# Patient Record
Sex: Male | Born: 1980
Health system: Southern US, Community
[De-identification: ages and names within clinical notes are randomized; demographics above are authoritative.]

## PROBLEM LIST (undated history)

## (undated) DIAGNOSIS — M549 Dorsalgia, unspecified: Secondary | ICD-10-CM

## (undated) DIAGNOSIS — M1990 Unspecified osteoarthritis, unspecified site: Secondary | ICD-10-CM

## (undated) DIAGNOSIS — M792 Neuralgia and neuritis, unspecified: Secondary | ICD-10-CM

## (undated) DIAGNOSIS — M9110 Juvenile osteochondrosis of head of femur [Legg-Calve-Perthes], unspecified leg: Secondary | ICD-10-CM

## (undated) DIAGNOSIS — E669 Obesity, unspecified: Secondary | ICD-10-CM

## (undated) DIAGNOSIS — E139 Other specified diabetes mellitus without complications: Secondary | ICD-10-CM

## (undated) HISTORY — PX: LEG SURGERY: SHX1003

---

## 2008-03-09 ENCOUNTER — Ambulatory Visit: Payer: Self-pay | Admitting: Radiology

## 2008-03-09 ENCOUNTER — Emergency Department (HOSPITAL_BASED_OUTPATIENT_CLINIC_OR_DEPARTMENT_OTHER): Admission: EM | Admit: 2008-03-09 | Discharge: 2008-03-09 | Payer: Self-pay | Admitting: Emergency Medicine

## 2008-03-20 ENCOUNTER — Emergency Department (HOSPITAL_BASED_OUTPATIENT_CLINIC_OR_DEPARTMENT_OTHER): Admission: EM | Admit: 2008-03-20 | Discharge: 2008-03-20 | Payer: Self-pay | Admitting: Emergency Medicine

## 2008-11-28 ENCOUNTER — Encounter: Admission: RE | Admit: 2008-11-28 | Discharge: 2009-02-26 | Payer: Self-pay

## 2009-05-01 ENCOUNTER — Emergency Department (HOSPITAL_BASED_OUTPATIENT_CLINIC_OR_DEPARTMENT_OTHER): Admission: EM | Admit: 2009-05-01 | Discharge: 2009-05-01 | Payer: Self-pay | Admitting: Emergency Medicine

## 2009-06-27 ENCOUNTER — Emergency Department (HOSPITAL_BASED_OUTPATIENT_CLINIC_OR_DEPARTMENT_OTHER): Admission: EM | Admit: 2009-06-27 | Discharge: 2009-06-27 | Payer: Self-pay | Admitting: Emergency Medicine

## 2010-01-11 ENCOUNTER — Emergency Department (HOSPITAL_BASED_OUTPATIENT_CLINIC_OR_DEPARTMENT_OTHER): Admission: EM | Admit: 2010-01-11 | Discharge: 2010-01-11 | Payer: Self-pay | Admitting: Emergency Medicine

## 2010-05-13 LAB — BASIC METABOLIC PANEL
GFR calc non Af Amer: >60 mL/min (ref >60)
Potassium: 3.8 mEq/L (ref 3.5–5.1)
Sodium: 141 mEq/L (ref 135–145)

## 2010-05-13 LAB — GLUCOSE, CAPILLARY: Glucose-Capillary: 285 mg/dL — ABNORMAL HIGH (ref 70–99)

## 2011-08-24 ENCOUNTER — Emergency Department (HOSPITAL_BASED_OUTPATIENT_CLINIC_OR_DEPARTMENT_OTHER)
Admission: EM | Admit: 2011-08-24 | Discharge: 2011-08-25 | Disposition: A | Discharge status: Home / Self Care | Payer: Self-pay | Attending: Emergency Medicine | Admitting: Emergency Medicine

## 2011-08-24 ENCOUNTER — Encounter (HOSPITAL_BASED_OUTPATIENT_CLINIC_OR_DEPARTMENT_OTHER): Payer: Self-pay | Admitting: *Deleted

## 2011-08-24 DIAGNOSIS — E119 Type 2 diabetes mellitus without complications: Secondary | ICD-10-CM | POA: Insufficient documentation

## 2011-08-24 DIAGNOSIS — E669 Obesity, unspecified: Secondary | ICD-10-CM | POA: Insufficient documentation

## 2011-08-24 DIAGNOSIS — W2209XA Striking against other stationary object, initial encounter: Secondary | ICD-10-CM | POA: Insufficient documentation

## 2011-08-24 DIAGNOSIS — Z881 Allergy status to other antibiotic agents status: Secondary | ICD-10-CM | POA: Insufficient documentation

## 2011-08-24 DIAGNOSIS — L089 Local infection of the skin and subcutaneous tissue, unspecified: Secondary | ICD-10-CM | POA: Insufficient documentation

## 2011-08-24 DIAGNOSIS — M1990 Unspecified osteoarthritis, unspecified site: Secondary | ICD-10-CM | POA: Insufficient documentation

## 2011-08-24 DIAGNOSIS — R739 Hyperglycemia, unspecified: Secondary | ICD-10-CM

## 2011-08-24 HISTORY — DX: Dorsalgia, unspecified: M54.9

## 2011-08-24 HISTORY — DX: Unspecified osteoarthritis, unspecified site: M19.90

## 2011-08-24 HISTORY — DX: Obesity, unspecified: E66.9

## 2011-08-24 HISTORY — DX: Other specified diabetes mellitus without complications: E13.9

## 2011-08-24 HISTORY — DX: Neuralgia and neuritis, unspecified: M79.2

## 2011-08-24 LAB — BASIC METABOLIC PANEL
BUN: 10 mg/dL (ref 6–23)
CO2: 27 mEq/L (ref 19–32)
Chloride: 94 mEq/L — ABNORMAL LOW (ref 96–112)
Creatinine, Ser: 0.6 mg/dL (ref 0.50–1.35)

## 2011-08-24 LAB — DIFFERENTIAL
Lymphocytes Relative: 20 % (ref 12–46)
Monocytes Absolute: 0.8 10*3/uL (ref 0.1–1.0)
Monocytes Relative: 8 % (ref 3–12)
Neutro Abs: 7.7 10*3/uL (ref 1.7–7.7)

## 2011-08-24 LAB — CBC
HCT: 38.8 % — ABNORMAL LOW (ref 39.0–52.0)
Hemoglobin: 13.6 g/dL (ref 13.0–17.0)
WBC: 10.7 10*3/uL — ABNORMAL HIGH (ref 4.0–10.5)

## 2011-08-24 MED ORDER — DEXTROSE 5 % IV SOLN
1.0000 g | INTRAVENOUS | Status: DC
  Administered 2011-08-24: 1 g via INTRAVENOUS
  Filled 2011-08-24: qty 10

## 2011-08-24 MED ORDER — CEPHALEXIN 500 MG PO CAPS: 500.0000 mg | ORAL_CAPSULE | Freq: Four times a day (QID) | ORAL | Status: AC

## 2011-08-24 MED ORDER — SODIUM CHLORIDE 0.9 % IV SOLN
Freq: Once | INTRAVENOUS | Status: AC
  Administered 2011-08-24: 22:00:00 via INTRAVENOUS

## 2011-08-24 MED ORDER — DOXYCYCLINE HYCLATE 100 MG PO CAPS: 100.0000 mg | ORAL_CAPSULE | Freq: Two times a day (BID) | ORAL | Status: AC

## 2011-08-24 MED ORDER — INSULIN REGULAR HUMAN 100 UNIT/ML IJ SOLN
10.0000 [IU] | Freq: Once | INTRAMUSCULAR | Status: AC
  Administered 2011-08-24: 10 [IU] via SUBCUTANEOUS

## 2011-08-24 MED ORDER — VANCOMYCIN HCL IN DEXTROSE 1-5 GM/200ML-% IV SOLN
1000.0000 mg | Freq: Once | INTRAVENOUS | Status: DC
  Filled 2011-08-24: qty 200

## 2011-08-24 MED ORDER — SODIUM CHLORIDE 0.9 % IV SOLN
Freq: Once | INTRAVENOUS | Status: AC
  Administered 2011-08-25: 1000 mL via INTRAVENOUS

## 2011-08-24 MED ORDER — INSULIN REGULAR HUMAN 100 UNIT/ML IJ SOLN
INTRAMUSCULAR | Status: AC
  Filled 2011-08-24: qty 10

## 2011-08-24 MED ORDER — ACETAMINOPHEN 325 MG PO TABS
650.0000 mg | ORAL_TABLET | Freq: Once | ORAL | Status: AC
Start: 2011-08-24 — End: 2011-08-24
  Administered 2011-08-24: 650 mg via ORAL
  Filled 2011-08-24: qty 2

## 2011-08-24 NOTE — ED Notes (Signed)
Pt report that he "skinned" the skin off his right great toe and believes that it is infected.  Pt reports fever today 102.0 and took 1gm tylenol today.  Pt also reports bilateral boils under both arms for about a month also.

## 2011-08-24 NOTE — Discharge Instructions (Signed)
Hyperglycemia Hyperglycemia occurs when the glucose (sugar) in your blood is too high. Hyperglycemia can happen for many reasons, but it most often happens to people who do not know they have diabetes or are not managing their diabetes properly.  CAUSES  Whether you have diabetes or not, there are other causes of hyperglycemia. Hyperglycemia can occur when you have diabetes, but it can also occur in other situations that you might not be as aware of, such as: Diabetes  If you have diabetes and are having problems controlling your blood glucose, hyperglycemia could occur because of some of the following reasons:   Not following your meal plan.   Not taking your diabetes medications or not taking it properly.   Exercising less or doing less activity than you normally do.   Being sick.  Pre-diabetes  This cannot be ignored. Before people develop Type 2 diabetes, they almost always have "pre-diabetes." This is when your blood glucose levels are higher than normal, but not yet high enough to be diagnosed as diabetes. Research has shown that some long-term damage to the body, especially the heart and circulatory system, may already be occurring during pre-diabetes. If you take action to manage your blood glucose when you have pre-diabetes, you may delay or prevent Type 2 diabetes from developing.  Stress  If you have diabetes, you may be "diet" controlled or on oral medications or insulin to control your diabetes. However, you may find that your blood glucose is higher than usual in the hospital whether you have diabetes or not. This is often referred to as "stress hyperglycemia." Stress can elevate your blood glucose. This happens because of hormones put out by the body during times of stress. If stress has been the cause of your high blood glucose, it can be followed regularly by your caregiver. That way he/she can make sure your hyperglycemia does not continue to get worse or progress to diabetes.   Steroids  Steroids are medications that act on the infection fighting system (immune system) to block inflammation or infection. One side effect can be a rise in blood glucose. Most people can produce enough extra insulin to allow for this rise, but for those who cannot, steroids make blood glucose levels go even higher. It is not unusual for steroid treatments to "uncover" diabetes that is developing. It is not always possible to determine if the hyperglycemia will go away after the steroids are stopped. A special blood test called an A1c is sometimes done to determine if your blood glucose was elevated before the steroids were started.  SYMPTOMS  Thirsty.   Frequent urination.   Dry mouth.   Blurred vision.   Tired or fatigue.   Weakness.   Sleepy.   Tingling in feet or leg.  DIAGNOSIS  Diagnosis is made by monitoring blood glucose in one or all of the following ways:  A1c test. This is a chemical found in your blood.   Fingerstick blood glucose monitoring.   Laboratory results.  TREATMENT  First, knowing the cause of the hyperglycemia is important before the hyperglycemia can be treated. Treatment may include, but is not be limited to:  Education.   Change or adjustment in medications.   Change or adjustment in meal plan.   Treatment for an illness, infection, etc.   More frequent blood glucose monitoring.   Change in exercise plan.   Decreasing or stopping steroids.   Lifestyle changes.  HOME CARE INSTRUCTIONS   Test your blood glucose as  directed.   Exercise regularly. Your caregiver will give you instructions about exercise. Pre-diabetes or diabetes which comes on with stress is helped by exercising.   Eat wholesome, balanced meals. Eat often and at regular, fixed times. Your caregiver or nutritionist will give you a meal plan to guide your sugar intake.   Being at an ideal weight is important. If needed, losing as little as 10 to 15 pounds may help  improve blood glucose levels.  SEEK MEDICAL CARE IF:   You have questions about medicine, activity, or diet.   You continue to have symptoms (problems such as increased thirst, urination, or weight gain).  SEEK IMMEDIATE MEDICAL CARE IF:   You are vomiting or have diarrhea.   Your breath smells fruity.   You are breathing faster or slower.   You are very sleepy or incoherent.   You have numbness, tingling, or pain in your feet or hands.   You have chest pain.   Your symptoms get worse even though you have been following your caregiver's orders.   If you have any other questions or concerns.  Document Released: 08/12/2000 Document Revised: 02/05/2011 Document Reviewed: 10/08/2008 [facility] Patient Information 2012 [place], [place].Skin Infections A skin infection usually develops as a result of disruption of the skin barrier.  CAUSES  A skin infection might occur following:  Trauma or an injury to the skin such as a cut or insect sting.   Inflammation (as in eczema).   Breaks in the skin between the toes (as in athlete's foot).   Swelling (edema).  SYMPTOMS  The legs are the most common site affected. Usually there is:  Redness.   Swelling.   Pain.   There may be red streaks in the area of the infection.  TREATMENT   Minor skin infections may be treated with topical antibiotics, but if the skin infection is severe, hospital care and intravenous (IV) antibiotic treatment may be needed.   Most often skin infections can be treated with oral antibiotic medicine as well as proper rest and elevation of the affected area until the infection improves.   If you are prescribed oral antibiotics, it is important to take them as directed and to take all the pills even if you feel better before you have finished all of the medicine.   You may apply warm compresses to the area for 20-30 minutes 4 times daily.  You might need a tetanus shot now if:  You have no idea when you had  the last one.   You have never had a tetanus shot before.   Your wound had dirt in it.  If you need a tetanus shot and you decide not to get one, there is a rare chance of getting tetanus. Sickness from tetanus can be serious. If you get a tetanus shot, your arm may swell and become red and warm at the shot site. This is common and not a problem. SEEK MEDICAL CARE IF:  The pain and swelling from your infection do not improve within 2 days.  SEEK IMMEDIATE MEDICAL CARE IF:  You develop a fever, chills, or other serious problems.  Document Released: 03/26/2004 Document Revised: 02/05/2011 Document Reviewed: 02/06/2008 [facility] Patient Information 2012 [place], [place].

## 2011-08-24 NOTE — ED Provider Notes (Signed)
History     CSN: [id number]  Arrival date & time 08/24/11  1850   First MD Initiated Contact with Patient 08/24/11 2114      Chief Complaint  Patient presents with  . Toe Injury  . Abscess    (Consider location/radiation/quality/duration/timing/severity/associated sxs/prior treatment) Patient is a 31 y.o. male presenting with lower extremity pain. The history is provided by the patient. No language interpreter was used.  Foot Pain This is a new problem. The current episode started in the past 7 days. The problem occurs constantly. The problem has been gradually worsening. Associated symptoms include a fever. Nothing aggravates the symptoms. He has tried nothing for the symptoms.  Pt reports he skinned his right 1st toe.   Pt reports he has had a fever today.  Pt is diabetic.  Pt reports he is on metformin.  Pt does not test his glucose because he can not afford strips.   Past Medical History  Diagnosis Date  . Obesity   . Neuropathic pain   . Diabetes 1.5, managed as type 1   . Back pain   . Osteoarthritis     History reviewed. No pertinent past surgical history.  History reviewed. No pertinent family history.  History  Substance Use Topics  . Smoking status: Never Smoker   . Smokeless tobacco: Not on file  . Alcohol Use: No      Review of Systems  Constitutional: Positive for fever.  Skin: Positive for wound.  All other systems reviewed and are negative.    Allergies  Sulfa antibiotics  Home Medications   Current Outpatient Rx  Name Route Sig Dispense Refill  . AMLODIPINE BESYLATE 10 MG PO TABS Oral Take 10 mg by mouth daily.    Marland Kitchen HYDROCHLOROTHIAZIDE 25 MG PO TABS Oral Take 25 mg by mouth daily.    . IBUPROFEN 800 MG PO TABS Oral Take 800 mg by mouth every 8 (eight) hours as needed. For pain    . METFORMIN HCL 850 MG PO TABS Oral Take 850 mg by mouth 2 (two) times daily with a meal.    . METOPROLOL TARTRATE 25 MG PO TABS Oral Take 25 mg by mouth 2 (two)  times daily.    . OXYCODONE HCL 30 MG PO TABS Oral Take 30 mg by mouth 5 (five) times daily. For pain    . PHENYLEPHRINE-DM-GG-APAP 5-10-200-325 MG PO TABS Oral Take 2 tablets by mouth every 4 (four) hours as needed. For fever and nausea    . TIZANIDINE HCL 4 MG PO TABS Oral Take 4 mg by mouth 4 (four) times daily.      BP 123/79  Pulse 86  Temp 101.2 F (38.4 C) (Oral)  Resp 20  SpO2 94%  Physical Exam  Nursing note and vitals reviewed. Constitutional: He appears well-developed and well-nourished.  HENT:  Head: Normocephalic and atraumatic.  Right Ear: External ear normal.  Left Ear: External ear normal.  Eyes: Pupils are equal, round, and reactive to light.  Neck: Normal range of motion.  Cardiovascular: Normal rate and normal heart sounds.   Pulmonary/Chest: Effort normal.  Musculoskeletal: He exhibits tenderness.       Open wound right 1st toe,  Multiple pustules under arms   Neurological: He is alert.  Skin: Skin is warm.    ED Course  Procedures (including critical care time)  Labs Reviewed  CBC - Abnormal; Notable for the following:    WBC 10.7 (*)     HCT 38.8 (*)  MCV 77.9 (*)     All other components within normal limits  BASIC METABOLIC PANEL - Abnormal; Notable for the following:    Sodium 133 (*)     Potassium 3.4 (*)     Chloride 94 (*)     Glucose, Bld 367 (*)     All other components within normal limits  DIFFERENTIAL   No results found.   1. Skin infection   2. Hyperglycemia       MDM  Pt given IV fluids, Rocephin, Vancomycin.   Pt has an elevated glucose to 367.   Pt given rx for clindamycin and keflex.   I advised pt to see his MD for recheck tommorow or return here for recheck.   Wound bandaged.   Pt given insulin 10 units subq        Lonia Skinner [place], [place] 08/24/11 2307  Lonia Skinner [place], [place] 08/24/11 [phone]

## 2011-08-24 NOTE — ED Provider Notes (Signed)
Medical screening examination/treatment/procedure(s) were performed by non-physician practitioner and as supervising physician I was immediately available for consultation/collaboration.   Makya Yurko, MD 08/24/11 2353 

## 2011-08-25 LAB — GLUCOSE, CAPILLARY: Glucose-Capillary: 300 mg/dL — ABNORMAL HIGH (ref 70–99)

## 2011-08-25 NOTE — ED Notes (Signed)
CBG - 300 reported to [employer]

## 2013-06-17 ENCOUNTER — Emergency Department: Payer: Self-pay | Admitting: Internal Medicine

## 2014-10-25 ENCOUNTER — Encounter (HOSPITAL_BASED_OUTPATIENT_CLINIC_OR_DEPARTMENT_OTHER): Payer: Self-pay | Admitting: *Deleted

## 2014-10-25 ENCOUNTER — Emergency Department (HOSPITAL_BASED_OUTPATIENT_CLINIC_OR_DEPARTMENT_OTHER): Payer: Self-pay

## 2014-10-25 ENCOUNTER — Emergency Department (HOSPITAL_BASED_OUTPATIENT_CLINIC_OR_DEPARTMENT_OTHER)
Admission: EM | Admit: 2014-10-25 | Discharge: 2014-10-25 | Disposition: A | Discharge status: Home / Self Care | Payer: Self-pay | Attending: Emergency Medicine | Admitting: Emergency Medicine

## 2014-10-25 DIAGNOSIS — Y9389 Activity, other specified: Secondary | ICD-10-CM | POA: Insufficient documentation

## 2014-10-25 DIAGNOSIS — W19XXXA Unspecified fall, initial encounter: Secondary | ICD-10-CM

## 2014-10-25 DIAGNOSIS — Y998 Other external cause status: Secondary | ICD-10-CM | POA: Insufficient documentation

## 2014-10-25 DIAGNOSIS — M1990 Unspecified osteoarthritis, unspecified site: Secondary | ICD-10-CM | POA: Insufficient documentation

## 2014-10-25 DIAGNOSIS — E109 Type 1 diabetes mellitus without complications: Secondary | ICD-10-CM | POA: Insufficient documentation

## 2014-10-25 DIAGNOSIS — Y92009 Unspecified place in unspecified non-institutional (private) residence as the place of occurrence of the external cause: Secondary | ICD-10-CM | POA: Insufficient documentation

## 2014-10-25 DIAGNOSIS — S43401A Unspecified sprain of right shoulder joint, initial encounter: Secondary | ICD-10-CM | POA: Insufficient documentation

## 2014-10-25 DIAGNOSIS — W010XXA Fall on same level from slipping, tripping and stumbling without subsequent striking against object, initial encounter: Secondary | ICD-10-CM | POA: Insufficient documentation

## 2014-10-25 DIAGNOSIS — E669 Obesity, unspecified: Secondary | ICD-10-CM | POA: Insufficient documentation

## 2014-10-25 DIAGNOSIS — Z79899 Other long term (current) drug therapy: Secondary | ICD-10-CM | POA: Insufficient documentation

## 2014-10-25 DIAGNOSIS — S8391XA Sprain of unspecified site of right knee, initial encounter: Secondary | ICD-10-CM | POA: Insufficient documentation

## 2014-10-25 HISTORY — DX: Juvenile osteochondrosis of head of femur (Legg-Calve-Perthes), unspecified leg: M91.10

## 2014-10-25 MED ORDER — MORPHINE SULFATE (PF) 4 MG/ML IV SOLN
6.0000 mg | Freq: Once | INTRAVENOUS | Status: AC
  Administered 2014-10-25: 6 mg via INTRAMUSCULAR
  Filled 2014-10-25: qty 2

## 2014-10-25 MED ORDER — METHOCARBAMOL 500 MG PO TABS: 1000.0000 mg | ORAL_TABLET | Freq: Four times a day (QID) | ORAL | Status: DC | PRN

## 2014-10-25 NOTE — ED Notes (Signed)
Tripped and fell this am. He landed from standing position onto concrete. Pain in his right shoulder, arm, knee, and hip. Pale and diaphoretic at triage. States he had someone drop him off.

## 2014-10-25 NOTE — Discharge Instructions (Signed)
For pain control you may take up to 800mg  of Motrin (also known as ibuprofen). That is usually 4 over the counter pills,  3 times a day. Take with food to minimize stomach irritation   You can also take  tylenol (acetaminophen) 975mg  (this is 3 over the counter pills) four times a day. Do not drink alcohol or combine with other medications that have acetaminophen as an ingredient (Read the labels!).    For breakthrough pain you may take Robaxin. Do not drink alcohol, drive or operate heavy machinery when taking Robaxin.  Only use the arm sling for up to 2 days. Take the arm out and rotate the shoulder every 4 hours.   Do not hesitate to return to the emergency room for any new, worsening or concerning symptoms.  Please obtain primary care using resource guide below. Let them know that you were seen in the emergency room and that they will need to obtain records for further outpatient management.    Emergency Department Resource Guide 1) Find a Doctor and Pay Out of Pocket Although you won't have to find out who is covered by your insurance plan, it is a good idea to ask around and get recommendations. You will then need to call the office and see if the doctor you have chosen will accept you as a new patient and what types of options they offer for patients who are self-pay. Some doctors offer discounts or will set up payment plans for their patients who do not have insurance, but you will need to ask so you aren't surprised when you get to your appointment.  2) Contact Your Local Health Department Not all health departments have doctors that can see patients for sick visits, but many do, so it is worth a call to see if yours does. If you don't know where your local health department is, you can check in your phone book. The CDC also has a tool to help you locate your state's health department, and many state websites also have listings of all of their local health departments.  3) Find a  Walk-in Clinic If your illness is not likely to be very severe or complicated, you may want to try a walk in clinic. These are popping up all over the country in pharmacies, drugstores, and shopping centers. They're usually staffed by nurse practitioners or physician assistants that have been trained to treat common illnesses and complaints. They're usually fairly quick and inexpensive. However, if you have serious medical issues or chronic medical problems, these are probably not your best option.  No Primary Care Doctor: - Call Health Connect at  [phone] - they can help you locate a primary care doctor that  accepts your insurance, provides certain services, etc. - Physician Referral Service- [phone]  Chronic Pain Problems: Organization         Address  Phone   Notes  [facility]  [phone] Patients need to be referred by their primary care doctor.   Medication Assistance: Organization         Address  Phone   Notes  [facility] Medication [facility] [address] [place] [place], [place] [address] [phone] --Must be a resident of [facility] -- Must have NO insurance coverage whatsoever (no Medicaid/ Medicare, etc.) -- The pt. MUST have a primary care doctor that directs their care regularly and follows them in the community   [facility]  [phone]   [address]  (  [phone]    Agencies that provide inexpensive medical care: Organization         Address  Phone   Notes  [facility]  [phone]   [facility]    [phone]   [facility] [address] [place], [place] [address] [phone]   [facility] [address] [place], [place] [phone]   Planned Parenthood    [phone]   [facility]    [phone]   [facility] and [facility]  [address], [place] Phone:  [phone], Fax:  [phone] Hours of Operation:  9 am - 6 pm, M-F.  Also accepts Medicaid/Medicare and self-pay.  [facility]  [address], [place] Phone: [phone], Fax: [phone]. Hours of Operation:  8:30 am - 5:30 pm, M-F.  Also accepts Medicaid and self-pay.  [facility] [place] [address] [place] Phone: [phone]   [facility] [address] Natasha Bence [place], [place] [phone], Ext. [phone] Mondays & Thursdays: 7-9 AM.  First 15 patients are seen on a first come, first serve basis.    Medicaid-accepting [facility] Providers:  Organization         Address  Phone   Notes  [facility] [address], [place] [phone] Also accepts self-pay patients.  [facility] [address] Laurell Josephs [place], [place]  [phone]   [facility] [address], [place] [phone]   [facility] [address] [place] [phone]   Renaye Rakers [address], [place]   [phone] Only accepts [place] Access [place] patients after they have their name applied to their card.   Self-Pay (no insurance) in [facility]:  Organization         Address  Phone   Notes  Sickle Cell Patients, [facility] [address] [place], [place] [phone]   [facility] [address] [place], [place] [phone]   [facility] [place]  [address], [place] [phone]   [facility]/Dr. Osei-Bonsu  [address] [place] or [address], [place] [phone] Phone number for both [place] and [place] locations is the same.  [facility]gent [facility] and [facility] [address] [place] [phone]   [facility] [address] [place] or [address] [phone]     [facility] [address] [place] [phone], phone; [phone], fax Sees patients 1st and 3rd Saturday of every month.  Must not qualify for public or private insurance (i.e. Medicaid, Medicare, [place] Health Choice, Veterans' Benefits)  Household income should be no more than 200% of the poverty level The clinic cannot treat you if you are pregnant or think you are pregnant  Sexually transmitted diseases are not treated at the clinic.    Dental Care: Organization         Address  Phone  Notes  [facility] [address] [place], [place] [phone] Accepts children up to age 54 who are enrolled in [place] or [place] Health Choice; pregnant women with a Medicaid card; and children  who have applied for Medicaid or [place] Health Choice, but were declined, whose parents can pay a reduced fee at time of service.  [facility] Department of [facility]  [address], [place] [phone] Accepts children up to age 74 who are enrolled in [place] or [place] Health Choice; pregnant women with a Medicaid card; and children who have applied for Medicaid or [place] Health Choice, but were declined, whose parents can pay a reduced fee at time of service.  [facility]  [address] [place], [place] [phone] Patients are seen by appointment only. Walk-ins are not accepted. [facility] will see patients 11 years of age and older. Monday - Tuesday (8am-5pm) Most Wednesdays (8:30-5pm) $30 per visit, cash only  [facility] Adult Dental Access [facility]  [address], [facility] [phone] Patients are seen by appointment only. Walk-ins are not accepted. [facility] will see patients 4 years of age and older. One Wednesday Evening (Monthly: Volunteer Based).  $30 per visit, cash only  [employer]  [phone] for adults; Children under age 22, call  [facility] at [phone]. Children aged 63-14, please call [phone] to request a pediatric application.  Dental services are provided in all areas of dental care including fillings, crowns and bridges, complete and partial dentures, implants, gum treatment, root canals, and extractions. Preventive care is also provided. Treatment is provided to both adults and children. Patients are selected via a lottery and there is often a waiting list.   [facility] [address], [place]  [phone] [email or web address]   [facility] [address] [place], [place] [phone], Ext. [phone] Second and Fourth Thursday of each month, opens at 6:30 AM; Clinic ends at 9 AM.  Patients are seen on a first-come first-served basis, and a limited number are seen during each clinic.   [facility]  [address] Ether Griffins [place], [place] [phone]   Eligibility Requirements You must have lived in [place], [place], or [place] counties for at least the last three months.   You cannot be eligible for state or federal sponsored [place], including [employer], [place], or [employer].   You generally cannot be eligible for healthcare insurance through your employer.    How to apply: Eligibility screenings are held every Tuesday and Wednesday afternoon from 1:00 pm until 4:00 pm. You do not need an appointment for the interview!  [facility] [address], [place], [place] [phone]   [facility] Health Department  [phone]   [facility] Health Department  [phone]   [facility] Department  [phone]    Behavioral Health Resources in the Community: Intensive Outpatient Programs Organization         Address  Phone  Notes  [facility] [address], [place], [place] [phone]   [facility] Outpatient [address], [place], [place]  [phone]   [facility] [address], [place], [place]  [phone]   [facility] Mental Health [address]  [place], [place] [phone] or [phone]   Substance Abuse Resources Organization         Address  Phone  Notes  [facility]  [phone]   [facility]  [phone]   [facility] [place] [facility]  [phone]   [facility]  [phone]   Residential &  Outpatient Substance Abuse Program  [phone]   Psychological Services Organization         Address  Phone  Notes  [facility] Behavioral Health  [phone]   [facility]  [phone]   [facility] [phone] [address] [place] [phone] or [phone]    Mobile Crisis Teams Organization         Address  Phone  Notes  [facility], Mobile Crisis Care Unit  [phone]   [facility]  [address] [place], [place] [phone]   Doristine Locks [address] [place] [phone]    Self-Help/Support Groups Organization         Address  Phone             Notes  [facility] [place] - variety of support groups  [phone]- [phone] Call for more information  Narcotics Anonymous (NA), [facility] [address], [employer] [place]  2 meetings at this location   Statistician         Address  Phone  Notes  [facility] [address],    [place]  [phone]   [facility]  [address], [place] [address], [place], [place] [phone]   [facility] [address] [place], [place] [phone] Admissions: 8am-3pm M-F  [facility] [address],    [place], [place] [phone]   [facility] [address] [place], [place], [place] [phone]   [facility] [address]  [place], [place] [phone]   [facility] - Intensive Outpatient [address] Laurell Josephs [address], [place], [place] [phone]   [facility] ([facility] [address] [place].,  [place], [place] [phone] or [phone]   [facility] ([facility]) [address], [place], [place] [phone] Accepts Medicaid  [facility] [address],  [place] [phone] Substance Abuse/Addiction Treatment   [facility] Organization         Address  Phone  Notes  [facility]  [phone]   Angie Fava, PhD [address] Ervin Knack [place], [place]   [phone] or [phone]   [facility] Behavioral   [address] [place], [place] [phone]   [facility] [address] [place], [place] [phone] Insurance/Medicaid/sponsorship through [facility] and Families [address]                                    [place], [place] [phone] Therapy/tele-psych/case  [facility] [address], [place] [phone]    Dr. Lolly Mustache  [phone]   [facility]  [employer] [facility] Dept. 1) [address] [place] 2) [address] [place] 3)  [address] [place] [address], [place] [phone]  [phone]   [facility] Child Abuse Hotline [phone] or [phone] (After Hours)

## 2014-10-25 NOTE — ED Notes (Signed)
Dopplered pt pulses in right foot without difficulty pedal and posterior tibia pulses. PA aware.

## 2014-10-25 NOTE — ED Provider Notes (Signed)
CSN: [id number]     Arrival date & time 10/25/14  1159 History   None    Chief Complaint  Patient presents with  . Fall     (Consider location/radiation/quality/duration/timing/severity/associated sxs/prior Treatment) HPI   Blood pressure 130/71, pulse 62, temperature 98.2 F (36.8 C), temperature source Oral, resp. rate 18, height 6' (1.829 m), weight 340 lb (154.223 kg), SpO2 98 %.  Zachary Graves is a 34 y.o. male complaining of pain to right (dominant) shoulder,  elbow, wrist, right knee and right hip status post slip and fall at home. Patient states that he slipped over a cord and fell onto concrete. She denies head trauma, cervicalgia, chest pain, shortness of breath, abdominal pain. He's been able to ambulate without issue. Patient states that he is very hot natured and is sweating profusely. He takes 30 mg Roxicodone for his chronic back pain. Patient also has chronic left hip pain from leg calf Perthes.  Past Medical History  Diagnosis Date  . Obesity   . Neuropathic pain   . Diabetes 1.5, managed as type 1   . Back pain   . Osteoarthritis   . Legg-Calve-Perthes disease    History reviewed. No pertinent past surgical history. No family history on file. Social History  Substance Use Topics  . Smoking status: Never Smoker   . Smokeless tobacco: None  . Alcohol Use: No    Review of Systems  10 systems reviewed and found to be negative, except as noted in the HPI.   Allergies  Sulfa antibiotics  Home Medications   Prior to Admission medications   Medication Sig Start Date End Date Taking? Authorizing Provider  ATENOLOL PO Take by mouth.   Yes Historical Provider, MD  amLODipine (NORVASC) 10 MG tablet Take 10 mg by mouth daily.    Historical Provider, MD  hydrochlorothiazide (HYDRODIURIL) 25 MG tablet Take 25 mg by mouth daily.    Historical Provider, MD  ibuprofen (ADVIL,MOTRIN) 800 MG tablet Take 800 mg by mouth every 8 (eight) hours as needed. For pain     Historical Provider, MD  metFORMIN (GLUCOPHAGE) 850 MG tablet Take 850 mg by mouth 2 (two) times daily with a meal.    Historical Provider, MD  methocarbamol (ROBAXIN) 500 MG tablet Take 2 tablets (1,000 mg total) by mouth 4 (four) times daily as needed (Pain). 10/25/14   Dianara Smullen, PA-C  metoprolol tartrate (LOPRESSOR) 25 MG tablet Take 25 mg by mouth 2 (two) times daily.    Historical Provider, MD  oxycodone (ROXICODONE) 30 MG immediate release tablet Take 30 mg by mouth 5 (five) times daily. For pain    Historical Provider, MD  Phenylephrine-DM-GG-APAP (TYLENOL COLD/FLU SEVERE) 5-10-200-325 MG TABS Take 2 tablets by mouth every 4 (four) hours as needed. For fever and nausea    Historical Provider, MD  tiZANidine (ZANAFLEX) 4 MG tablet Take 4 mg by mouth 4 (four) times daily.    Historical Provider, MD   BP 130/71 mmHg  Pulse 62  Temp(Src) 98.2 F (36.8 C) (Oral)  Resp 18  Ht 6' (1.829 m)  Wt 340 lb (154.223 kg)  BMI 46.10 kg/m2  SpO2 98% Physical Exam  Constitutional: He is oriented to person, place, and time. He appears well-developed and well-nourished. No distress.  obese, sweating profusely  HENT:  Head: Normocephalic and atraumatic.  Mouth/Throat: Oropharynx is clear and moist.  No abrasions or contusions.   No hemotympanum, battle signs or raccoon's eyes  No crepitance or tenderness to  palpation along the orbital rim.  EOMI intact with no pain or diplopia  No abnormal otorrhea or rhinorrhea. Nasal septum midline.  No intraoral trauma.  Eyes: Conjunctivae and EOM are normal. Pupils are equal, round, and reactive to light.  Neck: Normal range of motion. Neck supple.  No midline C-spine  tenderness to palpation or step-offs appreciated. Patient has full range of motion without pain.  Grip/Biceps/Tricep strength 5/5 bilaterally, sensation to UE intact bilaterally.    Cardiovascular: Normal rate, regular rhythm and intact distal pulses.   Pulmonary/Chest: Effort  normal and breath sounds normal. No stridor. No respiratory distress. He has no wheezes. He has no rales. He exhibits no tenderness.  Abdominal: Soft. Bowel sounds are normal. He exhibits no distension and no mass. There is no tenderness. There is no rebound and no guarding.  Musculoskeletal: Normal range of motion. He exhibits tenderness. He exhibits no edema.  Right arm with no deformity, strong radial pulse can wiggle fingers without issue, he has good range of motion to shoulder and elbow. He is diffusely tender to palpation shoulder and elbow. Patient reports complete anesthesia to right fourth and fifth digits.   Right knee: No deformity, erythema or abrasions. FROM. No effusion or crepitance. Anterior and posterior drawer show no abnormal laxity. Stable to valgus and varus stress. Joint lines are non-tender. Neurovascularly intact. Pt ambulatory.   Diabetic ulcer to right great toe.   Neurological: He is alert and oriented to person, place, and time.  Strength 5/5 x4 extremities   Distal sensation intact  Skin: Skin is warm.  Psychiatric: He has a normal mood and affect.  Nursing note and vitals reviewed.   ED Course  Procedures (including critical care time) Labs Review Labs Reviewed - No data to display  Imaging Review Dg Shoulder Right  10/25/2014   CLINICAL DATA:  Right shoulder pain, status post fall on concrete.  EXAM: RIGHT SHOULDER - 2+ VIEW  COMPARISON:  None.  FINDINGS: There is no evidence of fracture or dislocation. There is no evidence of arthropathy or other focal bone abnormality. Soft tissues are unremarkable.  IMPRESSION: No acute osseous abnormality identified.   Electronically Signed   By: Ted Mcalpine M.D.   On: 10/25/2014 13:07   Dg Elbow Complete Right  10/25/2014   CLINICAL DATA:  Right elbow pain status post falling on concrete.  EXAM: RIGHT ELBOW - COMPLETE 3+ VIEW  COMPARISON:  None.  FINDINGS: There is no evidence of fracture, dislocation, or joint  effusion. There is no evidence of arthropathy or other focal bone abnormality. Small well corticated calcification, likely representing an enthesophyte is noted off of the medial proximal ulnar shaft. Soft tissues are unremarkable.  IMPRESSION: No acute osseous abnormality identified.   Electronically Signed   By: Ted Mcalpine M.D.   On: 10/25/2014 13:09   Dg Wrist Complete Right  10/25/2014   CLINICAL DATA:  Acute right wrist pain after fall today. Initial encounter.  EXAM: RIGHT WRIST - COMPLETE 3+ VIEW  COMPARISON:  None.  FINDINGS: There is no evidence of fracture or dislocation. There is no evidence of arthropathy or other focal bone abnormality. Soft tissues are unremarkable.  IMPRESSION: Normal right wrist.   Electronically Signed   By: Lupita Raider, M.D.   On: 10/25/2014 13:10   Dg Knee Complete 4 Views Right  10/25/2014   CLINICAL DATA:  Patient status post fall today on concrete.  EXAM: RIGHT KNEE - COMPLETE 4+ VIEW  COMPARISON:  Radiograph  06/17/2013  FINDINGS: Tricompartmental osteoarthritis. Medial compartment joint space narrowing. No acute osseous abnormality. No evidence for acute fracture. No definite joint effusion.  IMPRESSION: Degenerative changes.  No definite acute osseous abnormality.   Electronically Signed   By: Annia Belt M.D.   On: 10/25/2014 13:12   Dg Hips Bilat With Pelvis 3-4 Views  10/25/2014   CLINICAL DATA:  Bilateral hip pain after falling on concrete today.  EXAM: DG HIP (WITH OR WITHOUT PELVIS) 3-4V BILAT  COMPARISON:  None.  FINDINGS: There is mild deformity of the left femoral head. No fracture or dislocation seen.  IMPRESSION: Old, healed left slipped capital femoral epiphysis. No acute fracture or dislocation.   Electronically Signed   By: Beckie Salts M.D.   On: 10/25/2014 13:13   I have personally reviewed and evaluated these images and lab results as part of my medical decision-making.   EKG Interpretation None      MDM   Final diagnoses:   Fall at home, initial encounter  Shoulder sprain, right, initial encounter  Right knee sprain, initial encounter    Filed Vitals:   10/25/14 1209  BP: 130/71  Pulse: 62  Temp: 98.2 F (36.8 C)  TempSrc: Oral  Resp: 18  Height: 6' (1.829 m)  Weight: 340 lb (154.223 kg)  SpO2: 98%    Medications  morphine 4 MG/ML injection 6 mg (6 mg Intramuscular Given 10/25/14 1227)    Zachary Graves is a pleasant 34 y.o. male presenting with diffuse right arm bilateral hip and right knee pain status post slip and fall. There was no head trauma. Strong pulses with excellent range of motion to all affected joints. Plain films with no acute abnormality. Patient will be given a shoulder sling and muscle relaxers. I've encouraged him to follow closely with both his pain management specialist and sports medicine  Evaluation does not show pathology that would require ongoing emergent intervention or inpatient treatment. Pt is hemodynamically stable and mentating appropriately. Discussed findings and plan with patient/guardian, who agrees with care plan. All questions answered. Return precautions discussed and outpatient follow up given.   New Prescriptions   METHOCARBAMOL (ROBAXIN) 500 MG TABLET    Take 2 tablets (1,000 mg total) by mouth 4 (four) times daily as needed (Pain).         Wynetta Emery, PA-C 10/25/14 1326  Gwyneth Sprout, MD 10/26/14 1017

## 2015-05-06 MED FILL — oxyCODONE HCL 30 MG TABS: 30 | 30 days supply | Qty: 120 | Fill #0

## 2017-01-08 ENCOUNTER — Encounter (HOSPITAL_BASED_OUTPATIENT_CLINIC_OR_DEPARTMENT_OTHER): Payer: Self-pay

## 2017-01-08 ENCOUNTER — Emergency Department (HOSPITAL_BASED_OUTPATIENT_CLINIC_OR_DEPARTMENT_OTHER): Payer: Medicaid Other

## 2017-01-08 ENCOUNTER — Other Ambulatory Visit: Payer: Self-pay

## 2017-01-08 ENCOUNTER — Emergency Department (HOSPITAL_BASED_OUTPATIENT_CLINIC_OR_DEPARTMENT_OTHER)
Admission: EM | Admit: 2017-01-08 | Discharge: 2017-01-08 | Disposition: A | Discharge status: Home / Self Care | Payer: Medicaid Other | Attending: Emergency Medicine | Admitting: Emergency Medicine

## 2017-01-08 DIAGNOSIS — Z79899 Other long term (current) drug therapy: Secondary | ICD-10-CM | POA: Insufficient documentation

## 2017-01-08 DIAGNOSIS — J069 Acute upper respiratory infection, unspecified: Secondary | ICD-10-CM | POA: Insufficient documentation

## 2017-01-08 DIAGNOSIS — R0602 Shortness of breath: Secondary | ICD-10-CM | POA: Diagnosis present

## 2017-01-08 DIAGNOSIS — E1065 Type 1 diabetes mellitus with hyperglycemia: Secondary | ICD-10-CM | POA: Diagnosis not present

## 2017-01-08 DIAGNOSIS — R739 Hyperglycemia, unspecified: Secondary | ICD-10-CM

## 2017-01-08 LAB — CBC WITH DIFFERENTIAL/PLATELET
Basophils Absolute: 0 10*3/uL (ref 0.0–0.1)
Basophils Relative: 0 %
EOS PCT: 2 %
Eosinophils Absolute: 0.2 10*3/uL (ref 0.0–0.7)
HCT: 46.5 % (ref 39.0–52.0)
Hemoglobin: 16.4 g/dL (ref 13.0–17.0)
LYMPHS ABS: 3.4 10*3/uL (ref 0.7–4.0)
LYMPHS PCT: 28 %
MCH: 27.8 pg (ref 26.0–34.0)
MCHC: 35.3 g/dL (ref 30.0–36.0)
MCV: 78.8 fL (ref 78.0–100.0)
MONO ABS: 0.6 10*3/uL (ref 0.1–1.0)
MONOS PCT: 5 %
Neutro Abs: 8 10*3/uL — ABNORMAL HIGH (ref 1.7–7.7)
Neutrophils Relative %: 65 %
PLATELETS: 316 10*3/uL (ref 150–400)
RBC: 5.9 MIL/uL — ABNORMAL HIGH (ref 4.22–5.81)
RDW: 13.6 % (ref 11.5–15.5)
WBC: 12.1 10*3/uL — ABNORMAL HIGH (ref 4.0–10.5)

## 2017-01-08 LAB — BASIC METABOLIC PANEL
Anion gap: 14 (ref 5–15)
BUN: 14 mg/dL (ref 6–20)
CHLORIDE: 96 mmol/L — AB (ref 101–111)
CO2: 20 mmol/L — ABNORMAL LOW (ref 22–32)
Calcium: 9.5 mg/dL (ref 8.9–10.3)
Creatinine, Ser: 0.94 mg/dL (ref 0.61–1.24)
GFR calc Af Amer: >60 mL/min (ref >60)
GLUCOSE: 451 mg/dL — AB (ref 65–99)
POTASSIUM: 3.8 mmol/L (ref 3.5–5.1)
Sodium: 130 mmol/L — ABNORMAL LOW (ref 135–145)

## 2017-01-08 LAB — CBG MONITORING, ED: Glucose-Capillary: 319 mg/dL — ABNORMAL HIGH (ref 65–99)

## 2017-01-08 MED ORDER — SODIUM CHLORIDE 0.9 % IV BOLUS (SEPSIS)
1000.0000 mL | Freq: Once | INTRAVENOUS | Status: AC
  Administered 2017-01-08: 1000 mL via INTRAVENOUS

## 2017-01-08 MED ORDER — OXYCODONE-ACETAMINOPHEN 5-325 MG PO TABS
2.0000 | ORAL_TABLET | Freq: Once | ORAL | Status: AC
  Administered 2017-01-08: 2 via ORAL
  Filled 2017-01-08: qty 2

## 2017-01-08 MED ORDER — ONDANSETRON 4 MG PO TBDP: 4.0000 mg | ORAL_TABLET | Freq: Three times a day (TID) | ORAL | 0 refills | Status: AC | PRN

## 2017-01-08 MED ORDER — INSULIN REGULAR HUMAN 100 UNIT/ML IJ SOLN
10.0000 [IU] | Freq: Once | INTRAMUSCULAR | Status: AC
  Administered 2017-01-08: 10 [IU] via SUBCUTANEOUS
  Filled 2017-01-08: qty 1

## 2017-01-08 MED ORDER — ONDANSETRON HCL 4 MG/2ML IJ SOLN
4.0000 mg | Freq: Once | INTRAMUSCULAR | Status: AC
Start: 2017-01-08 — End: 2017-01-08
  Administered 2017-01-08: 4 mg via INTRAVENOUS
  Filled 2017-01-08: qty 2

## 2017-01-08 NOTE — ED Notes (Signed)
Alert, NAD, calm, interactive, resps e/u, speaking in clear complete sentences, no dyspnea noted, skin W&D, VSS, mentions pain, sob, nausea, cold chills, anorexia, (at this time denies: nausea, sob,  dizziness or visual changes). [facility]  [facility] bol[facility] finishing.

## 2017-01-08 NOTE — ED Provider Notes (Signed)
[facility] EMERGENCY DEPARTMENT Provider Note   CSN: [id number] Arrival date & time: 01/08/17  1626     History   Chief Complaint Chief Complaint  Patient presents with  . Cough    HPI Zachary Graves is a 36 y.o. male.  HPI Patient presents with cough shortness of breath and chills over the last week.  She had a cough with minimal production.  No nausea vomiting diarrhea.  States he has had 2 other friends have had similar symptoms.  States that there is symptoms had lasted 2 weeks.  States it feels weak all over.  He is diaphoretic.  He is diabetic.  Has bilateral foot drop states he went in DKA and was passed out on the toilet for hours and hurt his sciatic nerves.  States his sugars have been only up to 150 at home.  States he gets insulin if his sugars go above 150. Past Medical History:  Diagnosis Date  . Back pain   . Diabetes 1.5, managed as type 1 (HCC)   . Legg-Calve-Perthes disease   . Neuropathic pain   . Obesity   . Osteoarthritis     There are no active problems to display for this patient.   Past Surgical History:  Procedure Laterality Date  . LEG SURGERY         Home Medications    Prior to Admission medications   Medication Sig Start Date End Date Taking? Authorizing Provider  OxyCODONE HCl (OXYCONTIN PO) Take by mouth.   Yes [provider]  ATENOLOL PO Take by mouth.    [provider]  ibuprofen (ADVIL,MOTRIN) 800 MG tablet Take 800 mg by mouth every 8 (eight) hours as needed. For pain    [provider]  ondansetron (ZOFRAN-ODT) 4 MG disintegrating tablet Take 1 tablet (4 mg total) every 8 (eight) hours as needed by mouth for nausea or vomiting. 01/08/17   Benjiman CorePickering, Gunnard Dorrance, MD  oxycodone (ROXICODONE) 30 MG immediate release tablet Take 30 mg by mouth 5 (five) times daily. For pain    [provider]  Phenylephrine-DM-GG-APAP (TYLENOL COLD/FLU SEVERE) 5-10-200-325 MG TABS Take 2 tablets by mouth every  4 (four) hours as needed. For fever and nausea    [provider]  tiZANidine (ZANAFLEX) 4 MG tablet Take 4 mg by mouth 4 (four) times daily.    [provider]    Family History No family history on file.  Social History Social History   Tobacco Use  . Smoking status: Never Smoker  . Smokeless tobacco: Never Used  Substance Use Topics  . Alcohol use: No  . Drug use: No     Allergies   Naproxen and Sulfa antibiotics   Review of Systems Review of Systems  Constitutional: Positive for appetite change and diaphoresis.  HENT: Positive for congestion. Negative for sore throat.   Respiratory: Positive for cough and shortness of breath.   Cardiovascular: Negative for chest pain.  Gastrointestinal: Negative for abdominal pain.  Endocrine: Negative for polyphagia and polyuria.  Genitourinary: Negative for flank pain.  Musculoskeletal: Negative for back pain.  Neurological: Negative for weakness.  Psychiatric/Behavioral: Negative for confusion.     Physical Exam Updated Vital Signs BP (!) 142/85 (BP Location: Left Arm)   Pulse (!) 114   Temp 98.7 F (37.1 C) (Oral)   Resp 20   Ht 6' (1.829 m)   Wt (!) 145.8 kg (321 lb 6.9 oz)   SpO2 100%   BMI 43.59  kg/m   Physical Exam  Constitutional: He appears well-developed.  Patient is obese  HENT:  Head: Atraumatic.  Eyes: Pupils are equal, round, and reactive to light.  Neck: Neck supple.  Cardiovascular:  Tachycardia  Pulmonary/Chest: Effort normal. No stridor. He has no wheezes. He has no rales.  Abdominal: There is no tenderness. There is no guarding.  Musculoskeletal: He exhibits no edema.  Neurological: He is alert.  Skin: Skin is warm. Capillary refill takes less than 2 seconds. He is diaphoretic.  Psychiatric: He has a normal mood and affect.     ED Treatments / Results  Labs (all labs ordered are listed, but only abnormal results are displayed) Labs Reviewed  BASIC METABOLIC PANEL -  Abnormal; Notable for the following components:      Result Value   Sodium 130 (*)    Chloride 96 (*)    CO2 20 (*)    Glucose, Bld 451 (*)    All other components within normal limits  CBC WITH DIFFERENTIAL/PLATELET - Abnormal; Notable for the following components:   WBC 12.1 (*)    RBC 5.90 (*)    Neutro Abs 8.0 (*)    All other components within normal limits  CBG MONITORING, ED - Abnormal; Notable for the following components:   Glucose-Capillary 319 (*)    All other components within normal limits    EKG  EKG Interpretation None       Radiology Dg Chest 2 View  Result Date: 01/08/2017 CLINICAL DATA:  Cough for 1 week, went away for few days but now is back since yesterday, fatigue, loss of appetite, diabetes mellitus EXAM: CHEST  2 VIEW COMPARISON:  11/20/2014 FINDINGS: Normal heart size, mediastinal contours, and pulmonary vascularity. Azygos fissure noted. Lungs clear. No pleural effusion or pneumothorax. Bones unremarkable. IMPRESSION: No acute abnormalities. Electronically Signed   By: Ulyses Southward M.D.   On: 01/08/2017 17:20    Procedures Procedures (including critical care time)  Medications Ordered in ED Medications  sodium chloride 0.9 % bolus 1,000 mL (0 mLs Intravenous Stopped 01/08/17 1823)  sodium chloride 0.9 % bolus 1,000 mL (1,000 mLs Intravenous New Bag/Given 01/08/17 1855)  insulin regular (NOVOLIN R,HUMULIN R) 100 units/mL injection 10 Units (10 Units Subcutaneous Given 01/08/17 1851)  oxyCODONE-acetaminophen (PERCOCET/ROXICET) 5-325 MG per tablet 2 tablet (2 tablets Oral Given 01/08/17 1907)  ondansetron (ZOFRAN) injection 4 mg (4 mg Intravenous Given 01/08/17 1938)     Initial Impression / Assessment and Plan / ED Course  I have reviewed the triage vital signs and the nursing notes.  Pertinent labs & imaging results that were available during my care of the patient were reviewed by me and considered in my medical decision making (see chart for  details).     Patient with URI symptoms.  No pneumonia on x-ray.  Hyperglycemia has improved with treatment.  Not in DKA.  Is tachycardic but states he did not take his atenolol today.  Feels as if he can manage this at home.  Will give Zofran for his nausea.  Will discharge home.  Final Clinical Impressions(s) / ED Diagnoses   Final diagnoses:  Acute upper respiratory infection  Hyperglycemia    ED Discharge Orders        Ordered    ondansetron (ZOFRAN-ODT) 4 MG disintegrating tablet  Every 8 hours PRN     01/08/17 2011       Benjiman Core, MD 01/08/17 2012

## 2017-01-08 NOTE — ED Triage Notes (Signed)
C/o flu like sx x 1 week-NAD-presents to triage in w/c

## 2017-03-10 ENCOUNTER — Emergency Department: Payer: Medicaid Other

## 2017-03-10 ENCOUNTER — Other Ambulatory Visit: Payer: Self-pay

## 2017-03-10 ENCOUNTER — Inpatient Hospital Stay
Admission: EM | Admit: 2017-03-10 | Discharge: 2017-04-02 | DRG: 870 | Disposition: E | Payer: Medicaid Other | Attending: Internal Medicine | Admitting: Internal Medicine

## 2017-03-10 ENCOUNTER — Inpatient Hospital Stay: Payer: Medicaid Other

## 2017-03-10 DIAGNOSIS — M9110 Juvenile osteochondrosis of head of femur [Legg-Calve-Perthes], unspecified leg: Secondary | ICD-10-CM | POA: Diagnosis present

## 2017-03-10 DIAGNOSIS — R402123 Coma scale, eyes open, to pain, at hospital admission: Secondary | ICD-10-CM | POA: Diagnosis present

## 2017-03-10 DIAGNOSIS — D649 Anemia, unspecified: Secondary | ICD-10-CM | POA: Diagnosis present

## 2017-03-10 DIAGNOSIS — R748 Abnormal levels of other serum enzymes: Secondary | ICD-10-CM | POA: Diagnosis present

## 2017-03-10 DIAGNOSIS — G253 Myoclonus: Secondary | ICD-10-CM | POA: Diagnosis not present

## 2017-03-10 DIAGNOSIS — I959 Hypotension, unspecified: Secondary | ICD-10-CM

## 2017-03-10 DIAGNOSIS — Z4659 Encounter for fitting and adjustment of other gastrointestinal appliance and device: Secondary | ICD-10-CM

## 2017-03-10 DIAGNOSIS — J90 Pleural effusion, not elsewhere classified: Secondary | ICD-10-CM | POA: Diagnosis present

## 2017-03-10 DIAGNOSIS — K7290 Hepatic failure, unspecified without coma: Secondary | ICD-10-CM | POA: Diagnosis not present

## 2017-03-10 DIAGNOSIS — Z66 Do not resuscitate: Secondary | ICD-10-CM | POA: Diagnosis not present

## 2017-03-10 DIAGNOSIS — R6521 Severe sepsis with septic shock: Secondary | ICD-10-CM | POA: Diagnosis present

## 2017-03-10 DIAGNOSIS — I6782 Cerebral ischemia: Secondary | ICD-10-CM | POA: Diagnosis not present

## 2017-03-10 DIAGNOSIS — E875 Hyperkalemia: Secondary | ICD-10-CM | POA: Diagnosis present

## 2017-03-10 DIAGNOSIS — J9600 Acute respiratory failure, unspecified whether with hypoxia or hypercapnia: Secondary | ICD-10-CM

## 2017-03-10 DIAGNOSIS — R402364 Coma scale, best motor response, obeys commands, 24 hours or more after hospital admission: Secondary | ICD-10-CM | POA: Diagnosis not present

## 2017-03-10 DIAGNOSIS — N170 Acute kidney failure with tubular necrosis: Secondary | ICD-10-CM | POA: Diagnosis present

## 2017-03-10 DIAGNOSIS — E1110 Type 2 diabetes mellitus with ketoacidosis without coma: Secondary | ICD-10-CM | POA: Diagnosis present

## 2017-03-10 DIAGNOSIS — G8929 Other chronic pain: Secondary | ICD-10-CM | POA: Diagnosis present

## 2017-03-10 DIAGNOSIS — N179 Acute kidney failure, unspecified: Secondary | ICD-10-CM | POA: Diagnosis present

## 2017-03-10 DIAGNOSIS — G9341 Metabolic encephalopathy: Secondary | ICD-10-CM

## 2017-03-10 DIAGNOSIS — E1111 Type 2 diabetes mellitus with ketoacidosis with coma: Secondary | ICD-10-CM | POA: Diagnosis not present

## 2017-03-10 DIAGNOSIS — R9401 Abnormal electroencephalogram [EEG]: Secondary | ICD-10-CM | POA: Diagnosis present

## 2017-03-10 DIAGNOSIS — E8881 Metabolic syndrome: Secondary | ICD-10-CM | POA: Diagnosis present

## 2017-03-10 DIAGNOSIS — J9601 Acute respiratory failure with hypoxia: Secondary | ICD-10-CM | POA: Diagnosis present

## 2017-03-10 DIAGNOSIS — Z794 Long term (current) use of insulin: Secondary | ICD-10-CM

## 2017-03-10 DIAGNOSIS — M7989 Other specified soft tissue disorders: Secondary | ICD-10-CM

## 2017-03-10 DIAGNOSIS — R402233 Coma scale, best verbal response, inappropriate words, at hospital admission: Secondary | ICD-10-CM | POA: Diagnosis present

## 2017-03-10 DIAGNOSIS — G0490 Encephalitis and encephalomyelitis, unspecified: Secondary | ICD-10-CM | POA: Diagnosis not present

## 2017-03-10 DIAGNOSIS — Z9911 Dependence on respirator [ventilator] status: Secondary | ICD-10-CM

## 2017-03-10 DIAGNOSIS — R945 Abnormal results of liver function studies: Secondary | ICD-10-CM | POA: Diagnosis present

## 2017-03-10 DIAGNOSIS — L97519 Non-pressure chronic ulcer of other part of right foot with unspecified severity: Secondary | ICD-10-CM | POA: Diagnosis present

## 2017-03-10 DIAGNOSIS — Z6841 Body Mass Index (BMI) 40.0 and over, adult: Secondary | ICD-10-CM | POA: Diagnosis not present

## 2017-03-10 DIAGNOSIS — E1042 Type 1 diabetes mellitus with diabetic polyneuropathy: Secondary | ICD-10-CM | POA: Diagnosis present

## 2017-03-10 DIAGNOSIS — M1990 Unspecified osteoarthritis, unspecified site: Secondary | ICD-10-CM | POA: Diagnosis present

## 2017-03-10 DIAGNOSIS — Z9119 Patient's noncompliance with other medical treatment and regimen: Secondary | ICD-10-CM | POA: Diagnosis not present

## 2017-03-10 DIAGNOSIS — IMO0002 Reserved for concepts with insufficient information to code with codable children: Secondary | ICD-10-CM | POA: Diagnosis present

## 2017-03-10 DIAGNOSIS — Z833 Family history of diabetes mellitus: Secondary | ICD-10-CM

## 2017-03-10 DIAGNOSIS — G931 Anoxic brain damage, not elsewhere classified: Secondary | ICD-10-CM | POA: Diagnosis not present

## 2017-03-10 DIAGNOSIS — R230 Cyanosis: Secondary | ICD-10-CM | POA: Diagnosis not present

## 2017-03-10 DIAGNOSIS — E10621 Type 1 diabetes mellitus with foot ulcer: Secondary | ICD-10-CM | POA: Diagnosis present

## 2017-03-10 DIAGNOSIS — K859 Acute pancreatitis without necrosis or infection, unspecified: Secondary | ICD-10-CM | POA: Diagnosis present

## 2017-03-10 DIAGNOSIS — R402234 Coma scale, best verbal response, inappropriate words, 24 hours or more after hospital admission: Secondary | ICD-10-CM | POA: Diagnosis not present

## 2017-03-10 DIAGNOSIS — E861 Hypovolemia: Secondary | ICD-10-CM | POA: Diagnosis present

## 2017-03-10 DIAGNOSIS — Z8249 Family history of ischemic heart disease and other diseases of the circulatory system: Secondary | ICD-10-CM

## 2017-03-10 DIAGNOSIS — R402353 Coma scale, best motor response, localizes pain, at hospital admission: Secondary | ICD-10-CM | POA: Diagnosis present

## 2017-03-10 DIAGNOSIS — K7201 Acute and subacute hepatic failure with coma: Secondary | ICD-10-CM

## 2017-03-10 DIAGNOSIS — Z452 Encounter for adjustment and management of vascular access device: Secondary | ICD-10-CM

## 2017-03-10 DIAGNOSIS — Z515 Encounter for palliative care: Secondary | ICD-10-CM | POA: Diagnosis present

## 2017-03-10 DIAGNOSIS — A419 Sepsis, unspecified organism: Secondary | ICD-10-CM

## 2017-03-10 DIAGNOSIS — G35 Multiple sclerosis: Secondary | ICD-10-CM | POA: Diagnosis present

## 2017-03-10 DIAGNOSIS — R569 Unspecified convulsions: Secondary | ICD-10-CM | POA: Diagnosis not present

## 2017-03-10 DIAGNOSIS — J9690 Respiratory failure, unspecified, unspecified whether with hypoxia or hypercapnia: Secondary | ICD-10-CM

## 2017-03-10 DIAGNOSIS — I82622 Acute embolism and thrombosis of deep veins of left upper extremity: Secondary | ICD-10-CM | POA: Diagnosis present

## 2017-03-10 DIAGNOSIS — J9602 Acute respiratory failure with hypercapnia: Secondary | ICD-10-CM | POA: Diagnosis present

## 2017-03-10 DIAGNOSIS — Z882 Allergy status to sulfonamides status: Secondary | ICD-10-CM

## 2017-03-10 DIAGNOSIS — R609 Edema, unspecified: Secondary | ICD-10-CM

## 2017-03-10 DIAGNOSIS — I998 Other disorder of circulatory system: Secondary | ICD-10-CM | POA: Diagnosis present

## 2017-03-10 DIAGNOSIS — L8990 Pressure ulcer of unspecified site, unspecified stage: Secondary | ICD-10-CM | POA: Diagnosis present

## 2017-03-10 DIAGNOSIS — M86171 Other acute osteomyelitis, right ankle and foot: Secondary | ICD-10-CM

## 2017-03-10 DIAGNOSIS — Z886 Allergy status to analgesic agent status: Secondary | ICD-10-CM

## 2017-03-10 DIAGNOSIS — M6282 Rhabdomyolysis: Secondary | ICD-10-CM | POA: Diagnosis present

## 2017-03-10 DIAGNOSIS — I10 Essential (primary) hypertension: Secondary | ICD-10-CM | POA: Diagnosis present

## 2017-03-10 DIAGNOSIS — E8770 Fluid overload, unspecified: Secondary | ICD-10-CM | POA: Diagnosis not present

## 2017-03-10 DIAGNOSIS — S301XXA Contusion of abdominal wall, initial encounter: Secondary | ICD-10-CM | POA: Diagnosis not present

## 2017-03-10 DIAGNOSIS — Z79899 Other long term (current) drug therapy: Secondary | ICD-10-CM

## 2017-03-10 DIAGNOSIS — Y838 Other surgical procedures as the cause of abnormal reaction of the patient, or of later complication, without mention of misadventure at the time of the procedure: Secondary | ICD-10-CM | POA: Diagnosis not present

## 2017-03-10 DIAGNOSIS — E1011 Type 1 diabetes mellitus with ketoacidosis with coma: Secondary | ICD-10-CM | POA: Diagnosis present

## 2017-03-10 DIAGNOSIS — Z79891 Long term (current) use of opiate analgesic: Secondary | ICD-10-CM

## 2017-03-10 DIAGNOSIS — R402144 Coma scale, eyes open, spontaneous, 24 hours or more after hospital admission: Secondary | ICD-10-CM | POA: Diagnosis not present

## 2017-03-10 LAB — CBC WITH DIFFERENTIAL/PLATELET
BLASTS: 0 %
Band Neutrophils: 4 %
Basophils Absolute: 0 10*3/uL (ref 0–0.1)
Basophils Relative: 0 %
Eosinophils Absolute: 0 10*3/uL (ref 0–0.7)
Eosinophils Relative: 0 %
HCT: 54 % — ABNORMAL HIGH (ref 40.0–52.0)
HEMOGLOBIN: 15.5 g/dL (ref 13.0–18.0)
LYMPHS PCT: 14 %
Lymphs Abs: 1.9 10*3/uL (ref 1.0–3.6)
MCH: 27 pg (ref 26.0–34.0)
MCHC: 28.7 g/dL — ABNORMAL LOW (ref 32.0–36.0)
MCV: 94.1 fL (ref 80.0–100.0)
MYELOCYTES: 0 %
Metamyelocytes Relative: 2 %
Monocytes Absolute: 0 10*3/uL — ABNORMAL LOW (ref 0.2–1.0)
Monocytes Relative: 0 %
NEUTROS PCT: 80 %
NRBC: 0 /100{WBCs}
Neutro Abs: 11.5 10*3/uL — ABNORMAL HIGH (ref 1.4–6.5)
OTHER: 0 %
PLATELETS: 379 10*3/uL (ref 150–440)
PROMYELOCYTES ABS: 0 %
RBC: 5.74 MIL/uL (ref 4.40–5.90)
RDW: 15.7 % — ABNORMAL HIGH (ref 11.5–14.5)
SMEAR REVIEW: ADEQUATE
WBC: 13.4 10*3/uL — AB (ref 3.8–10.6)

## 2017-03-10 LAB — BLOOD GAS, VENOUS
Acid-base deficit: 14.7 mmol/L — ABNORMAL HIGH (ref 0.0–2.0)
Bicarbonate: 17.6 mmol/L — ABNORMAL LOW (ref 20.0–28.0)
O2 Saturation: 64.3 %
PATIENT TEMPERATURE: 37
PH VEN: 7.02 — AB (ref 7.250–7.430)
pCO2, Ven: 68 mmHg — ABNORMAL HIGH (ref 44.0–60.0)
pO2, Ven: 51 mmHg — ABNORMAL HIGH (ref 32.0–45.0)

## 2017-03-10 LAB — BLOOD GAS, ARTERIAL
ACID-BASE DEFICIT: 11.5 mmol/L — AB (ref 0.0–2.0)
Bicarbonate: 18 mmol/L — ABNORMAL LOW (ref 20.0–28.0)
FIO2: 0.6
Mechanical Rate: 18
O2 SAT: 92.9 %
PATIENT TEMPERATURE: 37
PCO2 ART: 54 mmHg — AB (ref 32.0–48.0)
PEEP/CPAP: 5 cmH2O
PH ART: 7.13 — AB (ref 7.350–7.450)
PO2 ART: 87 mmHg (ref 83.0–108.0)
VT: 500 mL

## 2017-03-10 LAB — ACETAMINOPHEN LEVEL: Acetaminophen (Tylenol), Serum: <10 ug/mL — ABNORMAL LOW (ref 10–30)

## 2017-03-10 LAB — COMPREHENSIVE METABOLIC PANEL
ALK PHOS: 279 U/L — AB (ref 38–126)
ALT: 2250 U/L — AB (ref 17–63)
AST: 4855 U/L — ABNORMAL HIGH (ref 15–41)
Albumin: 3.6 g/dL (ref 3.5–5.0)
Anion gap: 20 — ABNORMAL HIGH (ref 5–15)
BUN: 35 mg/dL — AB (ref 6–20)
CALCIUM: 8.5 mg/dL — AB (ref 8.9–10.3)
CHLORIDE: 84 mmol/L — AB (ref 101–111)
CO2: 18 mmol/L — AB (ref 22–32)
CREATININE: 3.09 mg/dL — AB (ref 0.61–1.24)
GFR, EST AFRICAN AMERICAN: 28 mL/min — AB (ref >60)
GFR, EST NON AFRICAN AMERICAN: 24 mL/min — AB (ref >60)
Glucose, Bld: 1184 mg/dL (ref 65–99)
Potassium: >7.5 mmol/L (ref 3.5–5.1)
SODIUM: 122 mmol/L — AB (ref 135–145)
Total Bilirubin: 1.2 mg/dL (ref 0.3–1.2)
Total Protein: 8.3 g/dL — ABNORMAL HIGH (ref 6.5–8.1)

## 2017-03-10 LAB — URINALYSIS, ROUTINE W REFLEX MICROSCOPIC
BACTERIA UA: NONE SEEN
Bilirubin Urine: NEGATIVE
Glucose, UA: >=500 mg/dL — AB
Ketones, ur: NEGATIVE mg/dL
Leukocytes, UA: NEGATIVE
NITRITE: NEGATIVE
PROTEIN: 30 mg/dL — AB
SPECIFIC GRAVITY, URINE: 1.024 (ref 1.005–1.030)
Squamous Epithelial / LPF: NONE SEEN
pH: 6 (ref 5.0–8.0)

## 2017-03-10 LAB — URINE DRUG SCREEN, QUALITATIVE (ARMC ONLY)
Amphetamines, Ur Screen: NOT DETECTED
Barbiturates, Ur Screen: NOT DETECTED
Benzodiazepine, Ur Scrn: NOT DETECTED
CANNABINOID 50 NG, UR ~~LOC~~: NOT DETECTED
COCAINE METABOLITE, UR ~~LOC~~: NOT DETECTED
MDMA (ECSTASY) UR SCREEN: NOT DETECTED
Methadone Scn, Ur: NOT DETECTED
Opiate, Ur Screen: POSITIVE — AB
PHENCYCLIDINE (PCP) UR S: NOT DETECTED
Tricyclic, Ur Screen: NOT DETECTED

## 2017-03-10 LAB — GLUCOSE, CAPILLARY

## 2017-03-10 MED ORDER — ETOMIDATE 2 MG/ML IV SOLN
15.0000 mg | Freq: Once | INTRAVENOUS | Status: AC
Start: 2017-03-10 — End: 2017-03-10
  Administered 2017-03-10: 15 mg via INTRAVENOUS

## 2017-03-10 MED ORDER — PROPOFOL 1000 MG/100ML IV EMUL
5.0000 ug/kg/min | Freq: Once | INTRAVENOUS | Status: DC
  Administered 2017-03-10: 10 ug/kg/min via INTRAVENOUS

## 2017-03-10 MED ORDER — SODIUM CHLORIDE 0.9 % IV BOLUS (SEPSIS): 1000.0000 mL | Freq: Once | INTRAVENOUS | Status: DC

## 2017-03-10 MED ORDER — SODIUM CHLORIDE 0.9 % IV SOLN
INTRAVENOUS | Status: DC
  Administered 2017-03-10: 22:00:00 via INTRAVENOUS
  Filled 2017-03-10: qty 1

## 2017-03-10 MED ORDER — DEXMEDETOMIDINE HCL IN NACL 400 MCG/100ML IV SOLN
INTRAVENOUS | Status: AC
  Filled 2017-03-10: qty 100

## 2017-03-10 MED ORDER — NOREPINEPHRINE BITARTRATE 1 MG/ML IV SOLN
0.0000 ug/min | Freq: Once | INTRAVENOUS | Status: AC
  Administered 2017-03-10: 20 ug/min via INTRAVENOUS
  Filled 2017-03-10 (×2): qty 4

## 2017-03-10 MED ORDER — PIPERACILLIN-TAZOBACTAM 3.375 G IVPB 30 MIN
3.3750 g | Freq: Once | INTRAVENOUS | Status: AC
  Administered 2017-03-10: 3.375 g via INTRAVENOUS

## 2017-03-10 MED ORDER — SODIUM CHLORIDE 0.9 % IV BOLUS (SEPSIS): 500.0000 mL | Freq: Once | INTRAVENOUS | Status: DC

## 2017-03-10 MED ORDER — SODIUM CHLORIDE 0.9 % IV BOLUS (SEPSIS)
1000.0000 mL | Freq: Once | INTRAVENOUS | Status: AC
  Administered 2017-03-10: 1000 mL via INTRAVENOUS

## 2017-03-10 MED ORDER — FENTANYL 2500MCG IN NS 250ML (10MCG/ML) PREMIX INFUSION
0.0000 ug/h | INTRAVENOUS | Status: DC
  Administered 2017-03-10: 25 ug/h via INTRAVENOUS
  Administered 2017-03-11: 30 ug/h via INTRAVENOUS
  Administered 2017-03-12 (×3): 400 ug/h via INTRAVENOUS
  Administered 2017-03-13 (×2): 200 ug/h via INTRAVENOUS
  Administered 2017-03-14: 300 ug/h via INTRAVENOUS
  Administered 2017-03-14: 75 ug/h via INTRAVENOUS
  Administered 2017-03-15: 100 ug/h via INTRAVENOUS
  Filled 2017-03-10 (×10): qty 250

## 2017-03-10 MED ORDER — PIPERACILLIN-TAZOBACTAM 3.375 G IVPB
INTRAVENOUS | Status: AC
  Filled 2017-03-10: qty 50

## 2017-03-10 MED ORDER — VANCOMYCIN HCL IN DEXTROSE 1-5 GM/200ML-% IV SOLN: 1000.0000 mg | Freq: Once | INTRAVENOUS | Status: DC

## 2017-03-10 MED ORDER — INSULIN ASPART 100 UNIT/ML ~~LOC~~ SOLN
8.0000 [IU] | Freq: Once | SUBCUTANEOUS | Status: AC
  Administered 2017-03-10: 8 [IU] via INTRAVENOUS
  Filled 2017-03-10: qty 1

## 2017-03-10 MED ORDER — PROPOFOL 1000 MG/100ML IV EMUL
INTRAVENOUS | Status: AC
  Filled 2017-03-10: qty 100

## 2017-03-10 MED ORDER — ROCURONIUM BROMIDE 50 MG/5ML IV SOLN
100.0000 mg | Freq: Once | INTRAVENOUS | Status: AC
  Administered 2017-03-10: 100 mg via INTRAVENOUS
  Filled 2017-03-10: qty 10

## 2017-03-10 MED ORDER — SODIUM CHLORIDE 0.9 % IV SOLN: 0.0000 ug/h | INTRAVENOUS | Status: DC

## 2017-03-10 MED ORDER — DEXTROSE-NACL 5-0.45 % IV SOLN: INTRAVENOUS | Status: DC

## 2017-03-10 MED ORDER — DEXMEDETOMIDINE HCL IN NACL 400 MCG/100ML IV SOLN
0.4000 ug/kg/h | INTRAVENOUS | Status: DC
  Administered 2017-03-11: 0.8 ug/kg/h via INTRAVENOUS
  Administered 2017-03-11: 0.4 ug/kg/h via INTRAVENOUS
  Administered 2017-03-11: 0.8 ug/kg/h via INTRAVENOUS
  Administered 2017-03-12 (×3): 0.4 ug/kg/h via INTRAVENOUS
  Filled 2017-03-10 (×5): qty 100

## 2017-03-10 NOTE — ED Provider Notes (Addendum)
[facility] Emergency Department Provider Note  Time seen: 8:36 PM  I have reviewed the triage vital signs and the nursing notes.   HISTORY  Chief Complaint Loss of Consciousness    HPI Zachary Graves is a 37 y.o. male with a past medical history of back pain, diabetes who presents to the emergency department via EMS emergency traffic for unresponsiveness.  Per EMS report the patient was last seen this morning around 8:00, EMS was called this evening for unresponsiveness, found the patient at home largely nonresponsive.  Tried 1 mg of Narcan without response.  They did place an intraosseous catheter, states minimal groaning with intraosseous catheter placement otherwise nonresponsive to any stimuli throughout their transport.  State initial O2 saturation of 80% on room air.  Place the patient on nonrebreather and transported via emergency traffic to the ER for evaluation.  Upon arrival the patient remains nonresponsive to painful stimuli.  Did not respond to IV attempts or sternal rub.  Intermittently would open his eyes very briefly and then closes them, would not follow commands.  Did not appear to be protecting his airway.  Patient unable to provide any history, answer any questions or follow any commands.   Past Medical History:  Diagnosis Date  . Back pain   . Diabetes 1.5, managed as type 1 (HCC)   . Legg-Calve-Perthes disease   . Neuropathic pain   . Obesity   . Osteoarthritis     There are no active problems to display for this patient.   Past Surgical History:  Procedure Laterality Date  . LEG SURGERY      Prior to Admission medications   Medication Sig Start Date End Date Taking? Authorizing Provider  ATENOLOL PO Take by mouth.    [provider]  ibuprofen (ADVIL,MOTRIN) 800 MG tablet Take 800 mg by mouth every 8 (eight) hours as needed. For pain    [provider]  ondansetron (ZOFRAN-ODT) 4 MG disintegrating tablet Take 1  tablet (4 mg total) every 8 (eight) hours as needed by mouth for nausea or vomiting. 01/08/17   Benjiman Core, MD  oxycodone (ROXICODONE) 30 MG immediate release tablet Take 30 mg by mouth 5 (five) times daily. For pain    [provider]  OxyCODONE HCl (OXYCONTIN PO) Take by mouth.    [provider]  Phenylephrine-DM-GG-APAP (TYLENOL COLD/FLU SEVERE) 5-10-200-325 MG TABS Take 2 tablets by mouth every 4 (four) hours as needed. For fever and nausea    [provider]  tiZANidine (ZANAFLEX) 4 MG tablet Take 4 mg by mouth 4 (four) times daily.    [provider]    Allergies  Allergen Reactions  . Naproxen   . Sulfa Antibiotics Hives    No family history on file.  Social History Social History   Tobacco Use  . Smoking status: Never Smoker  . Smokeless tobacco: Never Used  Substance Use Topics  . Alcohol use: No  . Drug use: No    Review of Systems Unable to obtain review of systems due to unresponsiveness. ____________________________________________   PHYSICAL EXAM:  Constitutional: Unresponsive to verbal and painful stimuli in the emergency department.  Will occasionally open his eyes briefly and then shuts them, does not follow commands, will not answer questions. Eyes: 2 mm, reactive. ENT   Head: Normocephalic and atraumatic   Mouth/Throat: Extremely dry mucous membranes. Cardiovascular: Regular rhythm, rate around 120 bpm.  No murmur. Respiratory: Equal breath sounds without obvious wheeze rales or  rhonchi Gastrointestinal: Soft, nondistended.  No reaction to palpation but largely unresponsive. Musculoskeletal: Intraosseous catheter inserted in the tibia, extremities otherwise atraumatic Neurologic: Largely unresponsive, does not respond to painful or verbal stimuli Skin: Skin is cool to the touch but dry. Psychiatric: Unable to assess psychiatrically due to unresponsiveness  ____________________________________________     EKG  EKG reviewed and interpreted by myself shows normal sinus rhythm 89 bpm, narrow QRS, normal axis, largely normal intervals with nonspecific but no concerning ST changes.  No peak T waves noted.  ____________________________________________    RADIOLOGY  IMPRESSION: LEFT basilar atelectasis with atelectasis versus consolidation in retrocardiac LEFT lower lobe.  Satisfactory endotracheal tube position.  ____________________________________________   INITIAL IMPRESSION / ASSESSMENT AND PLAN / ED COURSE  Pertinent labs & imaging results that were available during my care of the patient were reviewed by me and considered in my medical decision making (see chart for details).  Patient presents to the emergency department via emergency traffic for unresponsiveness.  Patient is a known diabetic.  Differential would include DKA, HHS with coma, substance use/abuse, CVA, metabolic abnormality.  Patient presented the emergency traffic unresponsive to painful stimuli or IV attempts.  Unable to answer questions or follow commands.  Unable to adequately protect his airway given his unresponsiveness.  Decision was made to intubate for airway protection.  We were able to place a 20-gauge IV to the left AC, 22-gauge to the right hand.  After intubation I placed an 18-gauge to the right AC via ultrasound guidance.  We will begin IV hydration.  Send labs including VBG.  VBG is resulted showing a pH of 7.02 highly suspect DKA will continue with IV hydration until the chemistry has resulted.  We will use propofol for sedation.   Patient's labs have resulted significantly abnormal with a glucose greater than 1100, pH 7.02, appears to be in acute renal failure as well as acute liver failure.  It is not entirely clear why the patient has liver failure, patient was quite hypotensive upon arrival in the 60s per EMS.  Could be shock liver from prolonged hypotension.  I added on an acetaminophen level which  is negative.  I discussed the patient with the hospitalist who admitted to their service.  Patient has become hypotensive once again, blood pressure again in the 60s systolic with a faint carotid pulse.  Patient started on norepinephrine.  I placed a right IJ central line.  Patient admitted to the hospitalist service for ICU admission.  INTUBATION Performed by: Minna Antis  Required items: required blood products, implants, devices, and special equipment available Patient identity confirmed: provided demographic data and hospital-assigned identification number Time out: Immediately prior to procedure a "time out" was called to verify the correct patient, procedure, equipment, support staff and site/side marked as required.  Indications: Airway protection/unresponsiveness  Intubation method: 3.0 Glidescope Laryngoscopy   Preoxygenation: 100% BVM  Sedatives: 15 mg etomidate Paralytic: 100 mg of rocuronium  Tube Size: 7.5 cuffed  Post-procedure assessment: chest rise and ETCO2 monitor Breath sounds: equal and absent over the epigastrium Tube secured with: ETT holder Chest x-ray interpreted by radiologist and me.  Chest x-ray findings: endotracheal tube in appropriate position  Patient tolerated the procedure well with no immediate complications.  CENTRAL LINE Performed by: Minna Antis Consent: The procedure was performed in an emergent situation. Required items: required blood products, implants, devices, and special equipment available Patient identity confirmed: arm band and provided demographic data Time out: Immediately prior to procedure a "  time out" was called to verify the correct patient, procedure, equipment, support staff and site/side marked as required. Indications: vascular access Anesthesia: local infiltration Local anesthetic: lidocaine 1% with epinephrine Anesthetic total: 3 ml Patient sedated: no Preparation: skin prepped with 2% chlorhexidine Skin  prep agent dried: skin prep agent completely dried prior to procedure Sterile barriers: all five maximum sterile barriers used - cap, mask, sterile gown, sterile gloves, and large sterile sheet Hand hygiene: hand hygiene performed prior to central venous catheter insertion  Location details: Right IJ  Catheter type: triple lumen Catheter size: 8 Fr Pre-procedure: landmarks identified Ultrasound guidance: Yes Successful placement: yes Post-procedure: line sutured and dressing applied Assessment: blood return through all parts, free fluid flow, placement verified by x-ray and no pneumothorax on x-ray Patient tolerance: Patient tolerated the procedure well with no immediate complications.  CRITICAL CARE Performed by: Minna Antis   Total critical care time: 90 minutes  Critical care time was exclusive of separately billable procedures and treating other patients.  Critical care was necessary to treat or prevent imminent or life-threatening deterioration.  Critical care was time spent personally by me on the following activities: development of treatment plan with patient and/or surrogate as well as nursing, discussions with consultants, evaluation of patient's response to treatment, examination of patient, obtaining history from patient or surrogate, ordering and performing treatments and interventions, ordering and review of laboratory studies, ordering and review of radiographic studies, pulse oximetry and re-evaluation of patient's condition.  Patient's vitals have resulted showing a temperature 100.4.  Given the patient's hypotension fever and elevated white blood cell count of initiated sepsis protocols.  Patient receiving IV antibiotics. ____________________________________________   FINAL CLINICAL IMPRESSION(S) / ED DIAGNOSES  DKA Unresponsiveness hypotension Acute renal failure Acute liver failure Sepsis  Minna Antis, MD 03-13-2017 2308    Minna Antis, MD 03-13-2017 2311

## 2017-03-10 NOTE — ED Notes (Signed)
Per EMS pt was last known well at 0800 by family. Pt was found down in the bathroom by family when they arrived home. Pt was found on left side which indicates mottled skin. Initial BS was reading over 700 with Fire and then EMS got 435. Pt was given 1 of Narcan with no response. EMS states only response from pt was when they flushed the IO.

## 2017-03-10 NOTE — Progress Notes (Signed)
Vent changes made per MD order. VT 650, RR 18, FIO2 .60, Peep 5.

## 2017-03-10 NOTE — ED Notes (Addendum)
Triple lumen central line placed by Dr Lenard Lance

## 2017-03-10 NOTE — ED Notes (Signed)
Family at bedside. 

## 2017-03-10 NOTE — ED Notes (Signed)
Sister of pt at bedside and updated on pt's care and advised that MD would be in to discuss pt's status of care

## 2017-03-10 NOTE — ED Triage Notes (Signed)
unresponsive

## 2017-03-10 NOTE — ED Notes (Signed)
MD Paduchowski notified of critical lab results to include Potassium > 7.5 and glucose 1,184.

## 2017-03-10 NOTE — ED Notes (Signed)
Zachary Graves mother of pt, please call with any new updates [phone]

## 2017-03-10 NOTE — Consult Note (Addendum)
PULMONARY / CRITICAL CARE MEDICINE   Name: Zachary Graves MRN: [id number] DOB: 23-Apr-1980    ADMISSION DATE:  03/02/2017 CONSULTATION DATE: 03/10/2017  REFERRING MD: Dr. Jannifer Franklin   CHIEF COMPLAINT: Unresponsive   HISTORY OF PRESENT ILLNESS:   This is a 37 yo male with a PMH of Osteoarthritis, Chronic Right Foot Ulceration (currently managed by wound clinic at [facility]), Obesity, Neuropathic Pain, Legg-Calve-Perthes Diseases, Type I DM, and Chronic Back Pain.  He presented to [facility] ER via EMS on 01/9 after being found unresponsive on the floor at home by his family.  Per ER notes the pts last known well time was 08:00 am this morning.  Upon EMS arrival the pt remained unresponsive, therefore he received 1 mg of Narcan without improvement in mentation.  EMS did place a intraosseous catheter in the field and at that time the pt did respond with minimal groaning. However, during transport to the ER he remained unresponsive with O2 sats 80% on RA requiring placement of NRB.  In the ER he remained unresponsive requiring mechanical intubation for airway protection.  Lab results revealed the pt was in severe DKA, pt received 5L NS bolus and insulin gtt initiated.  He was subsequently admitted to ICU by hospitalist team for further workup and treatment PCCM consulted for vent management.    PAST MEDICAL HISTORY :  He  has a past medical history of Back pain, Diabetes 1.5, managed as type 1 ([place]), Legg-Calve-Perthes disease, Neuropathic pain, Obesity, and Osteoarthritis.  PAST SURGICAL HISTORY: He  has a past surgical history that includes Leg Surgery.  Allergies  Allergen Reactions  . Naproxen   . Sulfa Antibiotics Hives    No current facility-administered medications on file prior to encounter.    Current Outpatient Medications on File Prior to Encounter  Medication Sig  . ATENOLOL PO Take by mouth.  . DULoxetine (CYMBALTA) 30 MG capsule Take 30 mg by mouth daily.  Marland Kitchen gabapentin (NEURONTIN) 800 MG  tablet Take 800 mg by mouth 4 (four) times daily.  Marland Kitchen ibuprofen (ADVIL,MOTRIN) 800 MG tablet Take 800 mg by mouth every 8 (eight) hours as needed. For pain  . ondansetron (ZOFRAN-ODT) 4 MG disintegrating tablet Take 1 tablet (4 mg total) every 8 (eight) hours as needed by mouth for nausea or vomiting.  . Oxycodone HCl 20 MG TABS Take 1 tablet by mouth every 6 (six) hours as needed.  . OXYCONTIN 60 MG 12 hr tablet Take 60 mg by mouth every 12 (twelve) hours.  . Phenylephrine-DM-GG-APAP (TYLENOL COLD/FLU SEVERE) 5-10-200-325 MG TABS Take 2 tablets by mouth every 4 (four) hours as needed. For fever and nausea  . tiZANidine (ZANAFLEX) 4 MG tablet Take 4 mg by mouth 4 (four) times daily.    FAMILY HISTORY:  His indicated that the status of his mother is unknown. He indicated that the status of his father is unknown.   SOCIAL HISTORY: He  reports that  has never smoked. he has never used smokeless tobacco. He reports that he does not drink alcohol or use drugs.  REVIEW OF SYSTEMS:   Unable to assess pt intubated   SUBJECTIVE:  Unable to assess pt intubated  VITAL SIGNS: BP (!) 100/59   Pulse 80   Temp (!) 100.4 F (38 C)   Resp 19   Ht _0  (1.88 m)   Wt (!) 145.2 kg (320 lb)   SpO2 99%   BMI 41.09 kg/m   HEMODYNAMICS:    VENTILATOR SETTINGS: Vent Mode:  AC FiO2 (%):  [60 %] 60 % Set Rate:  [18 bmp] 18 bmp Vt Set:  [500 mL-650 mL] 650 mL PEEP:  [5 cmH20] 5 cmH20  INTAKE / OUTPUT: No intake/output data recorded.  PHYSICAL EXAMINATION: General: acutely ill appearing Caucasian male, NAD mechanically intubated  Neuro: sedated, not following commands, PERRL HEENT: supple, no JVD  Cardiovascular: nsr, s1s2, no M/R/G Lungs: rhonchi and expiratory wheezes throughout, even, non labored  Abdomen: hypoactive BS x4, obese, soft, non tender, non distended  Musculoskeletal: normal tone, no edema  Skin: right medial aspect of ankle diabetic ulceration with serosanguinous drainage  present, right great toe necrosis scattered abrasions and ecchymotic area right forearm and right hand, abrasion right ear and large ecchymotic area right lateral head  LABS:  BMET Recent Labs  Lab 03/09/2017 2012  NA 122*  K >7.5*  CL 84*  CO2 18*  BUN 35*  CREATININE 3.09*  GLUCOSE 1,184*    Electrolytes Recent Labs  Lab 03/16/2017 2012  CALCIUM 8.5*    CBC Recent Labs  Lab 03/30/2017 2012  WBC 13.4*  HGB 15.5  HCT 54.0*  PLT 379    Coag's No results for input(s): APTT, INR in the last 168 hours.  Sepsis Markers No results for input(s): LATICACIDVEN, PROCALCITON, O2SATVEN in the last 168 hours.  ABG Recent Labs  Lab 03/18/2017 2130  PHART 7.13*  PCO2ART 54*  PO2ART 87    Liver Enzymes Recent Labs  Lab 03/19/2017 2012  AST 4,855*  ALT 2,250*  ALKPHOS 279*  BILITOT 1.2  ALBUMIN 3.6    Cardiac Enzymes No results for input(s): TROPONINI, PROBNP in the last 168 hours.  Glucose Recent Labs  Lab 03/24/2017 2016 03/24/2017 2159  GLUCAP >600* >600*    Imaging Dg Chest Portable 1 View  Result Date: 03/29/2017 CLINICAL DATA:  Intubation EXAM: PORTABLE CHEST 1 VIEW COMPARISON:  Portable exam 2013 hours compared to 01/08/2017 FINDINGS: Tip of endotracheal tube projects 4.0 cm above carina. Upper normal heart size. Azygos fissure. Mediastinal contours and pulmonary vascularity normal. Low lung volumes with mild LEFT basilar atelectasis. Additional opacity in retrocardiac LEFT lower lobe question infiltrate versus atelectasis. Upper lungs clear. No pleural effusion or pneumothorax. IMPRESSION: LEFT basilar atelectasis with atelectasis versus consolidation in retrocardiac LEFT lower lobe. Satisfactory endotracheal tube position. Electronically Signed   By: Lavonia Dana M.D.   On: 03/26/2017 20:40   STUDIES:  CT Head 01/9>>Normal noncontrast CT of the brain. Moderate left temporal subcutaneous hematoma. Paranasal sinus disease.  CULTURES: Blood 01/9>> Urine  01/9>> Respiratory 01/9>> Right ankle wound culutre>>  ANTIBIOTICS: Zosyn x1 dose 01/9 Vancomycin 01/9>> Cefepime 01/9>>  SIGNIFICANT EVENTS: 01/9-Pt admitted to ICU mechanically intubated  LINES/TUBES: ETT 01/9>> Right IJ CVL 01/9>>  ASSESSMENT / PLAN:  PULMONARY A: Acute hypoxic hypercapnic respiratory failure secondary to possible pneumonia Mechanical Intubation for airway protection  P:   Full vent support vent settings established  SBT once all parameters met  Scheduled and prn bronchodilator therapy Stress dose steroids added  Repeat CXR in am  VAP Bundle implemented   CARDIOVASCULAR A:  Hypotension secondary to sepsis and hypovolemia  P:  Continuous telemetry monitoring Prn levophed and vasopressin gtts to maintain map >65 Trend troponin's   RENAL A:   Acute renal failure  Severe Metabolic Acidosis Pseudohyponatremia in the setting of DKA Hyperkalemia  P:   BMP's q4hrs while on insulin gtt  Replace electrolytes as indicated  Monitor UOP  Sodium Bicarb gtt_0  ml/hr   GASTROINTESTINAL  A:   Transaminitis secondary to sepsis  P:   Trend liver enzymes Pepcid for PUD prophylaxis  Keep NPO for now   HEMATOLOGIC A:   Left Temporal Subcutaneous Hematoma  P:  SCD's for VTE prophylaxis, avoid chemical prophylaxis  Trend CBC  Monitor for s/sx of bleeding  Transfuse for hgb <7  INFECTIOUS A:   Questionable Pneumonia  Diabetic Right Foot Ulceration-Chronic  P:   Trend WBC and monitor fever curve Trend lactic acid and PCT Follow cultures Continue current abx as listed above  Stat right ankle xray to r/o osteomyelitis   ENDOCRINE A:   DKA  P:   Continue DKA protocol until anion gap closed and serum CO2 >20 then will transition to ICU IV glycemic control protocol  CBG's q1hr while on insulin gtt Follow hypoglycemic protocol  NEUROLOGIC A:   Acute encephalopathy  Mechanical Intubation Discomfort  P:   RASS goal: 0 to -1 Fentanyl gtt  and prn versed to maintain RASS goal  WUA daily  Will repeat CT Head in 24 hrs   FAMILY  - Updates: No family currently at bedside to update at this time 03/11/17  -Case and plan of care discussed with EICU intensivist Dr. Mortimer Fries who is in agreement with current plan of care.  Marda Stalker, [place] Pager [phone] (please enter 7 digits) [place] Pager [phone] (please enter 7 digits)   Pulmonary/critical care  I have personally seen and examined Mr. Kissick, reviewed revise and confirm nurse practitioner's note. I independently performed history, physical, reviewed laboratory studies along with all imaging studies. In short,  37 yo male with a PMH of Osteoarthritis, Chronic Right Foot Ulceration (currently managed by wound clinic at [facility]), Obesity, Neuropathic Pain, Legg-Calve-Perthes Diseases, Type I DM, and Chronic Back Pain.  He presented to [facility] ER via EMS on 01/9 after being found unresponsive on the floor at home by his family. Patient was noted to be in DKA, hypoxemic, hypotensive. Was noted to the intensive care unit, intubated, central line was placed, started on pressors, vasopressin, norepinephrine, insulin infusion. His CK was elevated and he is on a bicarbinate infusion and aggressive rehydration. He is on fentanyl and Precedex for sedation. CVP has read 18. Patient is presently on vasopressin and on norepinephrine. On exam today his left upper extremity appeared mottled and swollen with diminished pulses. Consult vascular surgery for evaluation. Will also obtain left upper extremity venous Doppler study.  Patient is critically ill critical care time 40 minutes.  Hermelinda Dellen, D.O.

## 2017-03-10 NOTE — ED Notes (Signed)
Per sister of pt, mom states pt was fine this am at 0800 and mom got home at 7pm tonight and found pt on bathroom floor

## 2017-03-10 NOTE — H&P (Signed)
[facility] [place] [facility]   PATIENT NAME: Zachary Graves    MR#:  [id number]  DATE OF BIRTH:  08-24-1980  DATE OF ADMISSION:  03/30/17  PRIMARY CARE PHYSICIAN: Patient, No Pcp Per   REQUESTING/REFERRING PHYSICIAN: Paduchowski, MD  CHIEF COMPLAINT:   Chief Complaint  Patient presents with  . Loss of Consciousness    HISTORY OF PRESENT ILLNESS:  Zachary Graves  is a 37 y.o. male who presents with unresponsiveness and found to be in DKA.  Patient was last known well early in the morning when his mother left for work, though he had been vomiting and feeling bad the night before.  When she returned home from work in the afternoon she found him unresponsive on the bathroom floor.  In the ED he required intubation as he was very lethargic and on blood gases found to be hypercarbic.  He was in severe DKA on laboratory evaluation, and also has significantly elevated transaminases and acute kidney injury.  X-ray is suggestive of some pneumonia.  It is unclear what the etiologic chronology as of this patient's problems.  Patient's tox screen is positive for opiates, though he does have an active prescription for oxycodone.  Hospitalist were called for admission  PAST MEDICAL HISTORY:   Past Medical History:  Diagnosis Date  . Back pain   . Diabetes 1.5, managed as type 1 (HCC)   . Legg-Calve-Perthes disease   . Neuropathic pain   . Obesity   . Osteoarthritis     PAST SURGICAL HISTORY:   Past Surgical History:  Procedure Laterality Date  . LEG SURGERY      SOCIAL HISTORY:   Social History   Tobacco Use  . Smoking status: Never Smoker  . Smokeless tobacco: Never Used  Substance Use Topics  . Alcohol use: No    FAMILY HISTORY:   Family History  Problem Relation Age of Onset  . Diabetes Mother   . Hypertension Mother   . Diabetes Father   . Hypertension Father     DRUG ALLERGIES:   Allergies  Allergen Reactions  . Naproxen   .  Sulfa Antibiotics Hives    MEDICATIONS AT HOME:   Prior to Admission medications   Medication Sig Start Date End Date Taking? Authorizing Provider  ATENOLOL PO Take by mouth.    [provider]  DULoxetine (CYMBALTA) 30 MG capsule Take 30 mg by mouth daily. 02/13/17   [provider]  gabapentin (NEURONTIN) 800 MG tablet Take 800 mg by mouth 4 (four) times daily. 02/25/17   [provider]  ibuprofen (ADVIL,MOTRIN) 800 MG tablet Take 800 mg by mouth every 8 (eight) hours as needed. For pain    [provider]  ondansetron (ZOFRAN-ODT) 4 MG disintegrating tablet Take 1 tablet (4 mg total) every 8 (eight) hours as needed by mouth for nausea or vomiting. 01/08/17   Benjiman Core, MD  Oxycodone HCl 20 MG TABS Take 1 tablet by mouth every 6 (six) hours as needed. 02/19/17   [provider]  OXYCONTIN 60 MG 12 hr tablet Take 60 mg by mouth every 12 (twelve) hours. 03/08/17   [provider]  Phenylephrine-DM-GG-APAP (TYLENOL COLD/FLU SEVERE) 5-10-200-325 MG TABS Take 2 tablets by mouth every 4 (four) hours as needed. For fever and nausea    [provider]  tiZANidine (ZANAFLEX) 4 MG tablet Take 4 mg by mouth 4 (four) times daily.    [provider]  REVIEW OF SYSTEMS:  Review of Systems  Unable to perform ROS: Critical illness     VITAL SIGNS:   Vitals:   03/12/2017 2200 03/15/2017 2215 03/06/2017 2217 03/20/2017 2220  BP: (!) 58/28  (!) 69/49 (!) 68/43  Pulse: 83 80 79 78  Resp: 20 18 18 19   Temp: 100.3 F (37.9 C) (!) 100.4 F (38 C) (!) 100.4 F (38 C) (!) 100.5 F (38.1 C)  SpO2: 100% 100% 100% 99%  Weight:      Height:       Wt Readings from Last 3 Encounters:  03/27/2017 (!) 145.2 kg (320 lb)  01/08/17 (!) 145.8 kg (321 lb 6.9 oz)  10/25/14 (!) 154.2 kg (340 lb)    PHYSICAL EXAMINATION:  Physical Exam  Vitals reviewed. Constitutional: He appears well-developed and well-nourished.  HENT:  Head:  Normocephalic and atraumatic.  Mouth/Throat: Oropharynx is clear and moist.  Eyes: Conjunctivae and EOM are normal. Pupils are equal, round, and reactive to light. No scleral icterus.  Neck: Normal range of motion. Neck supple. No JVD present. No thyromegaly present.  Cardiovascular: Normal rate, regular rhythm and intact distal pulses. Exam reveals no gallop and no friction rub.  No murmur heard. Respiratory: Effort normal. No respiratory distress. He has no wheezes. He has no rales.  Bilateral rhonchi  GI: Soft. Bowel sounds are normal. He exhibits no distension. There is no tenderness.  Musculoskeletal: Normal range of motion. He exhibits no edema.  No arthritis, no gout  Lymphadenopathy:    He has no cervical adenopathy.  Neurological:  Unable to assess due to patient condition and sedation  Skin: Skin is warm and dry. No rash noted. No erythema.  Patient has some scattered ecchymosis especially prominent on his left arm  Psychiatric:  Unable to assess due to patient condition and sedation    LABORATORY PANEL:   CBC Recent Labs  Lab 03/22/2017 2012  WBC 13.4*  HGB 15.5  HCT 54.0*  PLT 379   ------------------------------------------------------------------------------------------------------------------  Chemistries  Recent Labs  Lab 03/31/2017 2012  NA 122*  K >7.5*  CL 84*  CO2 18*  GLUCOSE 1,184*  BUN 35*  CREATININE 3.09*  CALCIUM 8.5*  AST 4,855*  ALT 2,250*  ALKPHOS 279*  BILITOT 1.2   ------------------------------------------------------------------------------------------------------------------  Cardiac Enzymes No results for input(s): TROPONINI in the last 168 hours. ------------------------------------------------------------------------------------------------------------------  RADIOLOGY:  Dg Chest Portable 1 View  Result Date: 03/28/2017 CLINICAL DATA:  Intubation EXAM: PORTABLE CHEST 1 VIEW COMPARISON:  Portable exam 2013 hours compared to  01/08/2017 FINDINGS: Tip of endotracheal tube projects 4.0 cm above carina. Upper normal heart size. Azygos fissure. Mediastinal contours and pulmonary vascularity normal. Low lung volumes with mild LEFT basilar atelectasis. Additional opacity in retrocardiac LEFT lower lobe question infiltrate versus atelectasis. Upper lungs clear. No pleural effusion or pneumothorax. IMPRESSION: LEFT basilar atelectasis with atelectasis versus consolidation in retrocardiac LEFT lower lobe. Satisfactory endotracheal tube position. Electronically Signed   By: Ulyses Southward M.D.   On: 03/08/2017 20:40    EKG:   Orders placed or performed during the hospital encounter of 03/05/2017  . ED EKG  . ED EKG    IMPRESSION AND PLAN:  Principal Problem:   Severe sepsis with septic shock (HCC) -patient is on IV vasopressor, we have switched his sedated medication from propofol to Precedex and fentanyl with the hope of allowing his blood pressure to improve, he is on broad-spectrum IV antibiotics, lactic acid pending, urine and blood cultures sent, admit  to ICU with intensivist consult who is aware of the patient Active Problems:   DKA (diabetic ketoacidoses) (HCC) -IV insulin and other corrective orders including fluids and electrolyte management per DKA order set   Multi-organ failure with liver failure (HCC) -acute hepatic failure and acute renal failure, bilirubin is within normal limits, likely related to his shock, fluid and blood pressure support as above   Acute respiratory failure with hypercapnia (HCC) -patient is intubated, CO2 is likely to correct quickly, wean to extubation as possible  All the records are reviewed and case discussed with ED provider. Management plans discussed with the patient and/or family.  DVT PROPHYLAXIS: SubQ heparin  GI PROPHYLAXIS: H2 blocker  ADMISSION STATUS: Inpatient  CODE STATUS: Full Code Status History    This patient does not have a recorded code status. Please follow your  organizational policy for patients in this situation.      TOTAL CRITICAL CARE TIME TAKING CARE OF THIS PATIENT: 50 minutes.   Zanya Lindo FIELDING 03/11/2017, 10:47 PM  [facility]  Office  [phone]  CC: Primary care physician; Patient, No Pcp Per  Note:  This document was prepared using Dragon voice recognition software and may include unintentional dictation errors.

## 2017-03-10 NOTE — ED Notes (Signed)
EDP at bedside placing central line.  

## 2017-03-10 NOTE — ED Notes (Signed)
Pt transported to CT ?

## 2017-03-11 ENCOUNTER — Inpatient Hospital Stay: Payer: Medicaid Other

## 2017-03-11 ENCOUNTER — Inpatient Hospital Stay (HOSPITAL_COMMUNITY): Payer: Medicaid Other

## 2017-03-11 DIAGNOSIS — A419 Sepsis, unspecified organism: Secondary | ICD-10-CM

## 2017-03-11 DIAGNOSIS — G9341 Metabolic encephalopathy: Secondary | ICD-10-CM

## 2017-03-11 DIAGNOSIS — M6282 Rhabdomyolysis: Secondary | ICD-10-CM

## 2017-03-11 DIAGNOSIS — I998 Other disorder of circulatory system: Secondary | ICD-10-CM

## 2017-03-11 DIAGNOSIS — L8990 Pressure ulcer of unspecified site, unspecified stage: Secondary | ICD-10-CM

## 2017-03-11 DIAGNOSIS — R6521 Severe sepsis with septic shock: Secondary | ICD-10-CM

## 2017-03-11 DIAGNOSIS — I959 Hypotension, unspecified: Secondary | ICD-10-CM

## 2017-03-11 DIAGNOSIS — G253 Myoclonus: Secondary | ICD-10-CM

## 2017-03-11 DIAGNOSIS — R230 Cyanosis: Secondary | ICD-10-CM

## 2017-03-11 LAB — BASIC METABOLIC PANEL
ANION GAP: 17 — AB (ref 5–15)
Anion gap: 16 — ABNORMAL HIGH (ref 5–15)
Anion gap: 15 (ref 5–15)
Anion gap: 15 (ref 5–15)
BUN: 35 mg/dL — ABNORMAL HIGH (ref 6–20)
BUN: 33 mg/dL — AB (ref 6–20)
BUN: 37 mg/dL — AB (ref 6–20)
BUN: 39 mg/dL — AB (ref 6–20)
CHLORIDE: 105 mmol/L (ref 101–111)
CHLORIDE: 104 mmol/L (ref 101–111)
CHLORIDE: 103 mmol/L (ref 101–111)
CO2: 19 mmol/L — ABNORMAL LOW (ref 22–32)
CO2: 19 mmol/L — ABNORMAL LOW (ref 22–32)
CO2: 20 mmol/L — ABNORMAL LOW (ref 22–32)
CO2: 20 mmol/L — AB (ref 22–32)
CREATININE: 2.69 mg/dL — AB (ref 0.61–1.24)
CREATININE: 2.94 mg/dL — AB (ref 0.61–1.24)
CREATININE: 2.96 mg/dL — AB (ref 0.61–1.24)
Calcium: 8.1 mg/dL — ABNORMAL LOW (ref 8.9–10.3)
Calcium: 7.7 mg/dL — ABNORMAL LOW (ref 8.9–10.3)
Calcium: 7.8 mg/dL — ABNORMAL LOW (ref 8.9–10.3)
Calcium: 7.5 mg/dL — ABNORMAL LOW (ref 8.9–10.3)
Chloride: 101 mmol/L (ref 101–111)
Creatinine, Ser: 2.81 mg/dL — ABNORMAL HIGH (ref 0.61–1.24)
GFR calc Af Amer: 33 mL/min — ABNORMAL LOW (ref >60)
GFR calc Af Amer: 30 mL/min — ABNORMAL LOW (ref >60)
GFR calc Af Amer: 30 mL/min — ABNORMAL LOW (ref >60)
GFR calc non Af Amer: 29 mL/min — ABNORMAL LOW (ref >60)
GFR calc non Af Amer: 26 mL/min — ABNORMAL LOW (ref >60)
GFR calc non Af Amer: 26 mL/min — ABNORMAL LOW (ref >60)
GFR, EST AFRICAN AMERICAN: 32 mL/min — AB (ref >60)
GFR, EST NON AFRICAN AMERICAN: 27 mL/min — AB (ref >60)
GLUCOSE: 933 mg/dL — AB (ref 65–99)
Glucose, Bld: 702 mg/dL (ref 65–99)
Glucose, Bld: 628 mg/dL (ref 65–99)
Glucose, Bld: 585 mg/dL (ref 65–99)
POTASSIUM: 4.2 mmol/L (ref 3.5–5.1)
Potassium: 4.7 mmol/L (ref 3.5–5.1)
Potassium: 4.9 mmol/L (ref 3.5–5.1)
Potassium: 4.4 mmol/L (ref 3.5–5.1)
SODIUM: 140 mmol/L (ref 135–145)
SODIUM: 139 mmol/L (ref 135–145)
SODIUM: 138 mmol/L (ref 135–145)
Sodium: 137 mmol/L (ref 135–145)

## 2017-03-11 LAB — PROTIME-INR
INR: 1.34
PROTHROMBIN TIME: 16.5 s — AB (ref 11.4–15.2)

## 2017-03-11 LAB — RENAL FUNCTION PANEL
ANION GAP: 12 (ref 5–15)
Albumin: 2.3 g/dL — ABNORMAL LOW (ref 3.5–5.0)
Albumin: 2.3 g/dL — ABNORMAL LOW (ref 3.5–5.0)
Anion gap: 14 (ref 5–15)
BUN: 38 mg/dL — AB (ref 6–20)
BUN: 38 mg/dL — ABNORMAL HIGH (ref 6–20)
CHLORIDE: 105 mmol/L (ref 101–111)
CO2: 20 mmol/L — AB (ref 22–32)
CO2: 22 mmol/L (ref 22–32)
CREATININE: 2.75 mg/dL — AB (ref 0.61–1.24)
Calcium: 6.3 mg/dL — CL (ref 8.9–10.3)
Calcium: 7.2 mg/dL — ABNORMAL LOW (ref 8.9–10.3)
Chloride: 106 mmol/L (ref 101–111)
Creatinine, Ser: 2.62 mg/dL — ABNORMAL HIGH (ref 0.61–1.24)
GFR calc Af Amer: 33 mL/min — ABNORMAL LOW (ref >60)
GFR calc Af Amer: 34 mL/min — ABNORMAL LOW (ref >60)
GFR calc non Af Amer: 28 mL/min — ABNORMAL LOW (ref >60)
GFR calc non Af Amer: 30 mL/min — ABNORMAL LOW (ref >60)
GLUCOSE: 403 mg/dL — AB (ref 65–99)
GLUCOSE: 343 mg/dL — AB (ref 65–99)
Phosphorus: 5.7 mg/dL — ABNORMAL HIGH (ref 2.5–4.6)
Phosphorus: 6.6 mg/dL — ABNORMAL HIGH (ref 2.5–4.6)
Potassium: 3.4 mmol/L — ABNORMAL LOW (ref 3.5–5.1)
Potassium: 3.7 mmol/L (ref 3.5–5.1)
Sodium: 139 mmol/L (ref 135–145)
Sodium: 140 mmol/L (ref 135–145)

## 2017-03-11 LAB — LACTIC ACID, PLASMA
LACTIC ACID, VENOUS: 4.2 mmol/L — AB (ref 0.5–1.9)
LACTIC ACID, VENOUS: 4.8 mmol/L — AB (ref 0.5–1.9)
Lactic Acid, Venous: 5 mmol/L (ref 0.5–1.9)

## 2017-03-11 LAB — BLOOD GAS, ARTERIAL
ACID-BASE DEFICIT: 13 mmol/L — AB (ref 0.0–2.0)
ACID-BASE DEFICIT: 4.9 mmol/L — AB (ref 0.0–2.0)
Acid-base deficit: 7.8 mmol/L — ABNORMAL HIGH (ref 0.0–2.0)
BICARBONATE: 19.3 mmol/L — AB (ref 20.0–28.0)
Bicarbonate: 15.7 mmol/L — ABNORMAL LOW (ref 20.0–28.0)
Bicarbonate: 20.6 mmol/L (ref 20.0–28.0)
FIO2: 0.6
FIO2: 0.6
FIO2: 40
MECHANICAL RATE: 18
MECHVT: 650 mL
MECHVT: 650 mL
Mechanical Rate: 18
O2 Saturation: 95.5 %
O2 Saturation: 96.6 %
O2 Saturation: 97.1 %
PATIENT TEMPERATURE: 37
PCO2 ART: 45 mmHg (ref 32.0–48.0)
PEEP/CPAP: 5 cmH2O
PEEP: 5 cmH2O
PEEP: 5 cmH2O
PH ART: 7.15 — AB (ref 7.350–7.450)
PH ART: 7.33 — AB (ref 7.350–7.450)
Patient temperature: 37
Patient temperature: 37
RATE: 18 resp/min
RATE: 18 resp/min
VT: 650 mL
pCO2 arterial: 44 mmHg (ref 32.0–48.0)
pCO2 arterial: 39 mmHg (ref 32.0–48.0)
pH, Arterial: 7.25 — ABNORMAL LOW (ref 7.350–7.450)
pO2, Arterial: 100 mmHg (ref 83.0–108.0)
pO2, Arterial: 116 mmHg — ABNORMAL HIGH (ref 83.0–108.0)
pO2, Arterial: 84 mmHg (ref 83.0–108.0)

## 2017-03-11 LAB — PROCALCITONIN
PROCALCITONIN: 6.82 ng/mL
Procalcitonin: 8.94 ng/mL

## 2017-03-11 LAB — GLUCOSE, CAPILLARY
GLUCOSE-CAPILLARY: 560 mg/dL — AB (ref 65–99)
GLUCOSE-CAPILLARY: 512 mg/dL — AB (ref 65–99)
GLUCOSE-CAPILLARY: 467 mg/dL — AB (ref 65–99)
GLUCOSE-CAPILLARY: 380 mg/dL — AB (ref 65–99)
GLUCOSE-CAPILLARY: 388 mg/dL — AB (ref 65–99)
Glucose-Capillary: 332 mg/dL — ABNORMAL HIGH (ref 65–99)
Glucose-Capillary: 337 mg/dL — ABNORMAL HIGH (ref 65–99)
Glucose-Capillary: 341 mg/dL — ABNORMAL HIGH (ref 65–99)
Glucose-Capillary: 416 mg/dL — ABNORMAL HIGH (ref 65–99)
Glucose-Capillary: 574 mg/dL (ref 65–99)
Glucose-Capillary: 599 mg/dL (ref 65–99)
Glucose-Capillary: >600 mg/dL (ref 65–99)
Glucose-Capillary: >600 mg/dL (ref 65–99)
Glucose-Capillary: >600 mg/dL (ref 65–99)
Glucose-Capillary: >600 mg/dL (ref 65–99)
Glucose-Capillary: >600 mg/dL (ref 65–99)
Glucose-Capillary: >600 mg/dL (ref 65–99)
Glucose-Capillary: >600 mg/dL (ref 65–99)

## 2017-03-11 LAB — AST: AST: >10000 U/L — ABNORMAL HIGH (ref 15–41)

## 2017-03-11 LAB — TROPONIN I
TROPONIN I: 0.67 ng/mL — AB (ref <0.03)
TROPONIN I: 1.35 ng/mL — AB (ref <0.03)
Troponin I: 1.46 ng/mL (ref <0.03)

## 2017-03-11 LAB — MAGNESIUM
Magnesium: 2.1 mg/dL (ref 1.7–2.4)
Magnesium: 2.1 mg/dL (ref 1.7–2.4)

## 2017-03-11 LAB — CBC
HCT: 46.6 % (ref 40.0–52.0)
Hemoglobin: 14.7 g/dL (ref 13.0–18.0)
MCH: 27.3 pg (ref 26.0–34.0)
MCHC: 31.6 g/dL — AB (ref 32.0–36.0)
MCV: 86.2 fL (ref 80.0–100.0)
PLATELETS: 355 10*3/uL (ref 150–440)
RBC: 5.4 MIL/uL (ref 4.40–5.90)
RDW: 14.9 % — AB (ref 11.5–14.5)
WBC: 14.4 10*3/uL — AB (ref 3.8–10.6)

## 2017-03-11 LAB — CK
Total CK: 38049 U/L — ABNORMAL HIGH (ref 49–397)
Total CK: >50000 U/L — ABNORMAL HIGH (ref 49–397)

## 2017-03-11 LAB — AMYLASE: AMYLASE: 789 U/L — AB (ref 28–100)

## 2017-03-11 LAB — APTT: APTT: 27 s (ref 24–36)

## 2017-03-11 LAB — MRSA PCR SCREENING: MRSA by PCR: POSITIVE — AB

## 2017-03-11 LAB — LIPASE, BLOOD: LIPASE: 1539 U/L — AB (ref 11–51)

## 2017-03-11 LAB — ALT: ALT: 3543 U/L — ABNORMAL HIGH (ref 17–63)

## 2017-03-11 MED ORDER — POTASSIUM CHLORIDE 10 MEQ/50ML IV SOLN
10.0000 meq | INTRAVENOUS | Status: AC
  Administered 2017-03-11 – 2017-03-12 (×4): 10 meq via INTRAVENOUS
  Filled 2017-03-11 (×4): qty 50

## 2017-03-11 MED ORDER — SODIUM CHLORIDE 0.9 % IV SOLN
0.0000 ug/min | INTRAVENOUS | Status: DC
  Administered 2017-03-11 (×2): 20 ug/min via INTRAVENOUS
  Filled 2017-03-11 (×2): qty 10

## 2017-03-11 MED ORDER — ACETAMINOPHEN 325 MG PO TABS: 650.0000 mg | ORAL_TABLET | ORAL | Status: DC | PRN

## 2017-03-11 MED ORDER — ALTEPLASE 2 MG IJ SOLR
2.0000 mg | Freq: Once | INTRAMUSCULAR | Status: DC
  Filled 2017-03-11: qty 2

## 2017-03-11 MED ORDER — FENTANYL CITRATE (PF) 100 MCG/2ML IJ SOLN
50.0000 ug | Freq: Once | INTRAMUSCULAR | Status: AC
  Administered 2017-03-11: 50 ug via INTRAVENOUS

## 2017-03-11 MED ORDER — ALTEPLASE 2 MG IJ SOLR: 2.0000 mg | Freq: Once | INTRAMUSCULAR | Status: DC

## 2017-03-11 MED ORDER — DEXTROSE-NACL 5-0.45 % IV SOLN: INTRAVENOUS | Status: DC

## 2017-03-11 MED ORDER — HEPARIN BOLUS VIA INFUSION
6000.0000 [IU] | Freq: Once | INTRAVENOUS | Status: AC
  Administered 2017-03-11: 6000 [IU] via INTRAVENOUS
  Filled 2017-03-11: qty 6000

## 2017-03-11 MED ORDER — DEXTROSE 5 % IV SOLN: 0.0000 ug/min | INTRAVENOUS | Status: DC

## 2017-03-11 MED ORDER — NOREPINEPHRINE BITARTRATE 1 MG/ML IV SOLN: 0.0000 ug/min | INTRAVENOUS | Status: DC

## 2017-03-11 MED ORDER — DEXTROSE 5 % IV SOLN
2.0000 g | Freq: Two times a day (BID) | INTRAVENOUS | Status: DC
  Administered 2017-03-11: 2 g via INTRAVENOUS
  Filled 2017-03-11 (×2): qty 2

## 2017-03-11 MED ORDER — HEPARIN (PORCINE) IN NACL 100-0.45 UNIT/ML-% IJ SOLN
2400.0000 [IU]/h | INTRAMUSCULAR | Status: DC
  Administered 2017-03-11: 1700 [IU]/h via INTRAVENOUS
  Administered 2017-03-12 – 2017-03-18 (×14): 2400 [IU]/h via INTRAVENOUS
  Filled 2017-03-11 (×16): qty 250

## 2017-03-11 MED ORDER — DEXTROSE 5 % IV SOLN
1000.0000 mg | Freq: Three times a day (TID) | INTRAVENOUS | Status: DC
  Filled 2017-03-11 (×2): qty 20

## 2017-03-11 MED ORDER — VASOPRESSIN 20 UNIT/ML IV SOLN
0.0300 [IU]/min | INTRAVENOUS | Status: DC
  Administered 2017-03-11 – 2017-03-13 (×3): 0.03 [IU]/min via INTRAVENOUS
  Filled 2017-03-11 (×4): qty 2

## 2017-03-11 MED ORDER — HYDROCORTISONE NA SUCCINATE PF 100 MG IJ SOLR
50.0000 mg | Freq: Four times a day (QID) | INTRAMUSCULAR | Status: DC
  Administered 2017-03-11 – 2017-03-15 (×18): 50 mg via INTRAVENOUS
  Filled 2017-03-11 (×18): qty 2

## 2017-03-11 MED ORDER — HEPARIN SODIUM (PORCINE) 5000 UNIT/ML IJ SOLN: 5000.0000 [IU] | Freq: Three times a day (TID) | INTRAMUSCULAR | Status: DC

## 2017-03-11 MED ORDER — ONDANSETRON HCL 4 MG/2ML IJ SOLN: 4.0000 mg | Freq: Four times a day (QID) | INTRAMUSCULAR | Status: DC | PRN

## 2017-03-11 MED ORDER — ACYCLOVIR SODIUM 50 MG/ML IV SOLN
1000.0000 mg | Freq: Two times a day (BID) | INTRAVENOUS | Status: DC
  Administered 2017-03-11 – 2017-03-14 (×8): 1000 mg via INTRAVENOUS
  Filled 2017-03-11 (×10): qty 20

## 2017-03-11 MED ORDER — ORAL CARE MOUTH RINSE
15.0000 mL | OROMUCOSAL | Status: DC
  Administered 2017-03-11 – 2017-03-20 (×89): 15 mL via OROMUCOSAL

## 2017-03-11 MED ORDER — MIDAZOLAM HCL 2 MG/2ML IJ SOLN
2.0000 mg | INTRAMUSCULAR | Status: AC | PRN
  Administered 2017-03-11 (×3): 2 mg via INTRAVENOUS
  Filled 2017-03-11 (×4): qty 2

## 2017-03-11 MED ORDER — SODIUM CHLORIDE 0.9 % IV BOLUS (SEPSIS)
500.0000 mL | Freq: Once | INTRAVENOUS | Status: AC
  Administered 2017-03-11: 500 mL via INTRAVENOUS

## 2017-03-11 MED ORDER — ACETAMINOPHEN 650 MG RE SUPP
650.0000 mg | Freq: Four times a day (QID) | RECTAL | Status: DC | PRN
  Administered 2017-03-11: 650 mg via RECTAL
  Filled 2017-03-11 (×2): qty 1

## 2017-03-11 MED ORDER — SODIUM CHLORIDE 0.9 % IV SOLN
1.0000 g | Freq: Once | INTRAVENOUS | Status: AC
  Administered 2017-03-11: 1 g via INTRAVENOUS
  Filled 2017-03-11: qty 10

## 2017-03-11 MED ORDER — SODIUM CHLORIDE 0.9 % IV SOLN: INTRAVENOUS | Status: DC

## 2017-03-11 MED ORDER — SODIUM BICARBONATE 8.4 % IV SOLN
100.0000 meq | Freq: Once | INTRAVENOUS | Status: AC
  Administered 2017-03-11: 100 meq via INTRAVENOUS

## 2017-03-11 MED ORDER — ACETAMINOPHEN 650 MG RE SUPP
650.0000 mg | RECTAL | Status: DC | PRN
  Administered 2017-03-11: 650 mg via RECTAL

## 2017-03-11 MED ORDER — FAMOTIDINE IN NACL 20-0.9 MG/50ML-% IV SOLN
20.0000 mg | Freq: Two times a day (BID) | INTRAVENOUS | Status: DC
  Administered 2017-03-11 – 2017-03-15 (×8): 20 mg via INTRAVENOUS
  Filled 2017-03-11 (×8): qty 50

## 2017-03-11 MED ORDER — PUREFLOW DIALYSIS SOLUTION
INTRAVENOUS | Status: DC
  Administered 2017-03-12 – 2017-03-13 (×4): via INTRAVENOUS_CENTRAL
  Administered 2017-03-14: 1500 mL via INTRAVENOUS_CENTRAL

## 2017-03-11 MED ORDER — STERILE WATER FOR INJECTION IV SOLN
INTRAVENOUS | Status: DC
  Administered 2017-03-11 – 2017-03-12 (×4): via INTRAVENOUS
  Filled 2017-03-11 (×4): qty 850

## 2017-03-11 MED ORDER — VANCOMYCIN HCL 10 G IV SOLR
1500.0000 mg | Freq: Once | INTRAVENOUS | Status: AC
  Administered 2017-03-11: 1500 mg via INTRAVENOUS
  Filled 2017-03-11: qty 1500

## 2017-03-11 MED ORDER — MIDAZOLAM HCL 2 MG/2ML IJ SOLN
2.0000 mg | INTRAMUSCULAR | Status: DC | PRN
  Administered 2017-03-11 – 2017-03-12 (×8): 2 mg via INTRAVENOUS
  Filled 2017-03-11 (×10): qty 2

## 2017-03-11 MED ORDER — SODIUM CHLORIDE 0.9 % IV SOLN
INTRAVENOUS | Status: DC
  Administered 2017-03-11: 19.4 [IU]/h via INTRAVENOUS
  Administered 2017-03-11: 16.2 [IU]/h via INTRAVENOUS
  Administered 2017-03-11: 27.1 [IU]/h via INTRAVENOUS
  Administered 2017-03-12: 22.6 [IU]/h via INTRAVENOUS
  Administered 2017-03-12: 19.4 [IU]/h via INTRAVENOUS
  Administered 2017-03-12: 13.6 [IU]/h via INTRAVENOUS
  Administered 2017-03-13: 16:00:00 via INTRAVENOUS
  Administered 2017-03-13: 22.9 [IU]/h via INTRAVENOUS
  Administered 2017-03-13: 24.7 [IU]/h via INTRAVENOUS
  Administered 2017-03-14: 16.4 [IU]/h via INTRAVENOUS
  Administered 2017-03-14: 15.1 [IU]/h via INTRAVENOUS
  Administered 2017-03-14: 19.1 [IU]/h via INTRAVENOUS
  Administered 2017-03-15: 20.5 [IU]/h via INTRAVENOUS
  Administered 2017-03-15: 23:00:00 via INTRAVENOUS
  Administered 2017-03-15: 16.7 [IU]/h via INTRAVENOUS
  Administered 2017-03-15: 21.6 [IU]/h via INTRAVENOUS
  Administered 2017-03-16: 10.1 [IU]/h via INTRAVENOUS
  Administered 2017-03-16: 8.7 [IU]/h via INTRAVENOUS
  Administered 2017-03-17: 8.5 [IU]/h via INTRAVENOUS
  Administered 2017-03-17: 10.7 [IU]/h via INTRAVENOUS
  Administered 2017-03-18: 18:00:00 via INTRAVENOUS
  Administered 2017-03-18: 10.7 [IU]/h via INTRAVENOUS
  Filled 2017-03-11 (×30): qty 1

## 2017-03-11 MED ORDER — NOREPINEPHRINE BITARTRATE 1 MG/ML IV SOLN
0.0000 ug/min | INTRAVENOUS | Status: DC
  Administered 2017-03-11 (×2): 40 ug/min via INTRAVENOUS
  Administered 2017-03-11: 30 ug/min via INTRAVENOUS
  Administered 2017-03-11: 40 ug/min via INTRAVENOUS
  Administered 2017-03-12: 20 ug/min via INTRAVENOUS
  Administered 2017-03-13: 30 ug/min via INTRAVENOUS
  Filled 2017-03-11 (×7): qty 16

## 2017-03-11 MED ORDER — DEXTROSE 5 % IV SOLN
2.0000 g | Freq: Two times a day (BID) | INTRAVENOUS | Status: DC
  Administered 2017-03-11 – 2017-03-15 (×8): 2 g via INTRAVENOUS
  Filled 2017-03-11 (×10): qty 2

## 2017-03-11 MED ORDER — VANCOMYCIN HCL 10 G IV SOLR: 1500.0000 mg | INTRAVENOUS | Status: DC

## 2017-03-11 MED ORDER — CHLORHEXIDINE GLUCONATE 0.12% ORAL RINSE (MEDLINE KIT)
15.0000 mL | Freq: Two times a day (BID) | OROMUCOSAL | Status: DC
  Administered 2017-03-11 – 2017-03-20 (×19): 15 mL via OROMUCOSAL

## 2017-03-11 MED ORDER — SODIUM CHLORIDE 0.9 % IV SOLN: 250.0000 mL | INTRAVENOUS | Status: DC | PRN

## 2017-03-11 MED ORDER — HEPARIN SODIUM (PORCINE) 1000 UNIT/ML DIALYSIS
1000.0000 [IU] | INTRAMUSCULAR | Status: DC | PRN
  Administered 2017-03-11 (×2): 1300 [IU] via INTRAVENOUS_CENTRAL
  Administered 2017-03-12: 2000 [IU] via INTRAVENOUS_CENTRAL
  Filled 2017-03-11: qty 1
  Filled 2017-03-11: qty 3
  Filled 2017-03-11: qty 6
  Filled 2017-03-11: qty 1

## 2017-03-11 MED ORDER — VANCOMYCIN HCL 10 G IV SOLR
1500.0000 mg | INTRAVENOUS | Status: DC
  Administered 2017-03-11 – 2017-03-13 (×4): 1500 mg via INTRAVENOUS
  Filled 2017-03-11 (×6): qty 1500

## 2017-03-11 MED ORDER — FAMOTIDINE IN NACL 20-0.9 MG/50ML-% IV SOLN: 20.0000 mg | INTRAVENOUS | Status: DC

## 2017-03-11 NOTE — Progress Notes (Signed)
Maggie notified of potassium and calcium levels. Orders for replacement. Started CRRT immediately after patients vasc cath line placed but received red alarm of high access pressures. Attempted to reposition patient, switch lines, slow blood flow rate but continued getting high pressures. Called help line and they asked me how long CRRT was running. It had been less then 1 minute. Representative stated that he believed it was the vasc cath placement and it needed to be replaced. Good blood return noted. Per Maggie heparin placed in both lines and orders to attempt CRRT again with new cartridge. Heparin pulled back when CRRT  restarted . Once restarted CRRT machine continued to have high access pressures. Per Annabelle Harman NP will place new vasc catheter.  Per Maggie titrated Neo off and kept levophed and vasopressin. Per Maggie titrated fentanyl up to keep patient comfortable and versed given as well.

## 2017-03-11 NOTE — Consult Note (Signed)
[facility]  CONSULT NOTE    Date: 03/11/2017                  Patient Name:  Zachary Graves  MRN: [id number]  DOB: 11/11/1980  Age / Sex: 37 y.o., male         PCP: Patient, No Pcp Per                 Service Requesting Consult: Dr. Jefferson Fuel                 Reason for Consult: Acute Renal Failure            History of Present Illness: Mr. Zachary Graves is a 37 y.o. white male with insulin dependent diabetes mellitus, hypertension, morbid obesity, diabetic peripheral neuropathy, Legg-calve-Perthes Disease, osteoarthritis, who was admitted to [facility] on 03/30/2017 for AKI (acute kidney injury) ([place]) [N17.9] Hypotension, unspecified hypotension type [I95.9] Diabetic ketoacidosis with coma associated with type 1 diabetes mellitus ([place]) [E10.11] Acute liver failure with hepatic coma ([place]) [K72.01]   Patient intubated and sedated. History taken from chart.   Patient with baseline creatinine of 0.9. Was nonoliguric yesterday. However became anuric. Requiring vasopressors. Empiric antibiotics.    Medications: Outpatient medications: Medications Prior to Admission  Medication Sig Dispense Refill Last Dose  . doxycycline (VIBRAMYCIN) 100 MG capsule Take 100 mg by mouth 2 (two) times daily. For 14 days   Unknown at Unknown  . DULoxetine (CYMBALTA) 30 MG capsule Take 30 mg by mouth daily.  2 Unknown at Unknown  . gabapentin (NEURONTIN) 800 MG tablet Take 800 mg by mouth 4 (four) times daily.  5 Unknown at Unknown  . ibuprofen (ADVIL,MOTRIN) 800 MG tablet Take 800 mg by mouth every 8 (eight) hours as needed. For pain   prn at prn  . ondansetron (ZOFRAN-ODT) 4 MG disintegrating tablet Take 1 tablet (4 mg total) every 8 (eight) hours as needed by mouth for nausea or vomiting. 8 tablet 0 prn at prn  . Oxycodone HCl 20 MG TABS Take 1 tablet by mouth every 6 (six) hours as needed.  0 prn at prn  . OXYCONTIN 60 MG 12 hr tablet Take 60 mg by mouth every 12 (twelve) hours.  0  Unknown at Unknown  . ranitidine (ZANTAC) 75 MG tablet Take 75 mg by mouth daily as needed for heartburn.   prn at prn  . tiZANidine (ZANAFLEX) 4 MG tablet Take 4 mg by mouth 4 (four) times daily.   Unknown at Unknown  . ATENOLOL PO Take by mouth.   Not Taking at Unknown time  . Phenylephrine-DM-GG-APAP (TYLENOL COLD/FLU SEVERE) 5-10-200-325 MG TABS Take 2 tablets by mouth every 4 (four) hours as needed. For fever and nausea   Not Taking at Unknown time    Current medications: Current Facility-Administered Medications  Medication Dose Route Frequency Provider Last Rate Last Dose  . 0.9 %  sodium chloride infusion  250 mL Intravenous PRN Lance Coon, MD      . acetaminophen (TYLENOL) suppository 650 mg  650 mg Rectal Q4H PRN Conforti, John, DO   650 mg at 03/11/17 0916  . acyclovir (ZOVIRAX) 1,000 mg in dextrose 5 % 150 mL IVPB  1,000 mg Intravenous Q12H Fritzi Mandes, MD      . cefTRIAXone (ROCEPHIN) 2 g in dextrose 5 % 50 mL IVPB  2 g Intravenous Q12H Conforti, John, DO 100 mL/hr at 03/11/17 1503 2 g at 03/11/17 1503  . chlorhexidine gluconate (  MEDLINE KIT) (PERIDEX) 0.12 % solution 15 mL  15 mL Mouth Rinse BID Awilda Bill, NP   15 mL at 03/11/17 0858  . dexmedetomidine (PRECEDEX) 400 MCG/100ML (4 mcg/mL) infusion  0.4-1.2 mcg/kg/hr Intravenous Titrated Lance Coon, MD   Stopped at 03/11/17 1016  . dextrose 5 %-0.45 % sodium chloride infusion   Intravenous Continuous Lance Coon, MD      . famotidine (PEPCID) IVPB 20 mg premix  20 mg Intravenous Laurence Spates, MD   Stopped at 03/11/17 1007  . fentaNYL 254mg in NS 2542m(105mml) infusion-PREMIX  0-400 mcg/hr Intravenous Continuous WilLance CoonD 5 mL/hr at 03/11/17 1304 50 mcg/hr at 03/11/17 1304  . heparin ADULT infusion 100 units/mL (25000 units/250m78mdium chloride 0.45%)  1,700 Units/hr Intravenous Continuous PateFritzi Mandes      . heparin bolus via infusion 6,000 Units  6,000 Units Intravenous Once PateFritzi Mandes       . heparin injection 1,000-6,000 Units  1,000-6,000 Units CRRT PRN Zakyra Kukuk, MD      . hydrocortisone sodium succinate (SOLU-CORTEF) 100 MG injection 50 mg  50 mg Intravenous Q6H BlakAwilda Bill   50 mg at 03/11/17 1235  . insulin regular (NOVOLIN R,HUMULIN R) 100 Units in sodium chloride 0.9 % 100 mL (1 Units/mL) infusion   Intravenous Continuous WillLance Coon 27.1 mL/hr at 03/11/17 1459 27.1 Units/hr at 03/11/17 1459  . MEDLINE mouth rinse  15 mL Mouth Rinse 10 times per day BlakAwilda Bill   15 mL at 03/11/17 1237  . midazolam (VERSED) injection 2 mg  2 mg Intravenous Q15 min PRN BlakAwilda Bill      . midazolam (VERSED) injection 2 mg  2 mg Intravenous Q2H PRN BlakAwilda Bill      . norepinephrine (LEVOPHED) 16 mg in dextrose 5 % 250 mL (0.064 mg/mL) infusion  0-40 mcg/min Intravenous Continuous BlakAwilda Bill 14.1 mL/hr at 03/11/17 1121 15 mcg/min at 03/11/17 1121  . ondansetron (ZOFRAN) injection 4 mg  4 mg Intravenous Q6H PRN WillLance Coon      . phenylephrine (NEO-SYNEPHRINE) 10 mg in sodium chloride 0.9 % 250 mL (0.04 mg/mL) infusion  0-400 mcg/min Intravenous Titrated Conforti, John, DO 30 mL/hr at 03/11/17 1457 20 mcg/min at 03/11/17 1457  . pureflow IV solution for Dialysis   CRRT Continuous Alexsia Klindt, MD      . sodium bicarbonate 150 mEq in sterile water 1,000 mL infusion   Intravenous Continuous BlakAwilda Bill 100 mL/hr at 03/11/17 1151    . sodium chloride 0.9 % bolus 1,000 mL  1,000 mL Intravenous Once PaduHarvest Dark       And  . sodium chloride 0.9 % bolus 1,000 mL  1,000 mL Intravenous Once PaduHarvest Dark       And  . sodium chloride 0.9 % bolus 1,000 mL  1,000 mL Intravenous Once PaduHarvest Dark       And  . sodium chloride 0.9 % bolus 1,000 mL  1,000 mL Intravenous Once PaduHarvest Dark       And  . sodium chloride 0.9 % bolus 500 mL  500 mL Intravenous Once PaduHarvest Dark      .  vancomycin (VANCOCIN) 1,500 mg in sodium chloride 0.9 % 500 mL IVPB  1,500 mg Intravenous Q18H BlakAwilda Bill   Stopped at 03/11/17 1444  . vasopressin (PITRESSIN) 40 Units in sodium chloride  0.9 % 250 mL (0.16 Units/mL) infusion  0.03 Units/min Intravenous Continuous Awilda Bill, NP 11.3 mL/hr at 03/11/17 0150 0.03 Units/min at 03/11/17 0150      Allergies: Allergies  Allergen Reactions  . Naproxen   . Sulfa Antibiotics Hives      Past Medical History: Past Medical History:  Diagnosis Date  . Back pain   . Diabetes 1.5, managed as type 1 ([place])   . Legg-Calve-Perthes disease   . Neuropathic pain   . Obesity   . Osteoarthritis      Past Surgical History: Past Surgical History:  Procedure Laterality Date  . LEG SURGERY       Family History: Family History  Problem Relation Age of Onset  . Diabetes Mother   . Hypertension Mother   . Diabetes Father   . Hypertension Father      Social History: Social History   Socioeconomic History  . Marital status: Single    Spouse name: Not on file  . Number of children: Not on file  . Years of education: Not on file  . Highest education level: Not on file  Social Needs  . Financial resource strain: Not on file  . Food insecurity - worry: Not on file  . Food insecurity - inability: Not on file  . Transportation needs - medical: Not on file  . Transportation needs - non-medical: Not on file  Occupational History  . Not on file  Tobacco Use  . Smoking status: Never Smoker  . Smokeless tobacco: Never Used  Substance and Sexual Activity  . Alcohol use: No  . Drug use: No  . Sexual activity: Not on file  Other Topics Concern  . Not on file  Social History Narrative  . Not on file     Review of Systems: Review of Systems  Unable to perform ROS: Critical illness    Vital Signs: Blood pressure 92/63, pulse 79, temperature 100.3 F (37.9 C), resp. rate 18, height 5' 9"  (1.753 m), weight (!) 146.1 kg  (322 lb 1.5 oz), SpO2 97 %.  Weight trends: Filed Weights   04/01/2017 2033 03/11/17 0010 03/11/17 0453  Weight: (!) 145.2 kg (320 lb) (!) 146.1 kg (322 lb 1.5 oz) (!) 146.1 kg (322 lb 1.5 oz)    Physical Exam: General: Critically Ill  Head: Normocephalic, atraumatic. Moist oral mucosal membranes  Eyes: Anicteric, PERRL  Neck: Supple, trachea midline  Lungs:  PRVC FiO2 40%  Heart: Regular rate and rhythm  Abdomen:  Soft, nontender, obese  Extremities: + peripheral edema. +cyanosis  Neurologic: Intubated, sedated  Skin: +ecchymosis  Access: Left femoral temp HD catheter. 03/11/17     Lab results: Basic Metabolic Panel: Recent Labs  Lab 03/11/17 0428 03/11/17 0840 03/11/17 1241  NA 140 139 138  K 4.7 4.9 4.4  CL 105 104 103  CO2 19* 20* 20*  GLUCOSE 702* 628* 585*  BUN 33* 37* 39*  CREATININE 2.69* 2.94* 2.96*  CALCIUM 7.7* 7.8* 7.5*    Liver Function Tests: Recent Labs  Lab 03/24/2017 2012 03/11/17 0428  AST 4,855* >10,000*  ALT 2,250* 3,543*  ALKPHOS 279*  --   BILITOT 1.2  --   PROT 8.3*  --   ALBUMIN 3.6  --    No results for input(s): LIPASE, AMYLASE in the last 168 hours. No results for input(s): AMMONIA in the last 168 hours.  CBC: Recent Labs  Lab 03/31/2017 2012 03/11/17 0428  WBC 13.4* 14.4*  NEUTROABS 11.5*  --  HGB 15.5 14.7  HCT 54.0* 46.6  MCV 94.1 86.2  PLT 379 355    Cardiac Enzymes: Recent Labs  Lab 03/15/2017 2012 03/11/17 0113 03/11/17 0840 03/11/17 1241  CKTOTAL 38,049*  --   --   --   TROPONINI  --  0.67* 1.46* 1.35*    BNP: Invalid input(s): POCBNP  CBG: Recent Labs  Lab 03/11/17 0905 03/11/17 1012 03/11/17 1118 03/11/17 1229 03/11/17 1453  GLUCAP >600* 599* 560* 574* >600*    Microbiology: Results for orders placed or performed during the hospital encounter of 03/28/2017  Blood Culture (routine x 2)     Status: None (Preliminary result)   Collection Time: 03/09/2017 11:28 PM  Result Value Ref Range Status    Specimen Description BLOOD LT [facility]  Final   Special Requests   Final    BOTTLES DRAWN AEROBIC AND ANAEROBIC Blood Culture adequate volume   Culture   Final    NO GROWTH < 12 HOURS Performed at [facility], [address], [place], [place] [address]    Report Status PENDING  Incomplete  Blood Culture (routine x 2)     Status: None (Preliminary result)   Collection Time: 03/28/2017 11:28 PM  Result Value Ref Range Status   Specimen Description BLOOD RT [facility]  Final   Special Requests   Final    BOTTLES DRAWN AEROBIC AND ANAEROBIC Blood Culture adequate volume   Culture   Final    NO GROWTH < 12 HOURS Performed at [facility], [address], [place], [place] [address]    Report Status PENDING  Incomplete  MRSA PCR Screening     Status: Abnormal   Collection Time: 03/11/17 12:20 AM  Result Value Ref Range Status   MRSA by PCR POSITIVE (A) NEGATIVE Final    Comment:        The GeneXpert MRSA Assay (FDA approved for NASAL specimens only), is one component of a comprehensive MRSA colonization surveillance program. It is not intended to diagnose MRSA infection nor to guide or monitor treatment for MRSA infections. RESULT CALLED TO, READ BACK BY AND VERIFIED WITH: BARBARA THAO ON 03/11/17 AT 0310 JAG Performed at [facility], [address] [place] [employer], [place] [address]     Coagulation Studies: Recent Labs    03/11/17 1116  LABPROT 16.5*  INR 1.34    Urinalysis: Recent Labs    03/22/2017 2012  COLORURINE YELLOW*  LABSPEC 1.024  PHURINE 6.0  GLUCOSEU >=500*  HGBUR LARGE*  BILIRUBINUR NEGATIVE  KETONESUR NEGATIVE  PROTEINUR 30*  NITRITE NEGATIVE  LEUKOCYTESUR NEGATIVE      Imaging: Dg Ankle Complete Right  Result Date: 03/11/2017 CLINICAL DATA:  Osteomyelitis of the right ankle. EXAM: RIGHT ANKLE - COMPLETE 3+ VIEW COMPARISON:  12/07/2016 FINDINGS: Soft tissue swelling and possibly ulceration along the medial aspect of the right ankle.  Periosteal thickening is identified along the distal diaphysis of the tibia and may reflect stigmata of chronic venous stasis or chronic osteomyelitis, favor venous stasis given mild-to-moderate soft tissue edema and vascular clips about the midfoot. No frank bone destruction, fracture or joint dislocation. Calcaneal enthesopathy is noted posteriorly. IMPRESSION: Periosteal new bone formation along the diaphysis of the tibia more likely to represent stigmata of chronic venous stasis. Chronic osteomyelitis could also have a similar appearance. No conclusive evidence for acute osteomyelitis or fracture. Electronically Signed   By: Ashley Royalty M.D.   On: 03/11/2017 01:32   Dg Abd 1 View  Result  Date: 03/11/2017 CLINICAL DATA:  Second attempt and orogastric tube placement. EXAM: ABDOMEN - 1 VIEW COMPARISON:  03/11/2017 at 0036 hours FINDINGS: The tip and side port of a gastric tube are visualized below the left hemidiaphragm. The side-port is just slightly caudal to the expected location of the GE junction. No free air or bowel dilatation is visualized. IMPRESSION: The tip and side port of a gastric tube are seen in the expected location of the stomach. The side port however is just slightly caudal to the level of the GE junction. Further advancement by 3-4 cm would help improve positioning. Electronically Signed   By: Ashley Royalty M.D.   On: 03/11/2017 02:48   Dg Abd 1 View  Result Date: 03/11/2017 CLINICAL DATA:  Orogastric tube placement EXAM: ABDOMEN - 1 VIEW COMPARISON:  None. FINDINGS: The tip of the orogastric tube overlies the gastric body. The side port is just distal to the gastroesophageal junction. Advancement by 5-7 cm is recommended. Normal bowel gas pattern. IMPRESSION: Recommend advancement of the orogastric tube by 5-7 cm to ensure that the side port is within the stomach. Electronically Signed   By: Ulyses Jarred M.D.   On: 03/11/2017 01:25   Ct Head Wo Contrast  Result Date:  03/11/2017 CLINICAL DATA:  37 year old male with altered mental status. EXAM: CT HEAD WITHOUT CONTRAST TECHNIQUE: Contiguous axial images were obtained from the base of the skull through the vertex without intravenous contrast. COMPARISON:  None. FINDINGS: Brain: No evidence of acute infarction, hemorrhage, hydrocephalus, extra-axial collection or mass lesion/mass effect. Vascular: No hyperdense vessel or unexpected calcification. Skull: Normal. Negative for fracture or focal lesion. Sinuses/Orbits: There is diffuse mucoperiosteal thickening of paranasal sinuses with near complete opacification of the left maxillary sinus. The mastoid air cells are clear. Other: There is moderate left temporal subcutaneous hematoma. Correlation with clinical exam recommended. Small pockets of air in the right masticator space likely intravascular air and iatrogenic. IMPRESSION: 1. Normal noncontrast CT of the brain. 2. Moderate left temporal subcutaneous hematoma. 3. Paranasal sinus disease. Electronically Signed   By: Anner Crete M.D.   On: 03/11/2017 00:29   US Venous Img Upper Uni Left  Result Date: 03/11/2017 CLINICAL DATA:  Left upper extremity swelling.  Evaluate for DVT. EXAM: LEFT UPPER EXTREMITY VENOUS DOPPLER ULTRASOUND TECHNIQUE: Gray-scale sonography with graded compression, as well as color Doppler and duplex ultrasound were performed to evaluate the upper extremity deep venous system from the level of the subclavian vein and including the jugular, axillary, basilic, radial, ulnar and upper cephalic vein. Spectral Doppler was utilized to evaluate flow at rest and with distal augmentation maneuvers. COMPARISON:  None. FINDINGS: Contralateral Subclavian Vein: Respiratory phasicity is normal and symmetric with the symptomatic side. No evidence of thrombus. Normal compressibility. Internal Jugular Vein: No evidence of thrombus. Normal compressibility, respiratory phasicity and response to augmentation. Subclavian  Vein: There is hypoechoic occlusive thrombus involving the peripheral aspect of the left subclavian vein (image 9 and 10). The central aspect the left subclavian vein appears patent where imaged. Axillary Vein: There is hypoechoic occlusive thrombus within the left axillary vein (image 13) extending to involve one of the paired brachial veins (images 25 - 27). Cephalic Vein: No evidence of thrombus. Normal compressibility, respiratory phasicity and response to augmentation. Basilic Vein: No evidence of thrombus. Normal compressibility, respiratory phasicity and response to augmentation. Brachial Veins: There is hypoechoic occlusive thrombus within 1 of the paired left brachial veins (images 25-27. The adjacent paired brachial vein appears  widely patent. Radial Veins: No evidence of thrombus. Normal compressibility, respiratory phasicity and response to augmentation. Ulnar Veins: No evidence of thrombus. Normal compressibility, respiratory phasicity and response to augmentation. Other Findings:  None visualized. IMPRESSION: The examination is positive for occlusive DVT extending from the peripheral aspect of the left subclavian vein to involve the left axillary and one of the paired left brachial veins. These results will be called to the ordering clinician or representative by the Radiologist Assistant, and communication documented in the PACS or zVision Dashboard. Electronically Signed   By: Sandi Mariscal M.D.   On: 03/11/2017 13:16   Dg Chest Port 1 View  Result Date: 03/11/2017 CLINICAL DATA:  Acute respiratory failure. EXAM: PORTABLE CHEST 1 VIEW COMPARISON:  Chest x-ray from yesterday. FINDINGS: Endotracheal tube remains in place with the tip approximately 2.3 cm above the level of the carina. Unchanged right internal jugular central venous catheter with the tip in the distal SVC. The cardiomediastinal silhouette is normal in size. Normal pulmonary vascularity. Low lung volumes with unchanged retrocardiac  opacity at the left lung base. No pleural effusion or pneumothorax. No acute osseous abnormality. IMPRESSION: 1. Persistent low lung volumes with unchanged retrocardiac opacity, atelectasis versus infiltrate. Electronically Signed   By: Titus Dubin M.D.   On: 03/11/2017 08:34   Dg Chest Portable 1 View  Result Date: 03/06/2017 CLINICAL DATA:  Central line placement EXAM: PORTABLE CHEST 1 VIEW COMPARISON:  2013 hours FINDINGS: Low lung volumes with atelectasis at the lung bases. Retrocardiac consolidation either representing a small focus of pneumonia or atelectasis is identified at the left lung base. Endotracheal tube tip is 3.1 cm above the carina. Right IJ central line catheter is seen in the distal SVC. No pneumothorax is noted. Azygos fissure is of incidental note. No acute osseous abnormality. IMPRESSION: 1. New right IJ catheter with tip in the distal SVC. No pneumothorax. 2. Endotracheal tube in satisfactory position. 3. Low lung volumes with atelectasis. Retrocardiac opacity is noted which likely represents atelectasis. A small focus of pneumonia is not entirely excluded. Electronically Signed   By: Ashley Royalty M.D.   On: 03/09/2017 23:12   Dg Chest Portable 1 View  Result Date: 03/27/2017 CLINICAL DATA:  Intubation EXAM: PORTABLE CHEST 1 VIEW COMPARISON:  Portable exam 2013 hours compared to 01/08/2017 FINDINGS: Tip of endotracheal tube projects 4.0 cm above carina. Upper normal heart size. Azygos fissure. Mediastinal contours and pulmonary vascularity normal. Low lung volumes with mild LEFT basilar atelectasis. Additional opacity in retrocardiac LEFT lower lobe question infiltrate versus atelectasis. Upper lungs clear. No pleural effusion or pneumothorax. IMPRESSION: LEFT basilar atelectasis with atelectasis versus consolidation in retrocardiac LEFT lower lobe. Satisfactory endotracheal tube position. Electronically Signed   By: Lavonia Dana M.D.   On: 03/06/2017 20:40      Assessment &  Plan: Mr. Yehudah Standing is a 37 y.o. white male with insulin dependent diabetes mellitus, hypertension, morbid obesity, diabetic peripheral neuropathy, Legg-calve-Perthes Disease, osteoarthritis, who was admitted to [facility] on 03/20/2017 for AKI (acute kidney injury) ([place]) [N17.9] Hypotension, unspecified hypotension type [I95.9] Diabetic ketoacidosis with coma associated with type 1 diabetes mellitus ([place]) [E10.11] Acute liver failure with hepatic coma ([place]) [K72.01]   1. Acute Renal Failure 2. Diabetic ketoacidosis/metabolic acidosis  3. Shock: with suspected sepsis 4. Rhabdomyolysis 5. Transaminitis  Baseline creatinine of 0.94, normal GFR on 01/08/17.  Now with anuric renal failure.  Start CRRT: CVVHD therapy rate 1500, BFR 250, UF 0.  - Check amylase and lipase -  Check hemoglobin A1c    LOS: 1 Susie Ehresman 1/10/20193:14 PM

## 2017-03-11 NOTE — Consult Note (Signed)
[facility] Vascular Consult Note  MRN : [id number]  Zachary Graves is a 37 y.o. (1980/05/09) male who presents with chief complaint of  Chief Complaint  Patient presents with  . Loss of Consciousness  .  History of Present Illness: I am asked to see the patient by Dr. Jefferson Fuel for LUE ischemia.  He was noticed to have purplish discoloration of the left hand, wrist, and several fingers this morning.  He was admitted last night to the ICU with multisystem organ failure.  He has profound hypotension and is on Neo-Synephrine, vasopressin, and levofed.  Despite this, he remains hypotensive even with a CVP approaching 20.  He is being treated with broad-spectrum antibiotics.  He is intubated and sedated and unable to provide any history so this is obtained from the previous medical records.  Part of his admission issue was DKA and he apparently developed loss of consciousness and was laying on the floor for an unknown.  He has a purplish mark on his temple and forehead which is apparently from laying on the ground.  He also has rhabdomyolysis.  By report, there has been arterial sticks in the left radial artery for blood draws but he does not have a radial arterial line in place.  Although all 4 extremities are cold, the left hand and fingers are the only ones with marked cyanosis and purplish discoloration.  Current Facility-Administered Medications  Medication Dose Route Frequency Provider Last Rate Last Dose  . 0.9 %  sodium chloride infusion  250 mL Intravenous PRN Lance Coon, MD      . acetaminophen (TYLENOL) suppository 650 mg  650 mg Rectal Q4H PRN Conforti, John, DO   650 mg at 03/11/17 0916  . acyclovir (ZOVIRAX) 1,000 mg in dextrose 5 % 150 mL IVPB  1,000 mg Intravenous Q8H Conforti, John, DO      . cefTRIAXone (ROCEPHIN) 2 g in dextrose 5 % 50 mL IVPB  2 g Intravenous Q12H Conforti, John, DO      . chlorhexidine gluconate (MEDLINE KIT) (PERIDEX) 0.12 % solution 15  mL  15 mL Mouth Rinse BID Awilda Bill, NP   15 mL at 03/11/17 0858  . dexmedetomidine (PRECEDEX) 400 MCG/100ML (4 mcg/mL) infusion  0.4-1.2 mcg/kg/hr Intravenous Titrated Lance Coon, MD   Stopped at 03/11/17 1016  . dextrose 5 %-0.45 % sodium chloride infusion   Intravenous Continuous Lance Coon, MD      . famotidine (PEPCID) IVPB 20 mg premix  20 mg Intravenous Laurence Spates, MD   Stopped at 03/11/17 1007  . fentaNYL 2510mg in NS 2588m(104mml) infusion-PREMIX  0-400 mcg/hr Intravenous Continuous WilLance CoonD 5 mL/hr at 03/11/17 1304 50 mcg/hr at 03/11/17 1304  . heparin ADULT infusion 100 units/mL (25000 units/250m34mdium chloride 0.45%)  1,700 Units/hr Intravenous Continuous PateFritzi Mandes      . heparin bolus via infusion 6,000 Units  6,000 Units Intravenous Once PateFritzi Mandes      . hydrocortisone sodium succinate (SOLU-CORTEF) 100 MG injection 50 mg  50 mg Intravenous Q6H BlakAwilda Bill   50 mg at 03/11/17 1235  . insulin regular (NOVOLIN R,HUMULIN R) 100 Units in sodium chloride 0.9 % 100 mL (1 Units/mL) infusion   Intravenous Continuous WillLance Coon 20.6 mL/hr at 03/11/17 1230 20.6 Units/hr at 03/11/17 1230  . MEDLINE mouth rinse  15 mL Mouth Rinse 10 times per day BlakAwilda Bill   15 mL at 03/11/17  1237  . midazolam (VERSED) injection 2 mg  2 mg Intravenous Q15 min PRN Awilda Bill, NP      . midazolam (VERSED) injection 2 mg  2 mg Intravenous Q2H PRN Awilda Bill, NP      . norepinephrine (LEVOPHED) 16 mg in dextrose 5 % 250 mL (0.064 mg/mL) infusion  0-40 mcg/min Intravenous Continuous Awilda Bill, NP 14.1 mL/hr at 03/11/17 1121 15 mcg/min at 03/11/17 1121  . ondansetron (ZOFRAN) injection 4 mg  4 mg Intravenous Q6H PRN Lance Coon, MD      . phenylephrine (NEO-SYNEPHRINE) 10 mg in sodium chloride 0.9 % 250 mL (0.04 mg/mL) infusion  0-400 mcg/min Intravenous Titrated Conforti, John, DO 30 mL/hr at 03/11/17 1244 20 mcg/min at  03/11/17 1244  . sodium bicarbonate 150 mEq in sterile water 1,000 mL infusion   Intravenous Continuous Awilda Bill, NP 100 mL/hr at 03/11/17 1151    . sodium chloride 0.9 % bolus 1,000 mL  1,000 mL Intravenous Once Harvest Dark, MD       And  . sodium chloride 0.9 % bolus 1,000 mL  1,000 mL Intravenous Once Harvest Dark, MD       And  . sodium chloride 0.9 % bolus 1,000 mL  1,000 mL Intravenous Once Harvest Dark, MD       And  . sodium chloride 0.9 % bolus 1,000 mL  1,000 mL Intravenous Once Harvest Dark, MD       And  . sodium chloride 0.9 % bolus 500 mL  500 mL Intravenous Once Harvest Dark, MD      . vancomycin (VANCOCIN) 1,500 mg in sodium chloride 0.9 % 500 mL IVPB  1,500 mg Intravenous Q18H Awilda Bill, NP 250 mL/hr at 03/11/17 1234 1,500 mg at 03/11/17 1234  . vasopressin (PITRESSIN) 40 Units in sodium chloride 0.9 % 250 mL (0.16 Units/mL) infusion  0.03 Units/min Intravenous Continuous Awilda Bill, NP 11.3 mL/hr at 03/11/17 0150 0.03 Units/min at 03/11/17 0150    Past Medical History:  Diagnosis Date  . Back pain   . Diabetes 1.5, managed as type 1 (Adrian)   . Legg-Calve-Perthes disease   . Neuropathic pain   . Obesity   . Osteoarthritis     Past Surgical History:  Procedure Laterality Date  . LEG SURGERY      Social History Social History   Tobacco Use  . Smoking status: Never Smoker  . Smokeless tobacco: Never Used  Substance Use Topics  . Alcohol use: No  . Drug use: No    Family History Family History  Problem Relation Age of Onset  . Diabetes Mother   . Hypertension Mother   . Diabetes Father   . Hypertension Father     Allergies  Allergen Reactions  . Naproxen   . Sulfa Antibiotics Hives     REVIEW OF SYSTEMS (Negative unless checked) Unable to obtain review of systems secondary to the patient being intubated on a ventilator and in critical medical condition  Physical Examination  Vitals:    03/11/17 1119 03/11/17 1130 03/11/17 1230 03/11/17 1300  BP:  97/67 106/79 92/63  Pulse:  82 80 79  Resp:  18 18 18   Temp: (!) 101 F (38.3 C)   100.3 F (37.9 C)  TempSrc:      SpO2: 99% 96% 98% 97%  Weight:      Height:       Body mass index is 47.56 kg/m. Gen: Morbidly  obese white male laying in the ICU with twitching and some meaningful movement to pain Head: [place]/AT, No temporalis wasting.  Ear/Nose/Throat: Unable to assess hearing, nares w/o erythema or drainage, oropharynx w/o Erythema/Exudate Eyes: Sclera non-icteric, conjunctiva clear Neck: Trachea midline.  No JVD.  Pulmonary: Coarse breath sounds bilaterally, equal bilaterally.  Cardiac: Tachycardic but regular Vascular:  Vessel Right Left  Radial  trace palpable  not palpable                          PT  not palpable  not palpable  DP  not palpable  not palpable   Gastrointestinal: soft, non-tender/non-distended.  Musculoskeletal: Difficult to assess strength or tone.  Does move all 4 extremities.  His feet do not have palpable pulses and are extremely cold with slow capillary refill, but no cyanosis or skin changes are present currently.  The right hand is cool with a weakly palpable radial pulse with sluggish capillary refill.  Left hand and several fingers as well as the wrist area.  Left hand is cool.  No palpable radial pulse although a 1+ left brachial pulse does appear to be present. Neurologic: Difficult to assess as the patient is intubated and sedated but does respond to pain and seems to move all 4 extremities Psychiatric: Unable to assess secondary to patient's clinical situation. Dermatologic: No rashes or ulcers noted.  Cyanosis of the left hand as above       CBC Lab Results  Component Value Date   WBC 14.4 (H) 03/11/2017   HGB 14.7 03/11/2017   HCT 46.6 03/11/2017   MCV 86.2 03/11/2017   PLT 355 03/11/2017    BMET    Component Value Date/Time   NA 139 03/11/2017 0840   K 4.9  03/11/2017 0840   CL 104 03/11/2017 0840   CO2 20 (L) 03/11/2017 0840   GLUCOSE 628 (HH) 03/11/2017 0840   BUN 37 (H) 03/11/2017 0840   CREATININE 2.94 (H) 03/11/2017 0840   CALCIUM 7.8 (L) 03/11/2017 0840   GFRNONAA 26 (L) 03/11/2017 0840   GFRAA 30 (L) 03/11/2017 0840   Estimated Creatinine Clearance: 49.6 mL/min (A) (by C-G formula based on SCr of 2.94 mg/dL (H)).  COAG Lab Results  Component Value Date   INR 1.34 03/11/2017    Radiology Dg Ankle Complete Right  Result Date: 03/11/2017 CLINICAL DATA:  Osteomyelitis of the right ankle. EXAM: RIGHT ANKLE - COMPLETE 3+ VIEW COMPARISON:  12/07/2016 FINDINGS: Soft tissue swelling and possibly ulceration along the medial aspect of the right ankle. Periosteal thickening is identified along the distal diaphysis of the tibia and may reflect stigmata of chronic venous stasis or chronic osteomyelitis, favor venous stasis given mild-to-moderate soft tissue edema and vascular clips about the midfoot. No frank bone destruction, fracture or joint dislocation. Calcaneal enthesopathy is noted posteriorly. IMPRESSION: Periosteal new bone formation along the diaphysis of the tibia more likely to represent stigmata of chronic venous stasis. Chronic osteomyelitis could also have a similar appearance. No conclusive evidence for acute osteomyelitis or fracture. Electronically Signed   By: Ashley Royalty M.D.   On: 03/11/2017 01:32   Dg Abd 1 View  Result Date: 03/11/2017 CLINICAL DATA:  Second attempt and orogastric tube placement. EXAM: ABDOMEN - 1 VIEW COMPARISON:  03/11/2017 at 0036 hours FINDINGS: The tip and side port of a gastric tube are visualized below the left hemidiaphragm. The side-port is just slightly caudal to the expected location of the  GE junction. No free air or bowel dilatation is visualized. IMPRESSION: The tip and side port of a gastric tube are seen in the expected location of the stomach. The side port however is just slightly caudal to the  level of the GE junction. Further advancement by 3-4 cm would help improve positioning. Electronically Signed   By: Ashley Royalty M.D.   On: 03/11/2017 02:48   Dg Abd 1 View  Result Date: 03/11/2017 CLINICAL DATA:  Orogastric tube placement EXAM: ABDOMEN - 1 VIEW COMPARISON:  None. FINDINGS: The tip of the orogastric tube overlies the gastric body. The side port is just distal to the gastroesophageal junction. Advancement by 5-7 cm is recommended. Normal bowel gas pattern. IMPRESSION: Recommend advancement of the orogastric tube by 5-7 cm to ensure that the side port is within the stomach. Electronically Signed   By: Ulyses Jarred M.D.   On: 03/11/2017 01:25   Ct Head Wo Contrast  Result Date: 03/11/2017 CLINICAL DATA:  37 year old male with altered mental status. EXAM: CT HEAD WITHOUT CONTRAST TECHNIQUE: Contiguous axial images were obtained from the base of the skull through the vertex without intravenous contrast. COMPARISON:  None. FINDINGS: Brain: No evidence of acute infarction, hemorrhage, hydrocephalus, extra-axial collection or mass lesion/mass effect. Vascular: No hyperdense vessel or unexpected calcification. Skull: Normal. Negative for fracture or focal lesion. Sinuses/Orbits: There is diffuse mucoperiosteal thickening of paranasal sinuses with near complete opacification of the left maxillary sinus. The mastoid air cells are clear. Other: There is moderate left temporal subcutaneous hematoma. Correlation with clinical exam recommended. Small pockets of air in the right masticator space likely intravascular air and iatrogenic. IMPRESSION: 1. Normal noncontrast CT of the brain. 2. Moderate left temporal subcutaneous hematoma. 3. Paranasal sinus disease. Electronically Signed   By: Anner Crete M.D.   On: 03/11/2017 00:29   US Venous Img Upper Uni Left  Result Date: 03/11/2017 CLINICAL DATA:  Left upper extremity swelling.  Evaluate for DVT. EXAM: LEFT UPPER EXTREMITY VENOUS DOPPLER  ULTRASOUND TECHNIQUE: Gray-scale sonography with graded compression, as well as color Doppler and duplex ultrasound were performed to evaluate the upper extremity deep venous system from the level of the subclavian vein and including the jugular, axillary, basilic, radial, ulnar and upper cephalic vein. Spectral Doppler was utilized to evaluate flow at rest and with distal augmentation maneuvers. COMPARISON:  None. FINDINGS: Contralateral Subclavian Vein: Respiratory phasicity is normal and symmetric with the symptomatic side. No evidence of thrombus. Normal compressibility. Internal Jugular Vein: No evidence of thrombus. Normal compressibility, respiratory phasicity and response to augmentation. Subclavian Vein: There is hypoechoic occlusive thrombus involving the peripheral aspect of the left subclavian vein (image 9 and 10). The central aspect the left subclavian vein appears patent where imaged. Axillary Vein: There is hypoechoic occlusive thrombus within the left axillary vein (image 13) extending to involve one of the paired brachial veins (images 25 - 27). Cephalic Vein: No evidence of thrombus. Normal compressibility, respiratory phasicity and response to augmentation. Basilic Vein: No evidence of thrombus. Normal compressibility, respiratory phasicity and response to augmentation. Brachial Veins: There is hypoechoic occlusive thrombus within 1 of the paired left brachial veins (images 25-27. The adjacent paired brachial vein appears widely patent. Radial Veins: No evidence of thrombus. Normal compressibility, respiratory phasicity and response to augmentation. Ulnar Veins: No evidence of thrombus. Normal compressibility, respiratory phasicity and response to augmentation. Other Findings:  None visualized. IMPRESSION: The examination is positive for occlusive DVT extending from the peripheral aspect of the  left subclavian vein to involve the left axillary and one of the paired left brachial veins. These  results will be called to the ordering clinician or representative by the Radiologist Assistant, and communication documented in the PACS or zVision Dashboard. Electronically Signed   By: Sandi Mariscal M.D.   On: 03/11/2017 13:16   Dg Chest Port 1 View  Result Date: 03/11/2017 CLINICAL DATA:  Acute respiratory failure. EXAM: PORTABLE CHEST 1 VIEW COMPARISON:  Chest x-ray from yesterday. FINDINGS: Endotracheal tube remains in place with the tip approximately 2.3 cm above the level of the carina. Unchanged right internal jugular central venous catheter with the tip in the distal SVC. The cardiomediastinal silhouette is normal in size. Normal pulmonary vascularity. Low lung volumes with unchanged retrocardiac opacity at the left lung base. No pleural effusion or pneumothorax. No acute osseous abnormality. IMPRESSION: 1. Persistent low lung volumes with unchanged retrocardiac opacity, atelectasis versus infiltrate. Electronically Signed   By: Titus Dubin M.D.   On: 03/11/2017 08:34   Dg Chest Portable 1 View  Result Date: 03/26/2017 CLINICAL DATA:  Central line placement EXAM: PORTABLE CHEST 1 VIEW COMPARISON:  2013 hours FINDINGS: Low lung volumes with atelectasis at the lung bases. Retrocardiac consolidation either representing a small focus of pneumonia or atelectasis is identified at the left lung base. Endotracheal tube tip is 3.1 cm above the carina. Right IJ central line catheter is seen in the distal SVC. No pneumothorax is noted. Azygos fissure is of incidental note. No acute osseous abnormality. IMPRESSION: 1. New right IJ catheter with tip in the distal SVC. No pneumothorax. 2. Endotracheal tube in satisfactory position. 3. Low lung volumes with atelectasis. Retrocardiac opacity is noted which likely represents atelectasis. A small focus of pneumonia is not entirely excluded. Electronically Signed   By: Ashley Royalty M.D.   On: 03/14/2017 23:12   Dg Chest Portable 1 View  Result Date:  03/14/2017 CLINICAL DATA:  Intubation EXAM: PORTABLE CHEST 1 VIEW COMPARISON:  Portable exam 2013 hours compared to 01/08/2017 FINDINGS: Tip of endotracheal tube projects 4.0 cm above carina. Upper normal heart size. Azygos fissure. Mediastinal contours and pulmonary vascularity normal. Low lung volumes with mild LEFT basilar atelectasis. Additional opacity in retrocardiac LEFT lower lobe question infiltrate versus atelectasis. Upper lungs clear. No pleural effusion or pneumothorax. IMPRESSION: LEFT basilar atelectasis with atelectasis versus consolidation in retrocardiac LEFT lower lobe. Satisfactory endotracheal tube position. Electronically Signed   By: Lavonia Dana M.D.   On: 03/16/2017 20:40      Assessment/Plan 1.  Left upper extremity with signs of ischemia.  This is likely multifactorial on 3 different pressor agents with continued hypotension.  It is possible that he has an incomplete palmar arch and arterial sticks in the radial artery have microembolic disease or ischemia.  At this point, the patient is hemodynamically unstable and on maximal support and would not be a candidate for any sort of surgical or interventional therapy.  These would be unlikely to change the course of the disease anyway.  If he can tolerate anticoagulation, this can certainly be considered.  I suspect he will lose fingertips or potentially even fingers, but at this point there is very little other that can be done.  If his medical condition improves I suspect his perfusion will markedly improved as well.  Although his feet do not appear acutely threatened, they are ice cold and the pressors are creating a low flow situation to the feet as well.  Highly possible he  could end up developing lower extremity ischemia as well.  Again, no role for surgery or interventional therapy at this point given his overall situation 2.  Multisystem organ failure.  Likely a major cause of #1.  Getting aggressive therapy from the critical care  team including broad-spectrum antibiotics 3.  Hypotension requiring pressors.  Both the hypotension and the vasopressors are going to worsen extremity ischemia but that is sometimes necessary to preserve vital organs. 4.  Diabetes with poor control/DKA.  Glucose control at this point important.  He may have some underlying vascular disease given his poorly controlled diabetes although he is young for that.   Leotis Pain, MD  03/11/2017 1:41 PM    This note was created with Dragon medical transcription system.  Any error is purely unintentional

## 2017-03-11 NOTE — Progress Notes (Signed)
eLink Physician-Brief Progress Note Patient Name: Zachary Graves DOB: 10/21/1980 MRN: [id number]   Date of Service  03/11/2017  HPI/Events of Note  37 yo with encephalopathy unknown down time Multiorgan failure and septic shock resp failure and cardiovascular failure  eICU Interventions  ICU admission IV abx IVF's Insulin drip protocol Vent support Vasopressors support     Intervention Category Evaluation Type: New Patient Evaluation  Zachary Graves 03/11/2017, 12:42 AM

## 2017-03-11 NOTE — Progress Notes (Signed)
Pharmacy Electrolyte Monitoring Consult:  Pharmacy consulted to assist in monitoring and replacing electrolytes in this 37 y.o. male admitted on 04-05-17 with Loss of Consciousness Patient admitted with unresponsiveness and placed on ventilator, DKA, acute renal failure and possible sepsis.  Patient is currently on CRRT and bicarb drip.   Labs:  Sodium (mmol/L)  Date Value  03/11/2017 139   Potassium (mmol/L)  Date Value  03/11/2017 3.4 (L)   Phosphorus (mg/dL)  Date Value  16/12/9602 5.7 (H)   Calcium (mg/dL)  Date Value  54/10/8117 6.3 (LL)   Albumin (g/dL)  Date Value  14/78/2956 2.3 (L)    Plan: Will order 4 K runs IV and continue to follow renal function panels and magnesium and replace as indicated while patient is on CRRT.   Luisa Hart D 03/11/2017 4:56 PM

## 2017-03-11 NOTE — Progress Notes (Signed)
Spoke with Dr Lonn Georgia during rounds on patient. Patient is not responding to painful stimulation. Fentanyl and precedex turned off. He does have a weak gag reflex. It was also noted during my assessment that patient does have a jerking movement. Temp is down 102.5 after 2 doses of tylenol, cooling blankets and ice packs. Urine output for the past 4 hours and 30 minutes has been 50 ml total. Did add neo and able to titrate down the levophed as patients blood pressure tolerates. Still unable to obtain a dopplered pulse to the left arm. Ultrasound pending

## 2017-03-11 NOTE — Progress Notes (Addendum)
Hemodialysis Catheter Insertion Procedure Note Zachary Graves [id number] 01-30-1981  Procedure: Insertion of Central Venous Catheter Indications: CRRT  Procedure Details Consent: Risks of procedure as well as the alternatives and risks of each were explained to the (patient/caregiver).  Consent for procedure obtained. Time Out: Verified patient identification, verified procedure, site/side was marked, verified correct patient position, special equipment/implants available, medications/allergies/relevent history reviewed, required imaging and test results available.  Performed  Maximum sterile technique was used including antiseptics, cap, gloves, gown, hand hygiene, mask and sheet. Skin prep: Chlorhexidine; local anesthetic administered Right femoral blood vessel accessed under ultrasound guidance with good blood return. Guide wire advanced without difficulty. Vessel dilated but upon insertion a antimicrobial bonded/coated triple lumen trialysis catheter into the vein, there was hematoma formation with the catheter coiling back. Catheter was removed and pressure applied.  Evaluation Blood flow good Complications: Hematoma-right groin Patient did tolerate procedure well. Monitor right groin hematoma and notify provider if worse  Magdalene S. [facility] ANP-BC Pulmonary and Critical Care Medicine [facility] Pager [phone] or [phone] 03/11/2017, 3:38 PM

## 2017-03-11 NOTE — Progress Notes (Signed)
Maggie NP notified of ultrasound results.

## 2017-03-11 NOTE — Procedures (Signed)
Hemodialysis Catheter Insertion Procedure Note Dallis Phenix [id number] 1980-04-02  Procedure: Insertion of Hemodialysis Catheter Indications: Dialysis Access   Procedure Details Consent: Risks of procedure as well as the alternatives and risks of each were explained to the (patient/caregiver).  Consent for procedure obtained. Time Out: Verified patient identification, verified procedure, site/side was marked, verified correct patient position, special equipment/implants available, medications/allergies/relevent history reviewed, required imaging and test results available.  Performed  Maximum sterile technique was used including antiseptics, cap, gloves, gown, hand hygiene, mask and sheet. Skin prep: Chlorhexidine; local anesthetic administered Double lumen hemodialysis catheter was inserted into right internal jugular vein using the Seldinger technique.  Evaluation Blood flow good Complications: No apparent complications Patient did tolerate procedure well. Chest X-ray ordered to verify placement.  CXR: pending.   Right internal jugular dual lumen dialysis catheter placed utilizing ultrasound no complications noted during or following procedure.  [name], AGNP  Pulmonary/Critical Care Pager [phone] (please enter 7 digits) PCCM Consult Pager [phone] (please enter 7 digits)

## 2017-03-11 NOTE — Progress Notes (Signed)
Pulmonary/critical care  Patient's mental status has been poor even off of fentanyl and Precedex. Has had some twitching activity on occasion. We'll consult neurology, broadening antibiotic coverage to cover for C&S and consider lumbar puncture. Will order EEG.  Tora Kindred, D.O.

## 2017-03-11 NOTE — Progress Notes (Signed)
Initial Nutrition Assessment  DOCUMENTATION CODES:   Morbid obesity  INTERVENTION:  Patient is not currently hemodynamically stable enough for enteral nutrition. He also does not currently have any enteral access.  Once patient is hemodynamically stable and OGT is placed and confirmed, recommend initiating tube feeds at a trickle rate. Recommend Vital High Protein at 15 mL/hr.  If patient tolerates trickle rate, recommend slow advancement by 15 mL every 8 hours to goal rate of Vital High Protein at 60 mL/hr (1440 mL goal daily volume) + Pro-Stat 60 mL BID. Provides 1840 kcal, 186 grams of protein, 1210 mL H2O daily.  NUTRITION DIAGNOSIS:   Inadequate oral intake related to inability to eat as evidenced by NPO status.  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 Patient remained unresponsive in ER requiring mechanical intubation for airway protection. Patient found to have severe DKA, septic shock, acute liver failure with hepatic coma, and anuric renal failure requiring CRRT.   -Per chart patient has poor mental status even off sedation. Neurology following. Patient will be started on broad-spectrum antibiotic coverage as need for anticoagulation is more urgent than need for spinal tap at this time. -Patient is not moving left upper extremity and does not have a pulse in that extremity. Concern for ischemic limb. -Per Nephrology note patient will be starting CVVHD today with UF of 0 mL/hr.  No family members present at time of RD assessment. Noted left-sided bruising. Limited weight history in chart. Patient appears  weight-stable from 12/2016.  Patient does not have enteral access at this time. Plan is to attempt placement of OGT later today.  MAP: 57-89 mmHg; however patient is on >5 micrograms/min of both norepinephrine and phenylephrine, lactic acid was 5 this AM, has developed anuric renal failure, and is not hemodynamically stable at this time  Patient is currently intubated on ventilator support MV: 12 L/min Temp (24hrs), Avg:100.9 F (38.3 C), Min:100 F (37.8 C), Max:103.5 F (39.7 C)  Propofol: N/A  Medications reviewed and include: Solu-Cortef 50 mg Q6hrs IV, acyclovir, ceftriaxone, Precedex gtt, D5-1/2NS at 125 mL/hr (150 grams dextrose, 510 kcal daily), famotidine, fentanyl gtt, heparin gtt, regular insulin gtt, norepinephrine gtt (15 micrograms/min), phenylephrine gtt (20 micrograms/min), sodium bicarbonate 150 mEq in sterile water at 100 mL/hr, vancomycin, vasopressin gtt at 0.3 units/min.  Labs reviewed: CBG 560->600, CO2 20, BUN 39, Creatinine 2.96, Lactic Acid 5, AST >10000, ALT 3543.  Patient does not meet criteria for malnutrition.   Discussed with RN and discussed on rounds.  NUTRITION - FOCUSED PHYSICAL EXAM:    Most Recent Value  Orbital Region  No depletion  Upper Arm Region  No depletion  Thoracic and Lumbar Region  No depletion  Buccal Region  No depletion  Temple Region  No depletion  Clavicle Bone Region  No depletion  Clavicle and Acromion Bone Region  No depletion  Scapular Bone Region  Unable to assess  Dorsal Hand  No depletion  Patellar Region  No depletion  Anterior Thigh Region  No depletion  Posterior Calf Region  No depletion  Edema (RD Assessment)  Moderate  Hair  Reviewed  Eyes  Unable to assess  Mouth  Unable to assess  Skin  Reviewed  Nails  Reviewed     Diet Order:  Diet NPO time specified  EDUCATION NEEDS:   No education needs have been identified at this time  Skin:  Skin Assessment: Skin Integrity Issues: Skin Integrity Issues::  Stage III, Other (Comment) Stage III: right ankle Other: serous blister left arm; cracking to bilateral feet; ecchymosis to left head, arm, and leg  Last BM:  Unknown  Height:   Ht Readings from Last 1 Encounters:  03/11/17 5\' 9"  (1.753 m)    Weight:   Wt Readings from Last 1 Encounters:  03/11/17 (!) 322 lb 1.5 oz (146.1 kg)    Ideal Body Weight:  72.7 kg  BMI:  Body mass index is 47.56 kg/m.  Estimated Nutritional Needs:   Kcal:  5784-6962 (11-14 kcal/kg)  Protein:  >/= 182 grams (>/= 2.5 grams/kg IBW)  Fluid:  2.2-2.5 L/day (30-35 mL/kg IBW)  Helane Rima, MS, RD, LDN Office: [phone] Pager: [phone] After Hours/Weekend Pager: [phone]

## 2017-03-11 NOTE — Procedures (Signed)
Central Venous Catheter Insertion Procedure Note Zachary Graves [id number] 01-Dec-1980  Procedure: Insertion of Central Venous Catheter Indications: Assessment of intravascular volume, Drug and/or fluid administration and Frequent blood sampling  Procedure Details Consent: Risks of procedure as well as the alternatives and risks of each were explained to the (patient/caregiver).  Consent for procedure obtained. Time Out: Verified patient identification, verified procedure, site/side was marked, verified correct patient position, special equipment/implants available, medications/allergies/relevent history reviewed, required imaging and test results available.  Performed  Maximum sterile technique was used including antiseptics, cap, gloves, gown, hand hygiene, mask and sheet. Skin prep: Chlorhexidine; local anesthetic administered A antimicrobial bonded/coated triple lumen catheter was placed in the left femoral vein due to multiple attempts, no other available access using the Seldinger technique.  Evaluation Blood flow good Complications: No apparent complications Patient did tolerate procedure well. Chest X-ray ordered to verify placement.  CXR: normal.  Left femoral central line placed utilizing ultrasound no complications noted during or following procedure.  Zachary Graves, AGNP  Pulmonary/Critical Care Pager [phone] (please enter 7 digits) PCCM Consult Pager [phone] (please enter 7 digits)

## 2017-03-11 NOTE — Progress Notes (Signed)
ANTICOAGULATION CONSULT NOTE  Pharmacy Consult for Heparin Indication: upper extremity DVT  Allergies  Allergen Reactions  . Naproxen   . Sulfa Antibiotics Hives    Patient Measurements: Height: 5\' 9"  (175.3 cm) Weight: (!) 322 lb 1.5 oz (146.1 kg) IBW/kg (Calculated) : 70.7 Heparin Dosing Weight: 105 kg  Vital Signs: Temp: 98.5 F (36.9 C) (01/10 1630) BP: 68/57 (01/10 1630) Pulse Rate: 80 (01/10 1630)  Labs: Recent Labs    04-04-2017 2012 03/11/17 0113 03/11/17 0428 03/11/17 0840 03/11/17 1115 03/11/17 1116 03/11/17 1241 03/11/17 1543  HGB 15.5  --  14.7  --   --   --   --   --   HCT 54.0*  --  46.6  --   --   --   --   --   PLT 379  --  355  --   --   --   --   --   APTT  --   --   --   --  27  --   --   --   LABPROT  --   --   --   --   --  16.5*  --   --   INR  --   --   --   --   --  1.34  --   --   CREATININE 3.09* 2.81* 2.69* 2.94*  --   --  2.96* 2.75*  CKTOTAL 38,049*  --   --   --   --   --   --   --   TROPONINI  --  0.67*  --  1.46*  --   --  1.35*  --     Estimated Creatinine Clearance: 53 mL/min (A) (by C-G formula based on SCr of 2.75 mg/dL (H)).   Medical History: Past Medical History:  Diagnosis Date  . Back pain   . Diabetes 1.5, managed as type 1 (HCC)   . Legg-Calve-Perthes disease   . Neuropathic pain   . Obesity   . Osteoarthritis     Assessment: 37 y/o M admitted with unresponsiveness and ventilated with DKA and found to have upper extremity DVT.   Goal of Therapy:  Heparin level 0.3-0.7 units/ml Monitor platelets by anticoagulation protocol: Yes   Plan:  Give 6000units bolus x 1 Start heparin infusion at 1700 units/hr Check anti-Xa level in 6 hours and daily while on heparin Continue to monitor H&H and platelets  Luisa Hart D 03/11/2017,5:03 PM

## 2017-03-11 NOTE — Progress Notes (Signed)
CODE SEPSIS - PHARMACY COMMUNICATION  **Broad Spectrum Antibiotics should be administered within 1 hour of Sepsis diagnosis**  Time Code Sepsis Called/Page Received: 01/09 2314  Antibiotics Ordered: Zosyn 01/09 2311  Time of 1st antibiotic administration: 01/09 2348  Additional action taken by pharmacy: n/a  If necessary, Name of Provider/Nurse Contacted: n/a    Erich Montane ,PharmD Clinical Pharmacist  03/11/2017  12:59 AM

## 2017-03-11 NOTE — Consult Note (Signed)
Reason for Consult:AMS Referring Physician: Conforti  CC: AMS, fever  HPI: Zachary Graves is an 37 y.o. male who is unable to provide any history due to intubation and mental status.  All history obtained from family and from the chart.  Patient was last known well early in the morning on yesterday when his mother left for work, though he had been vomiting and feeling bad the night before.  When she returned home from work in the afternoon she found him unresponsive on the bathroom floor.  In the ED he required intubation as he was very lethargic and on blood gases found to be hypercarbic.  He was in severe DKA on laboratory evaluation, and also has significantly elevated transaminases and acute kidney injury.    Patient felt to be septic due to high fevers and hypotension.  No source of sepsis noted.  Consult called for recommendations and possible spinal tap.   Past Medical History:  Diagnosis Date  . Back pain   . Diabetes 1.5, managed as type 1 ([place])   . Legg-Calve-Perthes disease   . Neuropathic pain   . Obesity   . Osteoarthritis     Past Surgical History:  Procedure Laterality Date  . LEG SURGERY      Family History  Problem Relation Age of Onset  . Diabetes Mother   . Hypertension Mother   . Diabetes Father   . Hypertension Father     Social History:  reports that  has never smoked. he has never used smokeless tobacco. He reports that he does not drink alcohol or use drugs.  Allergies  Allergen Reactions  . Naproxen   . Sulfa Antibiotics Hives    Medications:  I have reviewed the patient's current medications. Prior to Admission:  Medications Prior to Admission  Medication Sig Dispense Refill Last Dose  . doxycycline (VIBRAMYCIN) 100 MG capsule Take 100 mg by mouth 2 (two) times daily. For 14 days   Unknown at Unknown  . DULoxetine (CYMBALTA) 30 MG capsule Take 30 mg by mouth daily.  2 Unknown at Unknown  . gabapentin (NEURONTIN) 800 MG tablet Take 800 mg by mouth  4 (four) times daily.  5 Unknown at Unknown  . ibuprofen (ADVIL,MOTRIN) 800 MG tablet Take 800 mg by mouth every 8 (eight) hours as needed. For pain   prn at prn  . ondansetron (ZOFRAN-ODT) 4 MG disintegrating tablet Take 1 tablet (4 mg total) every 8 (eight) hours as needed by mouth for nausea or vomiting. 8 tablet 0 prn at prn  . Oxycodone HCl 20 MG TABS Take 1 tablet by mouth every 6 (six) hours as needed.  0 prn at prn  . OXYCONTIN 60 MG 12 hr tablet Take 60 mg by mouth every 12 (twelve) hours.  0 Unknown at Unknown  . ranitidine (ZANTAC) 75 MG tablet Take 75 mg by mouth daily as needed for heartburn.   prn at prn  . tiZANidine (ZANAFLEX) 4 MG tablet Take 4 mg by mouth 4 (four) times daily.   Unknown at Unknown  . ATENOLOL PO Take by mouth.   Not Taking at Unknown time  . Phenylephrine-DM-GG-APAP (TYLENOL COLD/FLU SEVERE) 5-10-200-325 MG TABS Take 2 tablets by mouth every 4 (four) hours as needed. For fever and nausea   Not Taking at Unknown time   Scheduled: . chlorhexidine gluconate (MEDLINE KIT)  15 mL Mouth Rinse BID  . heparin injection (subcutaneous)  5,000 Units Subcutaneous Q8H  . hydrocortisone sod succinate (SOLU-CORTEF) inj  50 mg Intravenous Q6H  . mouth rinse  15 mL Mouth Rinse 10 times per day    ROS: Unable to provide due to mental status  Physical Examination: Blood pressure 121/71, pulse 88, temperature (!) 102.3 F (39.1 C), resp. rate 18, height 5' 9"  (1.753 m), weight (!) 146.1 kg (322 lb 1.5 oz), SpO2 99 %.  HEENT-  Normocephalic, hematoma on left side of head and left external ear.  Normal external eye and conjunctiva.  Normal TM's bilaterally.  Normal external nose, mucus membranes and septum.  Normal pharynx. Cardiovascular- S1, S2 normal, pulses not palpable Lungs- chest clear, no wheezing, rales, normal symmetric air entry Abdomen- soft, non-tender; bowel sounds normal; no masses,  no organomegaly Extremities- LE edema with sores on the right foot that appear  to be healing Lymph-no adenopathy palpable Musculoskeletal-no joint tenderness, deformity or swelling Skin-sores on right foot, left arm with mottling  Neurological Examination   Mental Status: Patient does not respond to verbal stimuli.  With deep sternal rub localizes to pain with LUE.  Does not follow commands.  No verbalizations noted.  Cranial Nerves: II: patient does not respond confrontation bilaterally, pupils right 3 mm, left 3 mm,and reactive bilaterally III,IV,VI: doll's response present bilaterally.  V,VII: corneal reflex reduced bilaterally  VIII: patient does not respond to verbal stimuli IX,X: gag reflex reduced, XI: trapezius strength unable to test bilaterally XII: tongue strength unable to test Motor: Purposely moves RUE and localizes to pain.  No movement noted of the LUE.  Some withdrawal noted in both lower extremities.  Occasional multifocal myoclonus noted.   Sensory: Responds to noxious stimuli in the RUE and both lower extremities. Deep Tendon Reflexes:  1+ in the upper extremities and absent in the lower extremities Plantars: Mute bilaterally Cerebellar: Unable to perform  Laboratory Studies:   Basic Metabolic Panel: Recent Labs  Lab 03/02/2017 2012 03/11/17 0113 03/11/17 0428 03/11/17 0840  NA 122* 137 140 139  K >7.5* 4.2 4.7 4.9  CL 84* 101 105 104  CO2 18* 19* 19* 20*  GLUCOSE 1,184* 933* 702* 628*  BUN 35* 35* 33* 37*  CREATININE 3.09* 2.81* 2.69* 2.94*  CALCIUM 8.5* 8.1* 7.7* 7.8*    Liver Function Tests: Recent Labs  Lab 03/06/2017 2012 03/11/17 0428  AST 4,855* >10,000*  ALT 2,250* 3,543*  ALKPHOS 279*  --   BILITOT 1.2  --   PROT 8.3*  --   ALBUMIN 3.6  --    No results for input(s): LIPASE, AMYLASE in the last 168 hours. No results for input(s): AMMONIA in the last 168 hours.  CBC: Recent Labs  Lab 03/12/2017 2012 03/11/17 0428  WBC 13.4* 14.4*  NEUTROABS 11.5*  --   HGB 15.5 14.7  HCT 54.0* 46.6  MCV 94.1 86.2  PLT  379 355    Cardiac Enzymes: Recent Labs  Lab 03/31/2017 2012 03/11/17 0113 03/11/17 0840  CKTOTAL 38,049*  --   --   TROPONINI  --  0.67* 1.46*    BNP: Invalid input(s): POCBNP  CBG: Recent Labs  Lab 03/11/17 0805 03/11/17 0905 03/11/17 1012 03/11/17 1118 03/11/17 1229  GLUCAP >600* >600* 599* 560* 574*    Microbiology: Results for orders placed or performed during the hospital encounter of 03/06/2017  Blood Culture (routine x 2)     Status: None (Preliminary result)   Collection Time: 03/22/2017 11:28 PM  Result Value Ref Range Status   Specimen Description BLOOD LT [facility]  Final   Special Requests  Final    BOTTLES DRAWN AEROBIC AND ANAEROBIC Blood Culture adequate volume   Culture   Final    NO GROWTH < 12 HOURS Performed at [facility], [address] [place], [place] [address]    Report Status PENDING  Incomplete  Blood Culture (routine x 2)     Status: None (Preliminary result)   Collection Time: 03/25/2017 11:28 PM  Result Value Ref Range Status   Specimen Description BLOOD RT [facility]  Final   Special Requests   Final    BOTTLES DRAWN AEROBIC AND ANAEROBIC Blood Culture adequate volume   Culture   Final    NO GROWTH < 12 HOURS Performed at [facility], [address], [place], [place] [address]    Report Status PENDING  Incomplete  MRSA PCR Screening     Status: Abnormal   Collection Time: 03/11/17 12:20 AM  Result Value Ref Range Status   MRSA by PCR POSITIVE (A) NEGATIVE Final    Comment:        The GeneXpert MRSA Assay (FDA approved for NASAL specimens only), is one component of a comprehensive MRSA colonization surveillance program. It is not intended to diagnose MRSA infection nor to guide or monitor treatment for MRSA infections. RESULT CALLED TO, READ BACK BY AND VERIFIED WITH: BARBARA THAO ON 03/11/17 AT 0310 JAG Performed at [facility], [address] [place],  [address]     Coagulation Studies: Recent  Labs    03/11/17 1116  LABPROT 16.5*  INR 1.34    Urinalysis:  Recent Labs  Lab 03/16/2017 2012  COLORURINE YELLOW*  LABSPEC 1.024  PHURINE 6.0  GLUCOSEU >=500*  HGBUR LARGE*  BILIRUBINUR NEGATIVE  KETONESUR NEGATIVE  PROTEINUR 30*  NITRITE NEGATIVE  LEUKOCYTESUR NEGATIVE    Lipid Panel:  No results found for: CHOL, TRIG, HDL, CHOLHDL, VLDL, LDLCALC  HgbA1C: No results found for: HGBA1C  Urine Drug Screen:      Component Value Date/Time   LABOPIA POSITIVE (A) 03/08/2017 2012   COCAINSCRNUR NONE DETECTED 03/03/2017 2012   LABBENZ NONE DETECTED 03/17/2017 2012   AMPHETMU NONE DETECTED 03/26/2017 2012   THCU NONE DETECTED 03/20/2017 2012   LABBARB NONE DETECTED 03/15/2017 2012    Alcohol Level: No results for input(s): ETH in the last 168 hours.  Other results: EKG: normal sinus rhythm at 96 bpm.  Imaging: Dg Ankle Complete Right  Result Date: 03/11/2017 CLINICAL DATA:  Osteomyelitis of the right ankle. EXAM: RIGHT ANKLE - COMPLETE 3+ VIEW COMPARISON:  12/07/2016 FINDINGS: Soft tissue swelling and possibly ulceration along the medial aspect of the right ankle. Periosteal thickening is identified along the distal diaphysis of the tibia and may reflect stigmata of chronic venous stasis or chronic osteomyelitis, favor venous stasis given mild-to-moderate soft tissue edema and vascular clips about the midfoot. No frank bone destruction, fracture or joint dislocation. Calcaneal enthesopathy is noted posteriorly. IMPRESSION: Periosteal new bone formation along the diaphysis of the tibia more likely to represent stigmata of chronic venous stasis. Chronic osteomyelitis could also have a similar appearance. No conclusive evidence for acute osteomyelitis or fracture. Electronically Signed   By: Ashley Royalty M.D.   On: 03/11/2017 01:32   Dg Abd 1 View  Result Date: 03/11/2017 CLINICAL DATA:  Second attempt and orogastric tube placement. EXAM: ABDOMEN - 1 VIEW COMPARISON:  03/11/2017  at 0036 hours FINDINGS: The tip and side port of a gastric tube are visualized below the left hemidiaphragm. The side-port is just  slightly caudal to the expected location of the GE junction. No free air or bowel dilatation is visualized. IMPRESSION: The tip and side port of a gastric tube are seen in the expected location of the stomach. The side port however is just slightly caudal to the level of the GE junction. Further advancement by 3-4 cm would help improve positioning. Electronically Signed   By: Ashley Royalty M.D.   On: 03/11/2017 02:48   Dg Abd 1 View  Result Date: 03/11/2017 CLINICAL DATA:  Orogastric tube placement EXAM: ABDOMEN - 1 VIEW COMPARISON:  None. FINDINGS: The tip of the orogastric tube overlies the gastric body. The side port is just distal to the gastroesophageal junction. Advancement by 5-7 cm is recommended. Normal bowel gas pattern. IMPRESSION: Recommend advancement of the orogastric tube by 5-7 cm to ensure that the side port is within the stomach. Electronically Signed   By: Ulyses Jarred M.D.   On: 03/11/2017 01:25   Ct Head Wo Contrast  Result Date: 03/11/2017 CLINICAL DATA:  37 year old male with altered mental status. EXAM: CT HEAD WITHOUT CONTRAST TECHNIQUE: Contiguous axial images were obtained from the base of the skull through the vertex without intravenous contrast. COMPARISON:  None. FINDINGS: Brain: No evidence of acute infarction, hemorrhage, hydrocephalus, extra-axial collection or mass lesion/mass effect. Vascular: No hyperdense vessel or unexpected calcification. Skull: Normal. Negative for fracture or focal lesion. Sinuses/Orbits: There is diffuse mucoperiosteal thickening of paranasal sinuses with near complete opacification of the left maxillary sinus. The mastoid air cells are clear. Other: There is moderate left temporal subcutaneous hematoma. Correlation with clinical exam recommended. Small pockets of air in the right masticator space likely intravascular air  and iatrogenic. IMPRESSION: 1. Normal noncontrast CT of the brain. 2. Moderate left temporal subcutaneous hematoma. 3. Paranasal sinus disease. Electronically Signed   By: Anner Crete M.D.   On: 03/11/2017 00:29   Dg Chest Port 1 View  Result Date: 03/11/2017 CLINICAL DATA:  Acute respiratory failure. EXAM: PORTABLE CHEST 1 VIEW COMPARISON:  Chest x-ray from yesterday. FINDINGS: Endotracheal tube remains in place with the tip approximately 2.3 cm above the level of the carina. Unchanged right internal jugular central venous catheter with the tip in the distal SVC. The cardiomediastinal silhouette is normal in size. Normal pulmonary vascularity. Low lung volumes with unchanged retrocardiac opacity at the left lung base. No pleural effusion or pneumothorax. No acute osseous abnormality. IMPRESSION: 1. Persistent low lung volumes with unchanged retrocardiac opacity, atelectasis versus infiltrate. Electronically Signed   By: Titus Dubin M.D.   On: 03/11/2017 08:34   Dg Chest Portable 1 View  Result Date: 03/18/2017 CLINICAL DATA:  Central line placement EXAM: PORTABLE CHEST 1 VIEW COMPARISON:  2013 hours FINDINGS: Low lung volumes with atelectasis at the lung bases. Retrocardiac consolidation either representing a small focus of pneumonia or atelectasis is identified at the left lung base. Endotracheal tube tip is 3.1 cm above the carina. Right IJ central line catheter is seen in the distal SVC. No pneumothorax is noted. Azygos fissure is of incidental note. No acute osseous abnormality. IMPRESSION: 1. New right IJ catheter with tip in the distal SVC. No pneumothorax. 2. Endotracheal tube in satisfactory position. 3. Low lung volumes with atelectasis. Retrocardiac opacity is noted which likely represents atelectasis. A small focus of pneumonia is not entirely excluded. Electronically Signed   By: Ashley Royalty M.D.   On: 03/06/2017 23:12   Dg Chest Portable 1 View  Result Date: 03/06/2017 CLINICAL DATA:  Intubation EXAM: PORTABLE CHEST 1 VIEW COMPARISON:  Portable exam 2013 hours compared to 01/08/2017 FINDINGS: Tip of endotracheal tube projects 4.0 cm above carina. Upper normal heart size. Azygos fissure. Mediastinal contours and pulmonary vascularity normal. Low lung volumes with mild LEFT basilar atelectasis. Additional opacity in retrocardiac LEFT lower lobe question infiltrate versus atelectasis. Upper lungs clear. No pleural effusion or pneumothorax. IMPRESSION: LEFT basilar atelectasis with atelectasis versus consolidation in retrocardiac LEFT lower lobe. Satisfactory endotracheal tube position. Electronically Signed   By: Lavonia Dana M.D.   On: 03/15/2017 20:40     Assessment/Plan: 37 year old male with DKA, fever and altered mental status.  Has multiple metabolic abnormalities which are likely contributing to the patient's mental status and his myoclonic activity as well.  The patient has been febrile though and is hypotensive.  Cannot rule out the possibility of a CNS infection.  In addition patient also without a pulse in the left upper extremity and is not moving the left upper extremity.  There is concern for an ischemic limb.  In an attempt to save the limb it is necessary for the patient to be on anticoagulation.  The need for anticoagulation at this point seems more urgent than the need for spinal tap.  Due to the fact that CNS infection remains in the differential broad-spectrum antibiotic coverage is indicated.   Recommendations: 1.  Broad-spectrum antibiotic coverage to include acyclovir. 2.  EEG to further investigate myoclonic activity 3.  Agree with continued addressing of metabolic issues 4.  Will continue to follow with you  Case discussed with Dr. Jefferson Fuel and family   Alexis Goodell, MD Neurology [phone] 03/11/2017, 12:52 PM

## 2017-03-11 NOTE — Procedures (Signed)
Hemodialysis Catheter Insertion Procedure Note Jarrius Schoff [id number] [birth year]  Procedure: Insertion of Central Venous Catheter Indications: Continuous renal replacement therapy/Hemodialysis  Procedure Details Consent: Risks of procedure as well as the alternatives and risks of each were explained to the (patient/caregiver).  Consent for procedure obtained. Time Out: Verified patient identification, verified procedure, site/side was marked, verified correct patient position, special equipment/implants available, medications/allergies/relevent history reviewed, required imaging and test results available.  Performed  Maximum sterile technique was used including antiseptics, cap, gloves, gown, hand hygiene, mask and sheet. Skin prep: Chlorhexidine; local anesthetic administered A antimicrobial bonded/coated double lumen catheter was placed in the left femoral vein due to patient being a dialysis patient, massive left subclavian clot  and emergent situation using the Seldinger technique.  Evaluation Blood flow good Complications: No apparent complications Patient did tolerate procedure well. Chest X-ray ordered to verify placement.  CXR: not indicated in the femoral location.  Procedure performed under direct supervision of Dr.Simonds. Ultrasound utilized for realtime vessel cannulation  Vestal Markin S. [facility] ANP-BC Pulmonary and Critical Care Medicine [facility] Pager [phone] or [phone]  03/11/2017, 3:12 PM

## 2017-03-11 NOTE — Progress Notes (Signed)
NP Maggie notified of patients mental status - when instructed patient able to move extremities EXCEPT for left arm. Maggie NP is aware. Orders to restart fentanyl drip for patient comfort and to place dialysis catheter for possible dialysis.

## 2017-03-11 NOTE — Progress Notes (Signed)
Transported pt to CT and to ICU 16 on vent without incident. Report given to ICU RT.

## 2017-03-11 NOTE — Progress Notes (Signed)
Dr. Lonn Georgia at patients bedside. Spoke with MD regarding patients left arm. Mottled, swollen, no pulses found when dopplered. Md at bedside. Temp 103.5. Cooling blanket, ice pack. He will place orders.

## 2017-03-11 NOTE — Progress Notes (Signed)
Pharmacy Antibiotic Note  Zachary Graves is a 37 y.o. male admitted on 03/24/2017 with sepsis.  Pharmacy has been consulted for vancomycin and cefepime dosing.  Plan: DW 107kg  Vd 75L kei 0.046 hr-1  T1/2 15 hours Vancomycin 1500 mg q 18 hours ordered with stacked dosing. Level before 3rd dose due to changing renal function. Goal trough 15-20.  Cefepime 2 grams q 12 hours ordered.  Height: 6\' 2"  (188 cm) Weight: (!) 320 lb (145.2 kg) IBW/kg (Calculated) : 82.2  Temp (24hrs), Avg:100.4 F (38 C), Min:100 F (37.8 C), Max:100.8 F (38.2 C)  Recent Labs  Lab 03/12/2017 2012 03/15/2017 2327 03/11/17 0113  WBC 13.4*  --   --   CREATININE 3.09*  --  2.81*  LATICACIDVEN  --  4.2* 4.8*    Estimated Creatinine Clearance: 55.2 mL/min (A) (by C-G formula based on SCr of 2.81 mg/dL (H)).    Allergies  Allergen Reactions  . Naproxen   . Sulfa Antibiotics Hives    Antimicrobials this admission: Zosyn x1 1/10; vancomycin, cefepime 1/10  >>    >>   Dose adjustments this admission:   Microbiology results: 1/9 BCx: pending 1/9 UCx: pending  1/9 Sputum: pending  1/10 Wound cx: pending 1/10 MRSA PCR: (+)  Thank you for allowing pharmacy to be a part of this patient's care.  Sigifredo Pignato S 03/11/2017 3:20 AM

## 2017-03-11 NOTE — Progress Notes (Signed)
Unable to check glucose due to [facility] NP in room, process of placing dialysis-catheter. Maggie also notified of patients troponin level. At this time no additional orders. Will start heparin drip once Seward Grater has completed procedure.

## 2017-03-11 NOTE — Progress Notes (Signed)
[facility] [place] [facility]   PATIENT NAME: Zachary Graves    MR#:  [id number]  DATE OF BIRTH:  1980-12-14  SUBJECTIVE:  Patient currently on levo fed and Neo-Synephrine drip Very restless.  Patient now intubated and on the ventilator  REVIEW OF SYSTEMS:   Review of Systems  Unable to perform ROS: Intubated   Tolerating Diet:npo Tolerating PT:  Intubated DRUG ALLERGIES:   Allergies  Allergen Reactions  . Naproxen   . Sulfa Antibiotics Hives    VITALS:  Blood pressure 92/63, pulse 79, temperature 100.3 F (37.9 C), resp. rate 18, height 5\' 9"  (1.753 m), weight (!) 146.1 kg (322 lb 1.5 oz), SpO2 97 %.  PHYSICAL EXAMINATION:   Physical Exam  GENERAL:  37 y.o.-year-old patient lying in the bed with no acute distress.  Morbidly obese critically ill eYES: Pupils equal, round, reactive to light and accommodation. No scleral icterus. .  Intubated HEENT: Head atraumatic, normocephalic. Oropharynx and nasopharynx clear.  NECK:  Supple, no jugular venous distention. No thyroid enlargement, no tenderness.  LUNGS: Normal breath sounds bilaterally, no wheezing, rales, rhonchi. No use of accessory muscles of respiration.  CARDIOVASCULAR: S1, S2 normal. No murmurs, rubs, or gallops.  ABDOMEN: Soft, nontender, nondistended. Bowel sounds present. No organomegaly or mass.  EXTREMITIES: Cold and cyanotic digits left upper extremity cold - both lower extremity cold nEUROLOGIC: Cranial nerves II through XII are intact. No focal Motor or sensory deficits b/l.   PSYCHIATRIC: Patient is intubated and on the ventilator SKIN: No obvious rash, lesion, or ulcer.   LABORATORY PANEL:  CBC Recent Labs  Lab 03/11/17 0428  WBC 14.4*  HGB 14.7  HCT 46.6  PLT 355    Chemistries  Recent Labs  Lab 2017-03-14 2012  03/11/17 0428  03/11/17 1241  NA 122*   < > 140   < > 138  K >7.5*   < > 4.7   < > 4.4  CL 84*   < > 105   < > 103  CO2 18*   < > 19*   < > 20*   GLUCOSE 1,184*   < > 702*   < > 585*  BUN 35*   < > 33*   < > 39*  CREATININE 3.09*   < > 2.69*   < > 2.96*  CALCIUM 8.5*   < > 7.7*   < > 7.5*  AST 4,855*  --  >10,000*  --   --   ALT 2,250*  --  3,543*  --   --   ALKPHOS 279*  --   --   --   --   BILITOT 1.2  --   --   --   --    < > = values in this interval not displayed.   Cardiac Enzymes Recent Labs  Lab 03/11/17 1241  TROPONINI 1.35*   RADIOLOGY:  Dg Ankle Complete Right  Result Date: 03/11/2017 CLINICAL DATA:  Osteomyelitis of the right ankle. EXAM: RIGHT ANKLE - COMPLETE 3+ VIEW COMPARISON:  12/07/2016 FINDINGS: Soft tissue swelling and possibly ulceration along the medial aspect of the right ankle. Periosteal thickening is identified along the distal diaphysis of the tibia and may reflect stigmata of chronic venous stasis or chronic osteomyelitis, favor venous stasis given mild-to-moderate soft tissue edema and vascular clips about the midfoot. No frank bone destruction, fracture or joint dislocation. Calcaneal enthesopathy is noted posteriorly. IMPRESSION: Periosteal new bone formation along the diaphysis of the  tibia more likely to represent stigmata of chronic venous stasis. Chronic osteomyelitis could also have a similar appearance. No conclusive evidence for acute osteomyelitis or fracture. Electronically Signed   By: Tollie Eth M.D.   On: 03/11/2017 01:32   Dg Abd 1 View  Result Date: 03/11/2017 CLINICAL DATA:  Second attempt and orogastric tube placement. EXAM: ABDOMEN - 1 VIEW COMPARISON:  03/11/2017 at 0036 hours FINDINGS: The tip and side port of a gastric tube are visualized below the left hemidiaphragm. The side-port is just slightly caudal to the expected location of the GE junction. No free air or bowel dilatation is visualized. IMPRESSION: The tip and side port of a gastric tube are seen in the expected location of the stomach. The side port however is just slightly caudal to the level of the GE junction. Further  advancement by 3-4 cm would help improve positioning. Electronically Signed   By: Tollie Eth M.D.   On: 03/11/2017 02:48   Dg Abd 1 View  Result Date: 03/11/2017 CLINICAL DATA:  Orogastric tube placement EXAM: ABDOMEN - 1 VIEW COMPARISON:  None. FINDINGS: The tip of the orogastric tube overlies the gastric body. The side port is just distal to the gastroesophageal junction. Advancement by 5-7 cm is recommended. Normal bowel gas pattern. IMPRESSION: Recommend advancement of the orogastric tube by 5-7 cm to ensure that the side port is within the stomach. Electronically Signed   By: Deatra Robinson M.D.   On: 03/11/2017 01:25   Ct Head Wo Contrast  Result Date: 03/11/2017 CLINICAL DATA:  37 year old male with altered mental status. EXAM: CT HEAD WITHOUT CONTRAST TECHNIQUE: Contiguous axial images were obtained from the base of the skull through the vertex without intravenous contrast. COMPARISON:  None. FINDINGS: Brain: No evidence of acute infarction, hemorrhage, hydrocephalus, extra-axial collection or mass lesion/mass effect. Vascular: No hyperdense vessel or unexpected calcification. Skull: Normal. Negative for fracture or focal lesion. Sinuses/Orbits: There is diffuse mucoperiosteal thickening of paranasal sinuses with near complete opacification of the left maxillary sinus. The mastoid air cells are clear. Other: There is moderate left temporal subcutaneous hematoma. Correlation with clinical exam recommended. Small pockets of air in the right masticator space likely intravascular air and iatrogenic. IMPRESSION: 1. Normal noncontrast CT of the brain. 2. Moderate left temporal subcutaneous hematoma. 3. Paranasal sinus disease. Electronically Signed   By: Elgie Collard M.D.   On: 03/11/2017 00:29   US Venous Img Upper Uni Left  Result Date: 03/11/2017 CLINICAL DATA:  Left upper extremity swelling.  Evaluate for DVT. EXAM: LEFT UPPER EXTREMITY VENOUS DOPPLER ULTRASOUND TECHNIQUE: Gray-scale sonography  with graded compression, as well as color Doppler and duplex ultrasound were performed to evaluate the upper extremity deep venous system from the level of the subclavian vein and including the jugular, axillary, basilic, radial, ulnar and upper cephalic vein. Spectral Doppler was utilized to evaluate flow at rest and with distal augmentation maneuvers. COMPARISON:  None. FINDINGS: Contralateral Subclavian Vein: Respiratory phasicity is normal and symmetric with the symptomatic side. No evidence of thrombus. Normal compressibility. Internal Jugular Vein: No evidence of thrombus. Normal compressibility, respiratory phasicity and response to augmentation. Subclavian Vein: There is hypoechoic occlusive thrombus involving the peripheral aspect of the left subclavian vein (image 9 and 10). The central aspect the left subclavian vein appears patent where imaged. Axillary Vein: There is hypoechoic occlusive thrombus within the left axillary vein (image 13) extending to involve one of the paired brachial veins (images 25 - 27). Cephalic Vein: No  evidence of thrombus. Normal compressibility, respiratory phasicity and response to augmentation. Basilic Vein: No evidence of thrombus. Normal compressibility, respiratory phasicity and response to augmentation. Brachial Veins: There is hypoechoic occlusive thrombus within 1 of the paired left brachial veins (images 25-27. The adjacent paired brachial vein appears widely patent. Radial Veins: No evidence of thrombus. Normal compressibility, respiratory phasicity and response to augmentation. Ulnar Veins: No evidence of thrombus. Normal compressibility, respiratory phasicity and response to augmentation. Other Findings:  None visualized. IMPRESSION: The examination is positive for occlusive DVT extending from the peripheral aspect of the left subclavian vein to involve the left axillary and one of the paired left brachial veins. These results will be called to the ordering clinician  or representative by the Radiologist Assistant, and communication documented in the PACS or zVision Dashboard. Electronically Signed   By: Simonne Come M.D.   On: 03/11/2017 13:16   Dg Chest Port 1 View  Result Date: 03/11/2017 CLINICAL DATA:  Acute respiratory failure. EXAM: PORTABLE CHEST 1 VIEW COMPARISON:  Chest x-ray from yesterday. FINDINGS: Endotracheal tube remains in place with the tip approximately 2.3 cm above the level of the carina. Unchanged right internal jugular central venous catheter with the tip in the distal SVC. The cardiomediastinal silhouette is normal in size. Normal pulmonary vascularity. Low lung volumes with unchanged retrocardiac opacity at the left lung base. No pleural effusion or pneumothorax. No acute osseous abnormality. IMPRESSION: 1. Persistent low lung volumes with unchanged retrocardiac opacity, atelectasis versus infiltrate. Electronically Signed   By: Obie Dredge M.D.   On: 03/11/2017 08:34   Dg Chest Portable 1 View  Result Date: 03/03/2017 CLINICAL DATA:  Central line placement EXAM: PORTABLE CHEST 1 VIEW COMPARISON:  2013 hours FINDINGS: Low lung volumes with atelectasis at the lung bases. Retrocardiac consolidation either representing a small focus of pneumonia or atelectasis is identified at the left lung base. Endotracheal tube tip is 3.1 cm above the carina. Right IJ central line catheter is seen in the distal SVC. No pneumothorax is noted. Azygos fissure is of incidental note. No acute osseous abnormality. IMPRESSION: 1. New right IJ catheter with tip in the distal SVC. No pneumothorax. 2. Endotracheal tube in satisfactory position. 3. Low lung volumes with atelectasis. Retrocardiac opacity is noted which likely represents atelectasis. A small focus of pneumonia is not entirely excluded. Electronically Signed   By: Tollie Eth M.D.   On: 03/08/2017 23:12   Dg Chest Portable 1 View  Result Date: 03/28/2017 CLINICAL DATA:  Intubation EXAM: PORTABLE CHEST 1  VIEW COMPARISON:  Portable exam 2013 hours compared to 01/08/2017 FINDINGS: Tip of endotracheal tube projects 4.0 cm above carina. Upper normal heart size. Azygos fissure. Mediastinal contours and pulmonary vascularity normal. Low lung volumes with mild LEFT basilar atelectasis. Additional opacity in retrocardiac LEFT lower lobe question infiltrate versus atelectasis. Upper lungs clear. No pleural effusion or pneumothorax. IMPRESSION: LEFT basilar atelectasis with atelectasis versus consolidation in retrocardiac LEFT lower lobe. Satisfactory endotracheal tube position. Electronically Signed   By: Ulyses Southward M.D.   On: 03/23/2017 20:40   ASSESSMENT AND PLAN:   Drury Ardizzone  is a 37 y.o. male who presents with unresponsiveness and found to be in DKA.  Patient was last known well early in the morning when his mother left for work, though he had been vomiting and feeling bad the night before.  When she returned home from work in the afternoon she found him unresponsive on the bathroom floor  1.  Acute  metabolic encephalopathy/unresponsiveness Multifactorial - -CT had positive for left subcutaneous hematoma patient was found down in the bathroom by mother -He is now intubated and on the ventilator -Neurology consultation appreciated -Patient started on broad-spectrum antibiotics along with IV acyclovir -EEG to further investigate myoclonic activity.  2.  Sepsis--no source identified -Continue broad-spectrum antibiotic with IV vancomycin, cefepime and acyclovir -Follow blood culture urine culture and CSF culture Monitor fever curve- Patient on stress doses of steroids- -continue 2 pressors vasopressin and Neo-Synephrine and levo fed  3.  DKA -On IV insulin drip  4.  Left upper extremity DVT -Now started on heparin drip -Vascular consultation appreciated  5. Acute renal failure in the setting of Above  -Seen by nephrology.  Minimal urine output.  Patient is going to be started on CRRT  6.   Elevated troponin suspected secondary to sepsis and demand ischemia with multiorgan failure  7.  DVT prophylaxis on heparin drip  Patient has overall a very poor prognosis.  He is critically ill with multiorgan failure.  Case discussed with Dr. Lonn Georgia CODE STATUS: Full  DVT Prophylaxis: Heparin drip  TOTAL TIME TAKING CARE OF THIS PATIENT: 35 minutes.  >50% time spent on counselling and coordination of care  Note: This dictation was prepared with Dragon dictation along with smaller phrase technology. Any transcriptional errors that result from this process are unintentional.  Enedina Finner M.D on 03/11/2017 at 4:07 PM  Between 7am to 6pm - Pager - [phone]  After 6pm go to [email or web address] - password [email or web address]  [facility]  Office  [phone]  CC: Primary care physician; Patient, No Pcp PerPatient ID: Deryl Giroux, male   DOB: 1980-03-28, 37 y.o.   MRN: [id number]

## 2017-03-11 NOTE — Progress Notes (Signed)
Inpatient Diabetes Program Recommendations  AACE/ADA: New Consensus Statement on Inpatient Glycemic Control (2015)  Target Ranges:  Prepandial:   less than 140 mg/dL      Peak postprandial:   less than 180 mg/dL (1-2 hours)      Critically ill patients:  140 - 180 mg/dL   Lab Results  Component Value Date   GLUCAP >600 (HH) 03/11/2017    Review of Glycemic Control  Results for Zachary Graves, Zachary Graves (MRN [id number]) as of 03/11/2017 08:16  Ref. Range 03/11/2017 03:01 03/11/2017 04:14 03/11/2017 05:03 03/11/2017 06:07 03/11/2017 06:58  Glucose-Capillary Latest Ref Range: 65 - 99 mg/dL >014 (HH) >103 (HH) >013 (HH) >600 (HH) >600 (HH)    Diabetes history: Type 2 from Care Everywhere dated 03/24/16, Type 1 dated 02/05/2015  Outpatient Diabetes medications: none listed on medication record (historical note dated 02/05/15- Glucotrol, Glucophage, NPH 70/30 45 units bid)  Current orders for Inpatient glycemic control: IV insulin- Phase 1 DKA order set  Inpatient Diabetes Program Recommendations: CBG >600 for numerous hours.  Discussed with RN- should stop IV insulin x 30 minutes and restart original multiplier of 0.01   Susette Racer, RN, [place], [place], CDE Diabetes Coordinator Inpatient Diabetes Program  [phone] (Team Pager) [phone] [facility] Office) 03/11/2017 8:29 AM

## 2017-03-12 ENCOUNTER — Inpatient Hospital Stay: Payer: Medicaid Other

## 2017-03-12 DIAGNOSIS — A419 Sepsis, unspecified organism: Principal | ICD-10-CM

## 2017-03-12 DIAGNOSIS — G9341 Metabolic encephalopathy: Secondary | ICD-10-CM

## 2017-03-12 DIAGNOSIS — I998 Other disorder of circulatory system: Secondary | ICD-10-CM

## 2017-03-12 DIAGNOSIS — R6521 Severe sepsis with septic shock: Secondary | ICD-10-CM

## 2017-03-12 LAB — BLOOD GAS, ARTERIAL
Acid-Base Excess: 0.7 mmol/L (ref 0.0–2.0)
Bicarbonate: 25.4 mmol/L (ref 20.0–28.0)
FIO2: 0.4
MECHVT: 650 mL
O2 SAT: 92.3 %
PATIENT TEMPERATURE: 37
PCO2 ART: 40 mmHg (ref 32.0–48.0)
PEEP: 5 cmH2O
PO2 ART: 64 mmHg — AB (ref 83.0–108.0)
RATE: 18 resp/min
pH, Arterial: 7.41 (ref 7.350–7.450)

## 2017-03-12 LAB — URINE CULTURE: CULTURE: NO GROWTH

## 2017-03-12 LAB — RENAL FUNCTION PANEL
ALBUMIN: 2.4 g/dL — AB (ref 3.5–5.0)
ALBUMIN: 2 g/dL — AB (ref 3.5–5.0)
ANION GAP: 10 (ref 5–15)
ANION GAP: 13 (ref 5–15)
Albumin: 2 g/dL — ABNORMAL LOW (ref 3.5–5.0)
Albumin: 1.8 g/dL — ABNORMAL LOW (ref 3.5–5.0)
Albumin: 1.9 g/dL — ABNORMAL LOW (ref 3.5–5.0)
Albumin: 1.8 g/dL — ABNORMAL LOW (ref 3.5–5.0)
Anion gap: 12 (ref 5–15)
Anion gap: 12 (ref 5–15)
Anion gap: 10 (ref 5–15)
Anion gap: 10 (ref 5–15)
BUN: 31 mg/dL — ABNORMAL HIGH (ref 6–20)
BUN: 32 mg/dL — AB (ref 6–20)
BUN: 33 mg/dL — AB (ref 6–20)
BUN: 34 mg/dL — AB (ref 6–20)
BUN: 35 mg/dL — ABNORMAL HIGH (ref 6–20)
BUN: 39 mg/dL — ABNORMAL HIGH (ref 6–20)
CALCIUM: 6.6 mg/dL — AB (ref 8.9–10.3)
CALCIUM: 6.3 mg/dL — AB (ref 8.9–10.3)
CHLORIDE: 101 mmol/L (ref 101–111)
CHLORIDE: 100 mmol/L — AB (ref 101–111)
CHLORIDE: 102 mmol/L (ref 101–111)
CO2: 24 mmol/L (ref 22–32)
CO2: 25 mmol/L (ref 22–32)
CO2: 26 mmol/L (ref 22–32)
CO2: 26 mmol/L (ref 22–32)
CO2: 26 mmol/L (ref 22–32)
CO2: 28 mmol/L (ref 22–32)
CREATININE: 2.59 mg/dL — AB (ref 0.61–1.24)
CREATININE: 2.35 mg/dL — AB (ref 0.61–1.24)
CREATININE: 2.52 mg/dL — AB (ref 0.61–1.24)
Calcium: 7.4 mg/dL — ABNORMAL LOW (ref 8.9–10.3)
Calcium: 6.6 mg/dL — ABNORMAL LOW (ref 8.9–10.3)
Calcium: 6.3 mg/dL — CL (ref 8.9–10.3)
Calcium: 6.8 mg/dL — ABNORMAL LOW (ref 8.9–10.3)
Chloride: 102 mmol/L (ref 101–111)
Chloride: 100 mmol/L — ABNORMAL LOW (ref 101–111)
Chloride: 104 mmol/L (ref 101–111)
Creatinine, Ser: 2.95 mg/dL — ABNORMAL HIGH (ref 0.61–1.24)
Creatinine, Ser: 2.53 mg/dL — ABNORMAL HIGH (ref 0.61–1.24)
Creatinine, Ser: 2.31 mg/dL — ABNORMAL HIGH (ref 0.61–1.24)
GFR calc Af Amer: 30 mL/min — ABNORMAL LOW (ref >60)
GFR calc Af Amer: 36 mL/min — ABNORMAL LOW (ref >60)
GFR calc Af Amer: 39 mL/min — ABNORMAL LOW (ref >60)
GFR calc Af Amer: 36 mL/min — ABNORMAL LOW (ref >60)
GFR calc Af Amer: 40 mL/min — ABNORMAL LOW (ref >60)
GFR calc non Af Amer: 26 mL/min — ABNORMAL LOW (ref >60)
GFR calc non Af Amer: 31 mL/min — ABNORMAL LOW (ref >60)
GFR calc non Af Amer: 35 mL/min — ABNORMAL LOW (ref >60)
GFR, EST AFRICAN AMERICAN: 35 mL/min — AB (ref >60)
GFR, EST NON AFRICAN AMERICAN: 30 mL/min — AB (ref >60)
GFR, EST NON AFRICAN AMERICAN: 34 mL/min — AB (ref >60)
GFR, EST NON AFRICAN AMERICAN: 31 mL/min — AB (ref >60)
GLUCOSE: 194 mg/dL — AB (ref 65–99)
GLUCOSE: 316 mg/dL — AB (ref 65–99)
GLUCOSE: 336 mg/dL — AB (ref 65–99)
GLUCOSE: 362 mg/dL — AB (ref 65–99)
Glucose, Bld: 322 mg/dL — ABNORMAL HIGH (ref 65–99)
Glucose, Bld: 279 mg/dL — ABNORMAL HIGH (ref 65–99)
PHOSPHORUS: 3.8 mg/dL (ref 2.5–4.6)
PHOSPHORUS: 5.2 mg/dL — AB (ref 2.5–4.6)
PHOSPHORUS: 5.2 mg/dL — AB (ref 2.5–4.6)
PHOSPHORUS: 6.7 mg/dL — AB (ref 2.5–4.6)
POTASSIUM: 3.5 mmol/L (ref 3.5–5.1)
POTASSIUM: 3.6 mmol/L (ref 3.5–5.1)
POTASSIUM: 3.7 mmol/L (ref 3.5–5.1)
POTASSIUM: 3.9 mmol/L (ref 3.5–5.1)
Phosphorus: 4.1 mg/dL (ref 2.5–4.6)
Phosphorus: 4.2 mg/dL (ref 2.5–4.6)
Potassium: 3.5 mmol/L (ref 3.5–5.1)
Potassium: 3.8 mmol/L (ref 3.5–5.1)
SODIUM: 138 mmol/L (ref 135–145)
SODIUM: 139 mmol/L (ref 135–145)
Sodium: 141 mmol/L (ref 135–145)
Sodium: 137 mmol/L (ref 135–145)
Sodium: 136 mmol/L (ref 135–145)
Sodium: 140 mmol/L (ref 135–145)

## 2017-03-12 LAB — CBC WITH DIFFERENTIAL/PLATELET
BASOS ABS: 0 10*3/uL (ref 0–0.1)
BASOS PCT: 0 %
EOS PCT: 0 %
Eosinophils Absolute: 0 10*3/uL (ref 0–0.7)
HCT: 40.5 % (ref 40.0–52.0)
Hemoglobin: 13 g/dL (ref 13.0–18.0)
Lymphocytes Relative: 9 %
Lymphs Abs: 1.2 10*3/uL (ref 1.0–3.6)
MCH: 26.9 pg (ref 26.0–34.0)
MCHC: 32.1 g/dL (ref 32.0–36.0)
MCV: 83.9 fL (ref 80.0–100.0)
MONO ABS: 0.6 10*3/uL (ref 0.2–1.0)
MONOS PCT: 4 %
Neutro Abs: 11.2 10*3/uL — ABNORMAL HIGH (ref 1.4–6.5)
Neutrophils Relative %: 87 %
PLATELETS: 224 10*3/uL (ref 150–440)
RBC: 4.82 MIL/uL (ref 4.40–5.90)
RDW: 14.8 % — AB (ref 11.5–14.5)
WBC: 13.1 10*3/uL — ABNORMAL HIGH (ref 3.8–10.6)

## 2017-03-12 LAB — GLUCOSE, CAPILLARY
GLUCOSE-CAPILLARY: 440 mg/dL — AB (ref 65–99)
GLUCOSE-CAPILLARY: 300 mg/dL — AB (ref 65–99)
GLUCOSE-CAPILLARY: 249 mg/dL — AB (ref 65–99)
GLUCOSE-CAPILLARY: 281 mg/dL — AB (ref 65–99)
GLUCOSE-CAPILLARY: 234 mg/dL — AB (ref 65–99)
GLUCOSE-CAPILLARY: 246 mg/dL — AB (ref 65–99)
GLUCOSE-CAPILLARY: 248 mg/dL — AB (ref 65–99)
GLUCOSE-CAPILLARY: 159 mg/dL — AB (ref 65–99)
GLUCOSE-CAPILLARY: 229 mg/dL — AB (ref 65–99)
Glucose-Capillary: 145 mg/dL — ABNORMAL HIGH (ref 65–99)
Glucose-Capillary: 152 mg/dL — ABNORMAL HIGH (ref 65–99)
Glucose-Capillary: 189 mg/dL — ABNORMAL HIGH (ref 65–99)
Glucose-Capillary: 227 mg/dL — ABNORMAL HIGH (ref 65–99)
Glucose-Capillary: 244 mg/dL — ABNORMAL HIGH (ref 65–99)
Glucose-Capillary: 255 mg/dL — ABNORMAL HIGH (ref 65–99)
Glucose-Capillary: 261 mg/dL — ABNORMAL HIGH (ref 65–99)
Glucose-Capillary: 279 mg/dL — ABNORMAL HIGH (ref 65–99)
Glucose-Capillary: 287 mg/dL — ABNORMAL HIGH (ref 65–99)
Glucose-Capillary: 302 mg/dL — ABNORMAL HIGH (ref 65–99)
Glucose-Capillary: 310 mg/dL — ABNORMAL HIGH (ref 65–99)
Glucose-Capillary: 313 mg/dL — ABNORMAL HIGH (ref 65–99)
Glucose-Capillary: 360 mg/dL — ABNORMAL HIGH (ref 65–99)

## 2017-03-12 LAB — MAGNESIUM
MAGNESIUM: 2.2 mg/dL (ref 1.7–2.4)
MAGNESIUM: 1.9 mg/dL (ref 1.7–2.4)
Magnesium: 1.8 mg/dL (ref 1.7–2.4)
Magnesium: 1.9 mg/dL (ref 1.7–2.4)
Magnesium: 2 mg/dL (ref 1.7–2.4)
Magnesium: 1.7 mg/dL (ref 1.7–2.4)

## 2017-03-12 LAB — HEPATIC FUNCTION PANEL
ALK PHOS: 110 U/L (ref 38–126)
ALT: 2181 U/L — AB (ref 17–63)
AST: 2115 U/L — ABNORMAL HIGH (ref 15–41)
Albumin: 1.8 g/dL — ABNORMAL LOW (ref 3.5–5.0)
BILIRUBIN TOTAL: 0.6 mg/dL (ref 0.3–1.2)
Total Protein: 4.6 g/dL — ABNORMAL LOW (ref 6.5–8.1)

## 2017-03-12 LAB — HEMOGLOBIN A1C
Hgb A1c MFr Bld: 15 % — ABNORMAL HIGH (ref 4.8–5.6)
Hgb A1c MFr Bld: 14.7 % — ABNORMAL HIGH (ref 4.8–5.6)
Mean Plasma Glucose: 383.8 mg/dL
Mean Plasma Glucose: 375.19 mg/dL

## 2017-03-12 LAB — APTT: APTT: 38 s — AB (ref 24–36)

## 2017-03-12 LAB — HIV ANTIBODY (ROUTINE TESTING W REFLEX): HIV Screen 4th Generation wRfx: NONREACTIVE

## 2017-03-12 LAB — HEPARIN LEVEL (UNFRACTIONATED)
HEPARIN UNFRACTIONATED: 0.19 [IU]/mL — AB (ref 0.30–0.70)
Heparin Unfractionated: 0.16 IU/mL — ABNORMAL LOW (ref 0.30–0.70)
Heparin Unfractionated: 0.52 IU/mL (ref 0.30–0.70)

## 2017-03-12 LAB — CK: CK TOTAL: 35820 U/L — AB (ref 49–397)

## 2017-03-12 LAB — PROCALCITONIN: Procalcitonin: 5.8 ng/mL

## 2017-03-12 LAB — VANCOMYCIN, TROUGH: Vancomycin Tr: 20 ug/mL (ref 15–20)

## 2017-03-12 MED ORDER — SODIUM CHLORIDE 0.9 % IV SOLN
1000.0000 mg | Freq: Once | INTRAVENOUS | Status: AC
  Administered 2017-03-12: 1000 mg via INTRAVENOUS
  Filled 2017-03-12: qty 10

## 2017-03-12 MED ORDER — CHLORHEXIDINE GLUCONATE CLOTH 2 % EX PADS
6.0000 | MEDICATED_PAD | Freq: Every day | CUTANEOUS | Status: AC
  Administered 2017-03-12 – 2017-03-16 (×5): 6 via TOPICAL

## 2017-03-12 MED ORDER — MUPIROCIN 2 % EX OINT
1.0000 "application " | TOPICAL_OINTMENT | Freq: Two times a day (BID) | CUTANEOUS | Status: AC
  Administered 2017-03-12 – 2017-03-16 (×10): 1 via NASAL
  Filled 2017-03-12 (×2): qty 22

## 2017-03-12 MED ORDER — HEPARIN BOLUS VIA INFUSION
3000.0000 [IU] | Freq: Once | INTRAVENOUS | Status: AC
  Administered 2017-03-12: 3000 [IU] via INTRAVENOUS
  Filled 2017-03-12: qty 3000

## 2017-03-12 MED ORDER — HEPARIN BOLUS VIA INFUSION
3200.0000 [IU] | Freq: Once | INTRAVENOUS | Status: AC
  Administered 2017-03-12: 3200 [IU] via INTRAVENOUS
  Filled 2017-03-12: qty 3200

## 2017-03-12 MED ORDER — SODIUM BICARBONATE 8.4 % IV SOLN
INTRAVENOUS | Status: DC
  Administered 2017-03-12 – 2017-03-15 (×7): via INTRAVENOUS
  Filled 2017-03-12 (×10): qty 150

## 2017-03-12 MED ORDER — POTASSIUM CHLORIDE 10 MEQ/50ML IV SOLN
10.0000 meq | INTRAVENOUS | Status: AC
  Administered 2017-03-12 – 2017-03-13 (×2): 10 meq via INTRAVENOUS
  Filled 2017-03-12 (×2): qty 50

## 2017-03-12 MED ORDER — MAGNESIUM SULFATE IN D5W 1-5 GM/100ML-% IV SOLN
1.0000 g | Freq: Once | INTRAVENOUS | Status: AC
  Administered 2017-03-12: 1 g via INTRAVENOUS
  Filled 2017-03-12: qty 100

## 2017-03-12 MED ORDER — SODIUM CHLORIDE 0.9 % IV SOLN
2.0000 g | Freq: Once | INTRAVENOUS | Status: AC
  Administered 2017-03-12: 2 g via INTRAVENOUS
  Filled 2017-03-12: qty 20

## 2017-03-12 MED ORDER — MIDAZOLAM HCL 2 MG/2ML IJ SOLN
4.0000 mg | Freq: Once | INTRAMUSCULAR | Status: AC
  Administered 2017-03-12: 4 mg via INTRAVENOUS
  Filled 2017-03-12: qty 4

## 2017-03-12 MED ORDER — SODIUM CHLORIDE 0.9 % IV SOLN: 1.0000 g | INTRAVENOUS | Status: DC

## 2017-03-12 MED ORDER — SODIUM CHLORIDE 0.9 % IV SOLN
1.0000 g | Freq: Once | INTRAVENOUS | Status: AC
  Administered 2017-03-12: 1 g via INTRAVENOUS
  Filled 2017-03-12 (×2): qty 10

## 2017-03-12 NOTE — Progress Notes (Addendum)
21: 49: Patient had a tonic-clonic seizure lasting 2 minutes(2133-2135). Given 2mg  of versed. Seizure activity subsided. Neurology consulted. Spoke with Dr. Wilford Corner who recommended loading patient with Keppra and to give lorazepam or midazolam if seizure activity lasts more than 5 minutes. Also to obtain a CT head without contrast to r/o ICH since patient is on a heparin gtte. Will continue to monitor Patient's mother updated at 2300  Magdalene S. [facility] ANP-BC Pulmonary and Critical Care Medicine [facility] Pager [phone] or [phone]

## 2017-03-12 NOTE — Progress Notes (Signed)
Notified Dr Lonn Georgia regarding calcium level of 6.3

## 2017-03-12 NOTE — Progress Notes (Signed)
[facility]  ROUNDING NOTE   Subjective:   CRRT initiated last night. HD temp cath had to be changed to right IJ.   Patient tolerating CRRT. No UF.   Off phenylephrine  UOP 750  Tmax 103.5  Objective:  Vital signs in last 24 hours:  Temp:  [97.4 F (36.3 C)-102.3 F (39.1 C)] 97.8 F (36.6 C) (01/11 0700) Pulse Rate:  [31-91] 73 (01/11 0830) Resp:  [15-21] 18 (01/11 0830) BP: (68-126)/(51-109) 103/70 (01/11 0830) SpO2:  [91 %-100 %] 95 % (01/11 0830) FiO2 (%):  [40 %] 40 % (01/11 0810) Weight:  [157.7 kg (347 lb 10.7 oz)] 157.7 kg (347 lb 10.7 oz) (01/11 0500)  Weight change: 12.5 kg (27 lb 10.7 oz) Filed Weights   03/11/17 0010 03/11/17 0453 03/12/17 0500  Weight: (!) 146.1 kg (322 lb 1.5 oz) (!) 146.1 kg (322 lb 1.5 oz) (!) 157.7 kg (347 lb 10.7 oz)    Intake/Output: I/O last 3 completed shifts: In: 6870.9 [I.V.:5830.9; IV Piggyback:1040] Out: 2750 [Urine:2750]   Intake/Output this shift:  No intake/output data recorded.  Physical Exam: General: Critically ill  Head: Left parietal ecchymosis, ETT, OGT  Eyes: Eyes closed  Neck: RIJ temp HD catheter  Lungs:  PRVC 40%  Heart: Regular rate and rhythm  Abdomen:  Soft, nontender, obese  Extremities: + peripheral edema. Left upper extremity edema  Neurologic: Intubated, sedated  Skin: Multiple eccymosis  Access: RIJ temp HD catheter 0/10    Basic Metabolic Panel: Recent Labs  Lab 03/11/17 1543 03/11/17 1928 03/12/17 0015 03/12/17 0321 03/12/17 0711  NA 139 140 141 137 138  K 3.4* 3.7 3.9 3.5 3.8  CL 105 106 102 101 100*  CO2 20* 22 26 24 26   GLUCOSE 403* 343* 336* 322* 316*  BUN 38* 38* 39* 35* 34*  CREATININE 2.75* 2.62* 2.95* 2.59* 2.53*  CALCIUM 6.3* 7.2* 7.4* 6.6* 6.6*  MG 2.1 2.1 2.2 1.8 1.9  PHOS 5.7* 6.6* 6.7* 5.2* 5.2*    Liver Function Tests: Recent Labs  Lab 04/01/2017 2012 03/11/17 0428 03/11/17 1543 03/11/17 1928 03/12/17 0015 03/12/17 0321 03/12/17 0711  AST  4,855* >10,000*  --   --   --   --   --   ALT 2,250* 3,543*  --   --   --   --   --   ALKPHOS 279*  --   --   --   --   --   --   BILITOT 1.2  --   --   --   --   --   --   PROT 8.3*  --   --   --   --   --   --   ALBUMIN 3.6  --  2.3* 2.3* 2.4* 2.0* 2.0*   Recent Labs  Lab 03/11/17 1543  LIPASE 1,539*  AMYLASE 789*   No results for input(s): AMMONIA in the last 168 hours.  CBC: Recent Labs  Lab 03/08/2017 2012 03/11/17 0428 03/12/17 0533  WBC 13.4* 14.4* 13.1*  NEUTROABS 11.5*  --  11.2*  HGB 15.5 14.7 13.0  HCT 54.0* 46.6 40.5  MCV 94.1 86.2 83.9  PLT 379 355 224    Cardiac Enzymes: Recent Labs  Lab 03/09/2017 2012 03/11/17 0113 03/11/17 0840 03/11/17 1241 03/11/17 1543 03/12/17 0321  CKTOTAL 38,049*  --   --   --  >50,000* >50,000*  TROPONINI  --  0.67* 1.46* 1.35*  --   --  BNP: Invalid input(s): POCBNP  CBG: Recent Labs  Lab 03/12/17 0416 03/12/17 0521 03/12/17 0626 03/12/17 0733 03/12/17 0837  GLUCAP 255* 440* 310* 302* 300*    Microbiology: Results for orders placed or performed during the hospital encounter of 04/01/2017  Blood Culture (routine x 2)     Status: None (Preliminary result)   Collection Time: 03/27/2017 11:28 PM  Result Value Ref Range Status   Specimen Description BLOOD LT [facility]  Final   Special Requests   Final    BOTTLES DRAWN AEROBIC AND ANAEROBIC Blood Culture adequate volume   Culture   Final    NO GROWTH 2 DAYS Performed at [facility], [address], [place], [place] [address]    Report Status PENDING  Incomplete  Blood Culture (routine x 2)     Status: None (Preliminary result)   Collection Time: 03/24/2017 11:28 PM  Result Value Ref Range Status   Specimen Description BLOOD RT [facility]  Final   Special Requests   Final    BOTTLES DRAWN AEROBIC AND ANAEROBIC Blood Culture adequate volume   Culture   Final    NO GROWTH 2 DAYS Performed at [facility], [address], [place], [place] [address]    Report  Status PENDING  Incomplete  MRSA PCR Screening     Status: Abnormal   Collection Time: 03/11/17 12:20 AM  Result Value Ref Range Status   MRSA by PCR POSITIVE (A) NEGATIVE Final    Comment:        The GeneXpert MRSA Assay (FDA approved for NASAL specimens only), is one component of a comprehensive MRSA colonization surveillance program. It is not intended to diagnose MRSA infection nor to guide or monitor treatment for MRSA infections. RESULT CALLED TO, READ BACK BY AND VERIFIED WITH: BARBARA THAO ON 03/11/17 AT 0310 JAG Performed at [facility], [address] [place], [place] [address]   Aerobic/Anaerobic Culture (surgical/deep wound)     Status: None (Preliminary result)   Collection Time: 03/11/17 12:53 AM  Result Value Ref Range Status   Specimen Description   Final    ANKLE RIGHT ANKLE Performed at [facility], [address], [place], [place] [address]    Special Requests   Final    NONE Performed at [facility], [address] [place], [place] [address]    Gram Stain   Final    RARE WBC PRESENT, PREDOMINANTLY PMN FEW GRAM POSITIVE RODS RARE GRAM POSITIVE COCCI IN PAIRS RARE YEAST Performed at [facility], [place] [address] [place], [place] [address]    Culture PENDING  Incomplete   Report Status PENDING  Incomplete    Coagulation Studies: Recent Labs    03/11/17 1116  LABPROT 16.5*  INR 1.34    Urinalysis: Recent Labs    03/23/2017 2012  COLORURINE YELLOW*  LABSPEC 1.024  PHURINE 6.0  GLUCOSEU >=500*  HGBUR LARGE*  BILIRUBINUR NEGATIVE  KETONESUR NEGATIVE  PROTEINUR 30*  NITRITE NEGATIVE  LEUKOCYTESUR NEGATIVE      Imaging: Dg Ankle Complete Right  Result Date: 03/11/2017 CLINICAL DATA:  Osteomyelitis of the right ankle. EXAM: RIGHT ANKLE - COMPLETE 3+ VIEW COMPARISON:  12/07/2016 FINDINGS: Soft tissue swelling and possibly ulceration along the medial aspect of the right ankle. Periosteal thickening is  identified along the distal diaphysis of the tibia and may reflect stigmata of chronic venous stasis or chronic osteomyelitis, favor venous stasis given mild-to-moderate soft tissue edema and vascular clips about  the midfoot. No frank bone destruction, fracture or joint dislocation. Calcaneal enthesopathy is noted posteriorly. IMPRESSION: Periosteal new bone formation along the diaphysis of the tibia more likely to represent stigmata of chronic venous stasis. Chronic osteomyelitis could also have a similar appearance. No conclusive evidence for acute osteomyelitis or fracture. Electronically Signed   By: Ashley Royalty M.D.   On: 03/11/2017 01:32   Dg Abd 1 View  Result Date: 03/11/2017 CLINICAL DATA:  Central line placement. EXAM: ABDOMEN - 1 VIEW COMPARISON:  None. FINDINGS: Left femoral central line in place with tip projected over the left upper pelvis. IMPRESSION: Left femoral central line in place with tip projected over the left upper pelvis, tip at the level of the lower left sacrum. Electronically Signed   By: Franki Cabot M.D.   On: 03/11/2017 21:26   Dg Abd 1 View  Result Date: 03/11/2017 CLINICAL DATA:  Second attempt and orogastric tube placement. EXAM: ABDOMEN - 1 VIEW COMPARISON:  03/11/2017 at 0036 hours FINDINGS: The tip and side port of a gastric tube are visualized below the left hemidiaphragm. The side-port is just slightly caudal to the expected location of the GE junction. No free air or bowel dilatation is visualized. IMPRESSION: The tip and side port of a gastric tube are seen in the expected location of the stomach. The side port however is just slightly caudal to the level of the GE junction. Further advancement by 3-4 cm would help improve positioning. Electronically Signed   By: Ashley Royalty M.D.   On: 03/11/2017 02:48   Dg Abd 1 View  Result Date: 03/11/2017 CLINICAL DATA:  Orogastric tube placement EXAM: ABDOMEN - 1 VIEW COMPARISON:  None. FINDINGS: The tip of the orogastric  tube overlies the gastric body. The side port is just distal to the gastroesophageal junction. Advancement by 5-7 cm is recommended. Normal bowel gas pattern. IMPRESSION: Recommend advancement of the orogastric tube by 5-7 cm to ensure that the side port is within the stomach. Electronically Signed   By: Ulyses Jarred M.D.   On: 03/11/2017 01:25   Ct Head Wo Contrast  Result Date: 03/11/2017 CLINICAL DATA:  37 year old male with altered mental status. EXAM: CT HEAD WITHOUT CONTRAST TECHNIQUE: Contiguous axial images were obtained from the base of the skull through the vertex without intravenous contrast. COMPARISON:  None. FINDINGS: Brain: No evidence of acute infarction, hemorrhage, hydrocephalus, extra-axial collection or mass lesion/mass effect. Vascular: No hyperdense vessel or unexpected calcification. Skull: Normal. Negative for fracture or focal lesion. Sinuses/Orbits: There is diffuse mucoperiosteal thickening of paranasal sinuses with near complete opacification of the left maxillary sinus. The mastoid air cells are clear. Other: There is moderate left temporal subcutaneous hematoma. Correlation with clinical exam recommended. Small pockets of air in the right masticator space likely intravascular air and iatrogenic. IMPRESSION: 1. Normal noncontrast CT of the brain. 2. Moderate left temporal subcutaneous hematoma. 3. Paranasal sinus disease. Electronically Signed   By: Anner Crete M.D.   On: 03/11/2017 00:29   US Venous Img Upper Uni Left  Result Date: 03/11/2017 CLINICAL DATA:  Left upper extremity swelling.  Evaluate for DVT. EXAM: LEFT UPPER EXTREMITY VENOUS DOPPLER ULTRASOUND TECHNIQUE: Gray-scale sonography with graded compression, as well as color Doppler and duplex ultrasound were performed to evaluate the upper extremity deep venous system from the level of the subclavian vein and including the jugular, axillary, basilic, radial, ulnar and upper cephalic vein. Spectral Doppler was  utilized to evaluate flow at rest and with  distal augmentation maneuvers. COMPARISON:  None. FINDINGS: Contralateral Subclavian Vein: Respiratory phasicity is normal and symmetric with the symptomatic side. No evidence of thrombus. Normal compressibility. Internal Jugular Vein: No evidence of thrombus. Normal compressibility, respiratory phasicity and response to augmentation. Subclavian Vein: There is hypoechoic occlusive thrombus involving the peripheral aspect of the left subclavian vein (image 9 and 10). The central aspect the left subclavian vein appears patent where imaged. Axillary Vein: There is hypoechoic occlusive thrombus within the left axillary vein (image 13) extending to involve one of the paired brachial veins (images 25 - 27). Cephalic Vein: No evidence of thrombus. Normal compressibility, respiratory phasicity and response to augmentation. Basilic Vein: No evidence of thrombus. Normal compressibility, respiratory phasicity and response to augmentation. Brachial Veins: There is hypoechoic occlusive thrombus within 1 of the paired left brachial veins (images 25-27. The adjacent paired brachial vein appears widely patent. Radial Veins: No evidence of thrombus. Normal compressibility, respiratory phasicity and response to augmentation. Ulnar Veins: No evidence of thrombus. Normal compressibility, respiratory phasicity and response to augmentation. Other Findings:  None visualized. IMPRESSION: The examination is positive for occlusive DVT extending from the peripheral aspect of the left subclavian vein to involve the left axillary and one of the paired left brachial veins. These results will be called to the ordering clinician or representative by the Radiologist Assistant, and communication documented in the PACS or zVision Dashboard. Electronically Signed   By: Sandi Mariscal M.D.   On: 03/11/2017 13:16   Dg Chest Port 1 View  Result Date: 03/12/2017 CLINICAL DATA:  Hypoxia EXAM: PORTABLE CHEST 1  VIEW COMPARISON:  March 11, 2017 FINDINGS: Endotracheal tube tip is 4.2 cm above the carina. Central catheter tip is in the superior vena cava. No pneumothorax. There is no appreciable edema or consolidation. Heart is upper normal in size with pulmonary vascularity within normal limits. No adenopathy. No bone lesions. IMPRESSION: Tube and catheter positions as described without pneumothorax. No edema or consolidation. Stable cardiac silhouette. Electronically Signed   By: Lowella Grip III M.D.   On: 03/12/2017 07:09   Dg Chest Port 1 View  Result Date: 03/12/2017 CLINICAL DATA:  Central line placement EXAM: PORTABLE CHEST 1 VIEW COMPARISON:  Chest radiograph 03/11/2017 at 7:17 a.m. FINDINGS: Unchanged position of endotracheal tube with tip at the level of the clavicular heads. There is a right internal jugular vein approach central venous catheter with tip in the lower SVC. Shallow lung inflation without focal airspace consolidation or pulmonary edema. Supine radiography limits sensitivity for the detection of pneumothorax, but none is visualized. IMPRESSION: Right IJ central venous catheter with tip in the lower SVC. Electronically Signed   By: Ulyses Jarred M.D.   On: 03/12/2017 00:09   Dg Chest Port 1 View  Result Date: 03/11/2017 CLINICAL DATA:  Acute respiratory failure. EXAM: PORTABLE CHEST 1 VIEW COMPARISON:  Chest x-ray from yesterday. FINDINGS: Endotracheal tube remains in place with the tip approximately 2.3 cm above the level of the carina. Unchanged right internal jugular central venous catheter with the tip in the distal SVC. The cardiomediastinal silhouette is normal in size. Normal pulmonary vascularity. Low lung volumes with unchanged retrocardiac opacity at the left lung base. No pleural effusion or pneumothorax. No acute osseous abnormality. IMPRESSION: 1. Persistent low lung volumes with unchanged retrocardiac opacity, atelectasis versus infiltrate. Electronically Signed   By:  Titus Dubin M.D.   On: 03/11/2017 08:34   Dg Chest Portable 1 View  Result Date: 03/18/2017 CLINICAL DATA:  Central line placement EXAM: PORTABLE CHEST 1 VIEW COMPARISON:  2013 hours FINDINGS: Low lung volumes with atelectasis at the lung bases. Retrocardiac consolidation either representing a small focus of pneumonia or atelectasis is identified at the left lung base. Endotracheal tube tip is 3.1 cm above the carina. Right IJ central line catheter is seen in the distal SVC. No pneumothorax is noted. Azygos fissure is of incidental note. No acute osseous abnormality. IMPRESSION: 1. New right IJ catheter with tip in the distal SVC. No pneumothorax. 2. Endotracheal tube in satisfactory position. 3. Low lung volumes with atelectasis. Retrocardiac opacity is noted which likely represents atelectasis. A small focus of pneumonia is not entirely excluded. Electronically Signed   By: Ashley Royalty M.D.   On: 03/11/2017 23:12   Dg Chest Portable 1 View  Result Date: 03/14/2017 CLINICAL DATA:  Intubation EXAM: PORTABLE CHEST 1 VIEW COMPARISON:  Portable exam 2013 hours compared to 01/08/2017 FINDINGS: Tip of endotracheal tube projects 4.0 cm above carina. Upper normal heart size. Azygos fissure. Mediastinal contours and pulmonary vascularity normal. Low lung volumes with mild LEFT basilar atelectasis. Additional opacity in retrocardiac LEFT lower lobe question infiltrate versus atelectasis. Upper lungs clear. No pleural effusion or pneumothorax. IMPRESSION: LEFT basilar atelectasis with atelectasis versus consolidation in retrocardiac LEFT lower lobe. Satisfactory endotracheal tube position. Electronically Signed   By: Lavonia Dana M.D.   On: 03/29/2017 20:40     Medications:   . sodium chloride    . acyclovir Stopped (03/12/17 0215)  . cefTRIAXone (ROCEPHIN)  IV Stopped (03/12/17 0114)  . dexmedetomidine (PRECEDEX) IV infusion 0.4 mcg/kg/hr (03/12/17 0525)  . dextrose 5 % and 0.45% NaCl    . famotidine  (PEPCID) IV Stopped (03/12/17 0046)  . fentaNYL 400 mcg/hr (03/12/17 0721)  . heparin 2,100 Units/hr (03/12/17 0134)  . insulin (NOVOLIN-R) infusion 12 Units/hr (03/12/17 0839)  . norepinephrine (LEVOPHED) Adult infusion 15 mcg/min (03/12/17 0735)  . phenylephrine (NEO-SYNEPHRINE) Adult infusion Stopped (03/11/17 1635)  . pureflow    .  sodium bicarbonate (isotonic) infusion in sterile water 100 mL/hr at 03/11/17 2220  . sodium chloride     And  . sodium chloride     And  . sodium chloride     And  . sodium chloride     And  . sodium chloride    . vancomycin 1,500 mg (03/12/17 0454)  . vasopressin (PITRESSIN) infusion - *FOR SHOCK* 0.03 Units/min (03/11/17 0150)   . alteplase  2 mg Intracatheter Once  . alteplase  2 mg Intracatheter Once  . chlorhexidine gluconate (MEDLINE KIT)  15 mL Mouth Rinse BID  . Chlorhexidine Gluconate Cloth  6 each Topical Q0600  . hydrocortisone sod succinate (SOLU-CORTEF) inj  50 mg Intravenous Q6H  . mouth rinse  15 mL Mouth Rinse 10 times per day  . mupirocin ointment  1 application Nasal BID   sodium chloride, acetaminophen, heparin, midazolam, ondansetron (ZOFRAN) IV  Assessment/ Plan:  Zachary Graves is a 37 y.o. white male with insulin dependent diabetes mellitus, hypertension, morbid obesity, diabetic peripheral neuropathy, Legg-calve-Perthes Disease, osteoarthritis, who was admitted to [facility] on 03/06/2017 for diabetic ketoacidosis  1. Acute Renal Failure: nonoliguric urine output. Baseline creatinine of 0.94, normal GFR on 01/08/17.  Requiring CRRT CVVHD therapy rate 1500, BFR 250, UF 0.   2. Diabetic ketoacidosis/metabolic acidosis. Diabetes mellitus type I Insulin Dependent. Poorly controlled, Hemoglobin A1c 15%.  - on DKA protocol. Anion gap closing  3. Shock: with suspected sepsis - cultures negative.  On empiric ceftriaxone and vancomycin. Systemic steroids.   4. Rhabdomyolysis - CPK >50,000 - Bicarb gtt  5. Transaminitis: secondary  to shock liver - pending new AST/ALT today  6. Acute Pancreatitis: lipase and amylase elevated       LOS: 2 Zachary Graves 1/11/20199:26 AM

## 2017-03-12 NOTE — Progress Notes (Addendum)
ANTICOAGULATION CONSULT NOTE  Pharmacy Consult for Heparin Indication: upper extremity DVT  Allergies  Allergen Reactions  . Naproxen   . Sulfa Antibiotics Hives    Patient Measurements: Height: 5\' 9"  (175.3 cm) Weight: (!) 347 lb 10.7 oz (157.7 kg) IBW/kg (Calculated) : 70.7 Heparin Dosing Weight: 105 kg  Vital Signs: Temp: 97.6 F (36.4 C) (01/11 1900) Temp Source: Core (01/11 1900) BP: 113/76 (01/11 1900) Pulse Rate: 70 (01/11 1900)  Labs: Recent Labs    03/28/2017 2012 03/11/17 0113 03/11/17 0428 03/11/17 0840 03/11/17 1115 03/11/17 1116 03/11/17 1241 03/11/17 1543  03/12/17 0015 03/12/17 0321 03/12/17 0533 03/12/17 0711 03/12/17 0739 03/12/17 1244 03/12/17 1446 03/12/17 1524 03/12/17 1936  HGB 15.5  --  14.7  --   --   --   --   --   --   --   --  13.0  --   --   --   --   --   --   HCT 54.0*  --  46.6  --   --   --   --   --   --   --   --  40.5  --   --   --   --   --   --   PLT 379  --  355  --   --   --   --   --   --   --   --  224  --   --   --   --   --   --   APTT  --   --   --   --  27  --   --   --   --   --   --  38*  --   --   --   --   --   --   LABPROT  --   --   --   --   --  16.5*  --   --   --   --   --   --   --   --   --   --   --   --   INR  --   --   --   --   --  1.34  --   --   --   --   --   --   --   --   --   --   --   --   HEPARINUNFRC  --   --   --   --   --   --   --   --   --  0.16*  --   --   --  0.19*  --   --   --  0.52  CREATININE 3.09* 2.81* 2.69* 2.94*  --   --  2.96* 2.75*   < > 2.95* 2.59*  --  2.53*  --  2.35* 2.52*  --   --   CKTOTAL 38,049*  --   --   --   --   --   --  >50,000*  --   --  >50,000*  --   --   --   --   --  35,820*  --   TROPONINI  --  0.67*  --  1.46*  --   --  1.35*  --   --   --   --   --   --   --   --   --   --   --    < > =  values in this interval not displayed.    Estimated Creatinine Clearance: 60.5 mL/min (A) (by C-G formula based on SCr of 2.52 mg/dL (H)).   Medical History: Past Medical  History:  Diagnosis Date  . Back pain   . Diabetes 1.5, managed as type 1 (HCC)   . Legg-Calve-Perthes disease   . Neuropathic pain   . Obesity   . Osteoarthritis     Assessment: 37 y/o M admitted with unresponsiveness and ventilated with DKA and found to have upper extremity DVT.   HL = 0.52, therapeutic.   Goal of Therapy:  Heparin level 0.3-0.7 units/ml Monitor platelets by anticoagulation protocol: Yes   Plan:  Heparin level was drawn late but is therapeutic. Will order another heparin level in 6 hours to confirm that heparin does not need to be adjusted.   Yolanda Bonine, PharmD Pharmacy Resident 03/12/2017 8:19 PM    01/12 AM heparin level 0.43. Continue current regimen. Recheck heparin level and CBC with tomorrow AM labs.  Fulton Reek, PharmD, BCPS  03/13/17 4:43 AM   \

## 2017-03-12 NOTE — Progress Notes (Signed)
Inpatient Diabetes Program Recommendations  AACE/ADA: New Consensus Statement on Inpatient Glycemic Control (2015)  Target Ranges:  Prepandial:   less than 140 mg/dL      Peak postprandial:   less than 180 mg/dL (1-2 hours)      Critically ill patients:  140 - 180 mg/dL   Lab Results  Component Value Date   GLUCAP 249 (H) 03/12/2017   HGBA1C 15.0 (H) 03/11/2017    Recommend patient remain on IV insulin during critical illness.  RD saw patient yesterday and would like to begin feeds once OGT is placed and patient is more hemodynamically stable.  Rates will begin as a trickle and to increase to 77ml/hour plus bid. This will affect blood sugar control and may be difficult to mange with SQ insulin.   Susette Racer, RN, BA, MHA, CDE Diabetes Coordinator Inpatient Diabetes Program  [phone] (Team Pager) [phone] [facility] Office) 03/12/2017 1:09 PM

## 2017-03-12 NOTE — Progress Notes (Signed)
Per Dr Lonn Georgia start titrating fentanyl down. Per orders this am no wake up assessment performed. Patient not following commands but does withdrawal to pain.

## 2017-03-12 NOTE — Consult Note (Signed)
WOC Nurse wound consult note Reason for Consult:Chronic nonhealing wounds to right lateral malleolus and right lateral foot Left arm with ecchymosis.  Vascular services has consulted and will defer to them for orders.  Bruising and abrasion to left side head from fall.  Wound type: neuropathic ulcer, chronic nonhealing, seen at wound care center.  Has noted allergy to sulfa drugs so I am reluctant to implement silver dressings.  WOund bed is pale pink and nongranulating Pressure Injury POA:NA Measurement: right medial foot:  2 cm x  2 cm  X0.3 cm  Right medial malleolus:  1 cm x 0.6 cm  X 0.2 cm  Wound VVO:HYWV pink nongranulating Drainage (amount, consistency, odor) minimal serosanguinous  No odor Periwound: Dry skin with dry scabbed lesions to feet Dressing procedure/placement/frequency: Cleanse wounds to right foot with NS.  Apply calcium alginate to wound bed.  Cover with ABD pad and tape.  Change daily.  Will not follow at this time.  Please re-consult if needed.  Maple Hudson RN BSN CWON Pager [phone]

## 2017-03-12 NOTE — Progress Notes (Signed)
[facility] [place] [facility]   PATIENT NAME: Zachary Graves    MR#:  [id number]  DATE OF BIRTH:  27-Jan-1981  SUBJECTIVE:  Patient currently on levo fed and Neo-Synephrine drip Very restless.  Patient now intubated and on the ventilator  REVIEW OF SYSTEMS:   Review of Systems  Unable to perform ROS: Intubated   Tolerating Diet:npo Tolerating PT:  Intubated DRUG ALLERGIES:   Allergies  Allergen Reactions  . Naproxen   . Sulfa Antibiotics Hives    VITALS:  Blood pressure 92/66, pulse 70, temperature 97.7 F (36.5 C), resp. rate 18, height 5\' 9"  (1.753 m), weight (!) 157.7 kg (347 lb 10.7 oz), SpO2 95 %.  PHYSICAL EXAMINATION:   Physical Exam  GENERAL:  37 y.o.-year-old patient lying in the bed with no acute distress.  Morbidly obese critically ill eYES: Pupils equal, round, reactive to light and accommodation. No scleral icterus. .  Intubated HEENT: Head atraumatic, normocephalic. Oropharynx and nasopharynx clear.  NECK:  Supple, no jugular venous distention. No thyroid enlargement, no tenderness.  LUNGS: Normal breath sounds bilaterally, no wheezing, rales, rhonchi. No use of accessory muscles of respiration.  CARDIOVASCULAR: S1, S2 normal. No murmurs, rubs, or gallops.  ABDOMEN: Soft, nontender, nondistended. Bowel sounds present. No organomegaly or mass.  EXTREMITIES: Cold and cyanotic digits left upper extremity cold - both lower extremity cold nEUROLOGIC: Cranial nerves II through XII are intact. No focal Motor or sensory deficits b/l.   PSYCHIATRIC: Patient is intubated and on the ventilator SKIN: No obvious rash, lesion, or ulcer.   LABORATORY PANEL:  CBC Recent Labs  Lab 03/12/17 0533  WBC 13.1*  HGB 13.0  HCT 40.5  PLT 224    Chemistries  Recent Labs  Lab 03/12/17 1244 03/12/17 1446  NA 136 140  K 3.6 3.7  CL 100* 102  CO2 26 28  GLUCOSE 362* 279*  BUN 32* 33*  CREATININE 2.35* 2.52*  CALCIUM 6.3* 6.8*  MG 1.9  2.0  AST 2,115*  --   ALT 2,181*  --   ALKPHOS 110  --   BILITOT 0.6  --    Cardiac Enzymes Recent Labs  Lab 03/11/17 1241  TROPONINI 1.35*   RADIOLOGY:  Dg Ankle Complete Right  Result Date: 03/11/2017 CLINICAL DATA:  Osteomyelitis of the right ankle. EXAM: RIGHT ANKLE - COMPLETE 3+ VIEW COMPARISON:  12/07/2016 FINDINGS: Soft tissue swelling and possibly ulceration along the medial aspect of the right ankle. Periosteal thickening is identified along the distal diaphysis of the tibia and may reflect stigmata of chronic venous stasis or chronic osteomyelitis, favor venous stasis given mild-to-moderate soft tissue edema and vascular clips about the midfoot. No frank bone destruction, fracture or joint dislocation. Calcaneal enthesopathy is noted posteriorly. IMPRESSION: Periosteal new bone formation along the diaphysis of the tibia more likely to represent stigmata of chronic venous stasis. Chronic osteomyelitis could also have a similar appearance. No conclusive evidence for acute osteomyelitis or fracture. Electronically Signed   By: Tollie Eth M.D.   On: 03/11/2017 01:32   Dg Abd 1 View  Result Date: 03/12/2017 CLINICAL DATA:  OG tube placement. EXAM: ABDOMEN - 1 VIEW COMPARISON:  03/11/2017. FINDINGS: OG tube noted with tip below left hemidiaphragm. No bowel distention. IMPRESSION: OG tube noted with tip below left hemidiaphragm. Electronically Signed   By: Maisie Fus  Register   On: 03/12/2017 15:54   Dg Abd 1 View  Result Date: 03/11/2017 CLINICAL DATA:  Central line placement. EXAM:  ABDOMEN - 1 VIEW COMPARISON:  None. FINDINGS: Left femoral central line in place with tip projected over the left upper pelvis. IMPRESSION: Left femoral central line in place with tip projected over the left upper pelvis, tip at the level of the lower left sacrum. Electronically Signed   By: Bary Richard M.D.   On: 03/11/2017 21:26   Dg Abd 1 View  Result Date: 03/11/2017 CLINICAL DATA:  Second attempt and  orogastric tube placement. EXAM: ABDOMEN - 1 VIEW COMPARISON:  03/11/2017 at 0036 hours FINDINGS: The tip and side port of a gastric tube are visualized below the left hemidiaphragm. The side-port is just slightly caudal to the expected location of the GE junction. No free air or bowel dilatation is visualized. IMPRESSION: The tip and side port of a gastric tube are seen in the expected location of the stomach. The side port however is just slightly caudal to the level of the GE junction. Further advancement by 3-4 cm would help improve positioning. Electronically Signed   By: Tollie Eth M.D.   On: 03/11/2017 02:48   Dg Abd 1 View  Result Date: 03/11/2017 CLINICAL DATA:  Orogastric tube placement EXAM: ABDOMEN - 1 VIEW COMPARISON:  None. FINDINGS: The tip of the orogastric tube overlies the gastric body. The side port is just distal to the gastroesophageal junction. Advancement by 5-7 cm is recommended. Normal bowel gas pattern. IMPRESSION: Recommend advancement of the orogastric tube by 5-7 cm to ensure that the side port is within the stomach. Electronically Signed   By: Deatra Robinson M.D.   On: 03/11/2017 01:25   Ct Head Wo Contrast  Result Date: 03/11/2017 CLINICAL DATA:  37 year old male with altered mental status. EXAM: CT HEAD WITHOUT CONTRAST TECHNIQUE: Contiguous axial images were obtained from the base of the skull through the vertex without intravenous contrast. COMPARISON:  None. FINDINGS: Brain: No evidence of acute infarction, hemorrhage, hydrocephalus, extra-axial collection or mass lesion/mass effect. Vascular: No hyperdense vessel or unexpected calcification. Skull: Normal. Negative for fracture or focal lesion. Sinuses/Orbits: There is diffuse mucoperiosteal thickening of paranasal sinuses with near complete opacification of the left maxillary sinus. The mastoid air cells are clear. Other: There is moderate left temporal subcutaneous hematoma. Correlation with clinical exam recommended.  Small pockets of air in the right masticator space likely intravascular air and iatrogenic. IMPRESSION: 1. Normal noncontrast CT of the brain. 2. Moderate left temporal subcutaneous hematoma. 3. Paranasal sinus disease. Electronically Signed   By: Elgie Collard M.D.   On: 03/11/2017 00:29   US Venous Img Upper Uni Left  Result Date: 03/11/2017 CLINICAL DATA:  Left upper extremity swelling.  Evaluate for DVT. EXAM: LEFT UPPER EXTREMITY VENOUS DOPPLER ULTRASOUND TECHNIQUE: Gray-scale sonography with graded compression, as well as color Doppler and duplex ultrasound were performed to evaluate the upper extremity deep venous system from the level of the subclavian vein and including the jugular, axillary, basilic, radial, ulnar and upper cephalic vein. Spectral Doppler was utilized to evaluate flow at rest and with distal augmentation maneuvers. COMPARISON:  None. FINDINGS: Contralateral Subclavian Vein: Respiratory phasicity is normal and symmetric with the symptomatic side. No evidence of thrombus. Normal compressibility. Internal Jugular Vein: No evidence of thrombus. Normal compressibility, respiratory phasicity and response to augmentation. Subclavian Vein: There is hypoechoic occlusive thrombus involving the peripheral aspect of the left subclavian vein (image 9 and 10). The central aspect the left subclavian vein appears patent where imaged. Axillary Vein: There is hypoechoic occlusive thrombus within the left  axillary vein (image 13) extending to involve one of the paired brachial veins (images 25 - 27). Cephalic Vein: No evidence of thrombus. Normal compressibility, respiratory phasicity and response to augmentation. Basilic Vein: No evidence of thrombus. Normal compressibility, respiratory phasicity and response to augmentation. Brachial Veins: There is hypoechoic occlusive thrombus within 1 of the paired left brachial veins (images 25-27. The adjacent paired brachial vein appears widely patent. Radial  Veins: No evidence of thrombus. Normal compressibility, respiratory phasicity and response to augmentation. Ulnar Veins: No evidence of thrombus. Normal compressibility, respiratory phasicity and response to augmentation. Other Findings:  None visualized. IMPRESSION: The examination is positive for occlusive DVT extending from the peripheral aspect of the left subclavian vein to involve the left axillary and one of the paired left brachial veins. These results will be called to the ordering clinician or representative by the Radiologist Assistant, and communication documented in the PACS or zVision Dashboard. Electronically Signed   By: Simonne Come M.D.   On: 03/11/2017 13:16   Dg Chest Port 1 View  Result Date: 03/12/2017 CLINICAL DATA:  Hypoxia EXAM: PORTABLE CHEST 1 VIEW COMPARISON:  March 11, 2017 FINDINGS: Endotracheal tube tip is 4.2 cm above the carina. Central catheter tip is in the superior vena cava. No pneumothorax. There is no appreciable edema or consolidation. Heart is upper normal in size with pulmonary vascularity within normal limits. No adenopathy. No bone lesions. IMPRESSION: Tube and catheter positions as described without pneumothorax. No edema or consolidation. Stable cardiac silhouette. Electronically Signed   By: Bretta Bang III M.D.   On: 03/12/2017 07:09   Dg Chest Port 1 View  Result Date: 03/12/2017 CLINICAL DATA:  Central line placement EXAM: PORTABLE CHEST 1 VIEW COMPARISON:  Chest radiograph 03/11/2017 at 7:17 a.m. FINDINGS: Unchanged position of endotracheal tube with tip at the level of the clavicular heads. There is a right internal jugular vein approach central venous catheter with tip in the lower SVC. Shallow lung inflation without focal airspace consolidation or pulmonary edema. Supine radiography limits sensitivity for the detection of pneumothorax, but none is visualized. IMPRESSION: Right IJ central venous catheter with tip in the lower SVC. Electronically  Signed   By: Deatra Robinson M.D.   On: 03/12/2017 00:09   Dg Chest Port 1 View  Result Date: 03/11/2017 CLINICAL DATA:  Acute respiratory failure. EXAM: PORTABLE CHEST 1 VIEW COMPARISON:  Chest x-ray from yesterday. FINDINGS: Endotracheal tube remains in place with the tip approximately 2.3 cm above the level of the carina. Unchanged right internal jugular central venous catheter with the tip in the distal SVC. The cardiomediastinal silhouette is normal in size. Normal pulmonary vascularity. Low lung volumes with unchanged retrocardiac opacity at the left lung base. No pleural effusion or pneumothorax. No acute osseous abnormality. IMPRESSION: 1. Persistent low lung volumes with unchanged retrocardiac opacity, atelectasis versus infiltrate. Electronically Signed   By: Obie Dredge M.D.   On: 03/11/2017 08:34   Dg Chest Portable 1 View  Result Date: Apr 08, 2017 CLINICAL DATA:  Central line placement EXAM: PORTABLE CHEST 1 VIEW COMPARISON:  2013 hours FINDINGS: Low lung volumes with atelectasis at the lung bases. Retrocardiac consolidation either representing a small focus of pneumonia or atelectasis is identified at the left lung base. Endotracheal tube tip is 3.1 cm above the carina. Right IJ central line catheter is seen in the distal SVC. No pneumothorax is noted. Azygos fissure is of incidental note. No acute osseous abnormality. IMPRESSION: 1. New right IJ catheter with tip  in the distal SVC. No pneumothorax. 2. Endotracheal tube in satisfactory position. 3. Low lung volumes with atelectasis. Retrocardiac opacity is noted which likely represents atelectasis. A small focus of pneumonia is not entirely excluded. Electronically Signed   By: Tollie Eth M.D.   On: 03/12/2017 23:12   Dg Chest Portable 1 View  Result Date: 03/05/2017 CLINICAL DATA:  Intubation EXAM: PORTABLE CHEST 1 VIEW COMPARISON:  Portable exam 2013 hours compared to 01/08/2017 FINDINGS: Tip of endotracheal tube projects 4.0 cm above  carina. Upper normal heart size. Azygos fissure. Mediastinal contours and pulmonary vascularity normal. Low lung volumes with mild LEFT basilar atelectasis. Additional opacity in retrocardiac LEFT lower lobe question infiltrate versus atelectasis. Upper lungs clear. No pleural effusion or pneumothorax. IMPRESSION: LEFT basilar atelectasis with atelectasis versus consolidation in retrocardiac LEFT lower lobe. Satisfactory endotracheal tube position. Electronically Signed   By: Ulyses Southward M.D.   On: 03/04/2017 20:40   ASSESSMENT AND PLAN:   Bradford Cazier  is a 37 y.o. male with h/o Dm-2, Multiple sclerosis who presents with unresponsiveness and found to be in DKA.  Patient was last known well early in the morning when his mother left for work, though he had been vomiting and feeling bad the night before.  When she returned home from work in the afternoon she found him unresponsive on the bathroom floor  1.  Acute metabolic encephalopathy/unresponsiveness Multifactorial - -CT had positive for left subcutaneous hematoma patient was found down in the bathroom by mother -He is now intubated and on the ventilator -Neurology consultation appreciated -Patient started on broad-spectrum antibiotics along with IV acyclovir -EEG abnormal EEG secondary to general background slowing  2.  Sepsis--no source identified -Continue broad-spectrum antibiotic with IV vancomycin, cefepime and acyclovir -Follow blood culture negative - urine culture negative  -Patient on stress doses of steroids -continue  pressors vasopressin, Neo-Synephrine and levophed  3.  DKA -On IV insulin drip  4.  Left upper extremity DVT -Now started on heparin drip -Vascular consultation appreciated  5. Acute renal failure in the setting of Above  -Seen by nephrology.  Minimal urine output.   -Patient is started on CRRT  6.  Elevated troponin suspected secondary to sepsis and demand ischemia with multiorgan failure  7. Elevated  LFt's appears shock liver Cont supportive care  8.  DVT prophylaxis on heparin drip  Patient has overall a very poor prognosis.  He is critically ill with multiorgan failure -Pt is DNR.  Case discussed with Dr. Lonn Georgia CODE STATUS: DNR  DVT Prophylaxis: Heparin drip  TOTAL TIME TAKING CARE OF THIS PATIENT: 25 minutes.  >50% time spent on counselling and coordination of care  Note: This dictation was prepared with Dragon dictation along with smaller phrase technology. Any transcriptional errors that result from this process are unintentional.  Enedina Finner M.D on 03/12/2017 at 6:04 PM  Between 7am to 6pm - Pager - [phone]  After 6pm go to [email or web address] - password [email or web address]  [facility]  Office  [phone]  CC: Primary care physician; Patient, No Pcp PerPatient ID: Koda Routon, male   DOB: 15-Oct-1980, 37 y.o.   MRN: [id number]

## 2017-03-12 NOTE — Progress Notes (Signed)
[facility] - [place] [facility]   PATIENT NAME: Zachary Graves    MR#:  [id number]  DATE OF BIRTH:  08/04/1980  SUBJECTIVE:  Patient currently on levo fed and Neo-Synephrine drip Very restless.  Patient now intubated and on the ventilator  REVIEW OF SYSTEMS:   Review of Systems  Unable to perform ROS: Intubated   Tolerating Diet:npo Tolerating PT:  Intubated DRUG ALLERGIES:   Allergies  Allergen Reactions  . Naproxen   . Sulfa Antibiotics Hives    VITALS:  Blood pressure 92/66, pulse 70, temperature 97.7 F (36.5 C), resp. rate 18, height 5\' 9"  (1.753 m), weight (!) 157.7 kg (347 lb 10.7 oz), SpO2 95 %.  PHYSICAL EXAMINATION:   Physical Exam  GENERAL:  37 y.o.-year-old patient lying in the bed with no acute distress.  Morbidly obese critically ill eYES: Pupils equal, round, reactive to light and accommodation. No scleral icterus. .  Intubated HEENT: Head atraumatic, normocephalic. Oropharynx and nasopharynx clear.  NECK:  Supple, no jugular venous distention. No thyroid enlargement, no tenderness.  LUNGS: Normal breath sounds bilaterally, no wheezing, rales, rhonchi. No use of accessory muscles of respiration.  CARDIOVASCULAR: S1, S2 normal. No murmurs, rubs, or gallops.  ABDOMEN: Soft, nontender, nondistended. Bowel sounds present. No organomegaly or mass.  EXTREMITIES: Cold and cyanotic digits left upper extremity cold - both lower extremity cold nEUROLOGIC: Cranial nerves II through XII are intact. No focal Motor or sensory deficits b/l.   PSYCHIATRIC: Patient is intubated and on the ventilator SKIN: No obvious rash, lesion, or ulcer.   LABORATORY PANEL:  CBC Recent Labs  Lab 03/12/17 0533  WBC 13.1*  HGB 13.0  HCT 40.5  PLT 224    Chemistries  Recent Labs  Lab 03/12/17 1244 03/12/17 1446  NA 136 140  K 3.6 3.7  CL 100* 102  CO2 26 28  GLUCOSE 362* 279*  BUN 32* 33*  CREATININE 2.35* 2.52*  CALCIUM 6.3* 6.8*  MG 1.9  2.0  AST 2,115*  --   ALT 2,181*  --   ALKPHOS 110  --   BILITOT 0.6  --    Cardiac Enzymes Recent Labs  Lab 03/11/17 1241  TROPONINI 1.35*   RADIOLOGY:  Dg Ankle Complete Right  Result Date: 03/11/2017 CLINICAL DATA:  Osteomyelitis of the right ankle. EXAM: RIGHT ANKLE - COMPLETE 3+ VIEW COMPARISON:  12/07/2016 FINDINGS: Soft tissue swelling and possibly ulceration along the medial aspect of the right ankle. Periosteal thickening is identified along the distal diaphysis of the tibia and may reflect stigmata of chronic venous stasis or chronic osteomyelitis, favor venous stasis given mild-to-moderate soft tissue edema and vascular clips about the midfoot. No frank bone destruction, fracture or joint dislocation. Calcaneal enthesopathy is noted posteriorly. IMPRESSION: Periosteal new bone formation along the diaphysis of the tibia more likely to represent stigmata of chronic venous stasis. Chronic osteomyelitis could also have a similar appearance. No conclusive evidence for acute osteomyelitis or fracture. Electronically Signed   By: Tollie Eth M.D.   On: 03/11/2017 01:32   Dg Abd 1 View  Result Date: 03/12/2017 CLINICAL DATA:  OG tube placement. EXAM: ABDOMEN - 1 VIEW COMPARISON:  03/11/2017. FINDINGS: OG tube noted with tip below left hemidiaphragm. No bowel distention. IMPRESSION: OG tube noted with tip below left hemidiaphragm. Electronically Signed   By: Maisie Fus  Register   On: 03/12/2017 15:54   Dg Abd 1 View  Result Date: 03/11/2017 CLINICAL DATA:  Central line placement. EXAM:  ABDOMEN - 1 VIEW COMPARISON:  None. FINDINGS: Left femoral central line in place with tip projected over the left upper pelvis. IMPRESSION: Left femoral central line in place with tip projected over the left upper pelvis, tip at the level of the lower left sacrum. Electronically Signed   By: Bary Richard M.D.   On: 03/11/2017 21:26   Dg Abd 1 View  Result Date: 03/11/2017 CLINICAL DATA:  Second attempt and  orogastric tube placement. EXAM: ABDOMEN - 1 VIEW COMPARISON:  03/11/2017 at 0036 hours FINDINGS: The tip and side port of a gastric tube are visualized below the left hemidiaphragm. The side-port is just slightly caudal to the expected location of the GE junction. No free air or bowel dilatation is visualized. IMPRESSION: The tip and side port of a gastric tube are seen in the expected location of the stomach. The side port however is just slightly caudal to the level of the GE junction. Further advancement by 3-4 cm would help improve positioning. Electronically Signed   By: Tollie Eth M.D.   On: 03/11/2017 02:48   Dg Abd 1 View  Result Date: 03/11/2017 CLINICAL DATA:  Orogastric tube placement EXAM: ABDOMEN - 1 VIEW COMPARISON:  None. FINDINGS: The tip of the orogastric tube overlies the gastric body. The side port is just distal to the gastroesophageal junction. Advancement by 5-7 cm is recommended. Normal bowel gas pattern. IMPRESSION: Recommend advancement of the orogastric tube by 5-7 cm to ensure that the side port is within the stomach. Electronically Signed   By: Deatra Robinson M.D.   On: 03/11/2017 01:25   Ct Head Wo Contrast  Result Date: 03/11/2017 CLINICAL DATA:  37 year old male with altered mental status. EXAM: CT HEAD WITHOUT CONTRAST TECHNIQUE: Contiguous axial images were obtained from the base of the skull through the vertex without intravenous contrast. COMPARISON:  None. FINDINGS: Brain: No evidence of acute infarction, hemorrhage, hydrocephalus, extra-axial collection or mass lesion/mass effect. Vascular: No hyperdense vessel or unexpected calcification. Skull: Normal. Negative for fracture or focal lesion. Sinuses/Orbits: There is diffuse mucoperiosteal thickening of paranasal sinuses with near complete opacification of the left maxillary sinus. The mastoid air cells are clear. Other: There is moderate left temporal subcutaneous hematoma. Correlation with clinical exam recommended.  Small pockets of air in the right masticator space likely intravascular air and iatrogenic. IMPRESSION: 1. Normal noncontrast CT of the brain. 2. Moderate left temporal subcutaneous hematoma. 3. Paranasal sinus disease. Electronically Signed   By: Elgie Collard M.D.   On: 03/11/2017 00:29   US Venous Img Upper Uni Left  Result Date: 03/11/2017 CLINICAL DATA:  Left upper extremity swelling.  Evaluate for DVT. EXAM: LEFT UPPER EXTREMITY VENOUS DOPPLER ULTRASOUND TECHNIQUE: Gray-scale sonography with graded compression, as well as color Doppler and duplex ultrasound were performed to evaluate the upper extremity deep venous system from the level of the subclavian vein and including the jugular, axillary, basilic, radial, ulnar and upper cephalic vein. Spectral Doppler was utilized to evaluate flow at rest and with distal augmentation maneuvers. COMPARISON:  None. FINDINGS: Contralateral Subclavian Vein: Respiratory phasicity is normal and symmetric with the symptomatic side. No evidence of thrombus. Normal compressibility. Internal Jugular Vein: No evidence of thrombus. Normal compressibility, respiratory phasicity and response to augmentation. Subclavian Vein: There is hypoechoic occlusive thrombus involving the peripheral aspect of the left subclavian vein (image 9 and 10). The central aspect the left subclavian vein appears patent where imaged. Axillary Vein: There is hypoechoic occlusive thrombus within the left  axillary vein (image 13) extending to involve one of the paired brachial veins (images 25 - 27). Cephalic Vein: No evidence of thrombus. Normal compressibility, respiratory phasicity and response to augmentation. Basilic Vein: No evidence of thrombus. Normal compressibility, respiratory phasicity and response to augmentation. Brachial Veins: There is hypoechoic occlusive thrombus within 1 of the paired left brachial veins (images 25-27. The adjacent paired brachial vein appears widely patent. Radial  Veins: No evidence of thrombus. Normal compressibility, respiratory phasicity and response to augmentation. Ulnar Veins: No evidence of thrombus. Normal compressibility, respiratory phasicity and response to augmentation. Other Findings:  None visualized. IMPRESSION: The examination is positive for occlusive DVT extending from the peripheral aspect of the left subclavian vein to involve the left axillary and one of the paired left brachial veins. These results will be called to the ordering clinician or representative by the Radiologist Assistant, and communication documented in the PACS or zVision Dashboard. Electronically Signed   By: Simonne Come M.D.   On: 03/11/2017 13:16   Dg Chest Port 1 View  Result Date: 03/12/2017 CLINICAL DATA:  Hypoxia EXAM: PORTABLE CHEST 1 VIEW COMPARISON:  March 11, 2017 FINDINGS: Endotracheal tube tip is 4.2 cm above the carina. Central catheter tip is in the superior vena cava. No pneumothorax. There is no appreciable edema or consolidation. Heart is upper normal in size with pulmonary vascularity within normal limits. No adenopathy. No bone lesions. IMPRESSION: Tube and catheter positions as described without pneumothorax. No edema or consolidation. Stable cardiac silhouette. Electronically Signed   By: Bretta Bang III M.D.   On: 03/12/2017 07:09   Dg Chest Port 1 View  Result Date: 03/12/2017 CLINICAL DATA:  Central line placement EXAM: PORTABLE CHEST 1 VIEW COMPARISON:  Chest radiograph 03/11/2017 at 7:17 a.m. FINDINGS: Unchanged position of endotracheal tube with tip at the level of the clavicular heads. There is a right internal jugular vein approach central venous catheter with tip in the lower SVC. Shallow lung inflation without focal airspace consolidation or pulmonary edema. Supine radiography limits sensitivity for the detection of pneumothorax, but none is visualized. IMPRESSION: Right IJ central venous catheter with tip in the lower SVC. Electronically  Signed   By: Deatra Robinson M.D.   On: 03/12/2017 00:09   Dg Chest Port 1 View  Result Date: 03/11/2017 CLINICAL DATA:  Acute respiratory failure. EXAM: PORTABLE CHEST 1 VIEW COMPARISON:  Chest x-ray from yesterday. FINDINGS: Endotracheal tube remains in place with the tip approximately 2.3 cm above the level of the carina. Unchanged right internal jugular central venous catheter with the tip in the distal SVC. The cardiomediastinal silhouette is normal in size. Normal pulmonary vascularity. Low lung volumes with unchanged retrocardiac opacity at the left lung base. No pleural effusion or pneumothorax. No acute osseous abnormality. IMPRESSION: 1. Persistent low lung volumes with unchanged retrocardiac opacity, atelectasis versus infiltrate. Electronically Signed   By: Obie Dredge M.D.   On: 03/11/2017 08:34   Dg Chest Portable 1 View  Result Date: 03-15-17 CLINICAL DATA:  Central line placement EXAM: PORTABLE CHEST 1 VIEW COMPARISON:  2013 hours FINDINGS: Low lung volumes with atelectasis at the lung bases. Retrocardiac consolidation either representing a small focus of pneumonia or atelectasis is identified at the left lung base. Endotracheal tube tip is 3.1 cm above the carina. Right IJ central line catheter is seen in the distal SVC. No pneumothorax is noted. Azygos fissure is of incidental note. No acute osseous abnormality. IMPRESSION: 1. New right IJ catheter with tip  in the distal SVC. No pneumothorax. 2. Endotracheal tube in satisfactory position. 3. Low lung volumes with atelectasis. Retrocardiac opacity is noted which likely represents atelectasis. A small focus of pneumonia is not entirely excluded. Electronically Signed   By: Tollie Eth M.D.   On: 03-18-2017 23:12   Dg Chest Portable 1 View  Result Date: 2017-03-18 CLINICAL DATA:  Intubation EXAM: PORTABLE CHEST 1 VIEW COMPARISON:  Portable exam 2013 hours compared to 01/08/2017 FINDINGS: Tip of endotracheal tube projects 4.0 cm above  carina. Upper normal heart size. Azygos fissure. Mediastinal contours and pulmonary vascularity normal. Low lung volumes with mild LEFT basilar atelectasis. Additional opacity in retrocardiac LEFT lower lobe question infiltrate versus atelectasis. Upper lungs clear. No pleural effusion or pneumothorax. IMPRESSION: LEFT basilar atelectasis with atelectasis versus consolidation in retrocardiac LEFT lower lobe. Satisfactory endotracheal tube position. Electronically Signed   By: Ulyses Southward M.D.   On: March 18, 2017 20:40   ASSESSMENT AND PLAN:   Azad Calame  is a 37 y.o. male with h/o Dm-2, Multiple sclerosis who presents with unresponsiveness and found to be in DKA.  Patient was last known well early in the morning when his mother left for work, though he had been vomiting and feeling bad the night before.  When she returned home from work in the afternoon she found him unresponsive on the bathroom floor  1.  Acute metabolic encephalopathy/unresponsiveness Multifactorial - -CT had positive for left subcutaneous hematoma patient was found down in the bathroom by mother -He is now intubated and on the ventilator -Neurology consultation appreciated -Patient started on broad-spectrum antibiotics along with IV acyclovir -EEG abnormal EEG secondary to general background slowing  2.  Sepsis--no source identified -Continue broad-spectrum antibiotic with IV vancomycin, cefepime and acyclovir -Follow blood culture negative - urine culture negative  -Patient on stress doses of steroids -continue  pressors vasopressin, Neo-Synephrine and levophed  3.  DKA -On IV insulin drip  4.  Left upper extremity DVT -Now started on heparin drip -Vascular consultation appreciated  5. Acute renal failure in the setting of Above  -Seen by nephrology.  Minimal urine output.   -Patient is started on CRRT  6.  Elevated troponin suspected secondary to sepsis and demand ischemia with multiorgan failure  7.  DVT  prophylaxis on heparin drip  Patient has overall a very poor prognosis.  He is critically ill with multiorgan failure -Pt is DNR.  Case discussed with Dr. Lonn Georgia CODE STATUS: DNR  DVT Prophylaxis: Heparin drip  TOTAL TIME TAKING CARE OF THIS PATIENT: 25 minutes.  >50% time spent on counselling and coordination of care  Note: This dictation was prepared with Dragon dictation along with smaller phrase technology. Any transcriptional errors that result from this process are unintentional.  Enedina Finner M.D on 03/12/2017 at 5:54 PM  Between 7am to 6pm - Pager - [phone]  After 6pm go to [email or web address] - password [email or web address]  [facility]  Office  [phone]  CC: Primary care physician; Patient, No Pcp PerPatient ID: Lynkin Saini, male   DOB: Jan 17, 1981, 37 y.o.   MRN: [id number]

## 2017-03-12 NOTE — Progress Notes (Signed)
Pharmacy Electrolyte Monitoring Consult:  Pharmacy consulted to assist in monitoring and replacing electrolytes in this 37 y.o. male admitted on 03/19/17 with Loss of Consciousness Patient admitted with unresponsiveness and placed on ventilator, DKA, acute renal failure and possible sepsis.  Patient is currently on CRRT and bicarb drip.   Calcium of 6.3 that corrects to 8.1  Labs:  Sodium (mmol/L)  Date Value  03/12/2017 140   Potassium (mmol/L)  Date Value  03/12/2017 3.7   Magnesium (mg/dL)  Date Value  16/12/9602 2.0   Phosphorus (mg/dL)  Date Value  54/10/8117 4.2   Calcium (mg/dL)  Date Value  14/78/2956 6.8 (L)   Albumin (g/dL)  Date Value  21/30/8657 1.9 (L)    Plan: Will order Calcium Gluconate 1g IV x one. K is stable presently. Will continue to monitor.  Clovia Cuff, PharmD, BCPS 03/12/2017 5:21 PM

## 2017-03-12 NOTE — Progress Notes (Addendum)
ANTICOAGULATION CONSULT NOTE  Pharmacy Consult for Heparin Indication: upper extremity DVT  Allergies  Allergen Reactions  . Naproxen   . Sulfa Antibiotics Hives    Patient Measurements: Height: 5\' 9"  (175.3 cm) Weight: (!) 322 lb 1.5 oz (146.1 kg) IBW/kg (Calculated) : 70.7 Heparin Dosing Weight: 105 kg  Vital Signs: Temp: 98.2 F (36.8 C) (01/11 0100) Temp Source: Core (01/11 0100) BP: 87/58 (01/11 0000) Pulse Rate: 89 (01/11 0000)  Labs: Recent Labs    April 03, 2017 2012 03/11/17 0113 03/11/17 0428 03/11/17 0840 03/11/17 1115 03/11/17 1116 03/11/17 1241 03/11/17 1543 03/11/17 1928 03/12/17 0015  HGB 15.5  --  14.7  --   --   --   --   --   --   --   HCT 54.0*  --  46.6  --   --   --   --   --   --   --   PLT 379  --  355  --   --   --   --   --   --   --   APTT  --   --   --   --  27  --   --   --   --   --   LABPROT  --   --   --   --   --  16.5*  --   --   --   --   INR  --   --   --   --   --  1.34  --   --   --   --   HEPARINUNFRC  --   --   --   --   --   --   --   --   --  0.16*  CREATININE 3.09* 2.81* 2.69* 2.94*  --   --  2.96* 2.75* 2.62* 2.95*  CKTOTAL 38,049*  --   --   --   --   --   --  >50,000*  --   --   TROPONINI  --  0.67*  --  1.46*  --   --  1.35*  --   --   --     Estimated Creatinine Clearance: 49.4 mL/min (A) (by C-G formula based on SCr of 2.95 mg/dL (H)).   Medical History: Past Medical History:  Diagnosis Date  . Back pain   . Diabetes 1.5, managed as type 1 (HCC)   . Legg-Calve-Perthes disease   . Neuropathic pain   . Obesity   . Osteoarthritis     Assessment: 37 y/o M admitted with unresponsiveness and ventilated with DKA and found to have upper extremity DVT.   Goal of Therapy:  Heparin level 0.3-0.7 units/ml Monitor platelets by anticoagulation protocol: Yes   Plan:  Give 6000units bolus x 1 Start heparin infusion at 1700 units/hr Check anti-Xa level in 6 hours and daily while on heparin Continue to monitor H&H and  platelets  01/11 0000 heparin level 0.16. 3200 unit bolus and increase rate to 2100 units/hr. Recheck in 6 hours.  Winslow Verrill S 03/12/2017,1:09 AM

## 2017-03-12 NOTE — Progress Notes (Signed)
[facility] Progess Note    SYNOPSIS   37 yo male with a PMH of Osteoarthritis, Chronic Right Foot Ulceration (currently managed by wound clinic at [facility]), Obesity, Neuropathic Pain, Legg-Calve-Perthes Diseases, Type I DM, and Chronic Back Pain.  He presented to [facility] ER via EMS on 01/9 after being found unresponsive on the floor at home by his family.   ASSESSMENT/PLAN   Respiratory failure. Multifactorial etiology, multisystem organ failure secondary to overwhelming sepsis. Continue mechanical ventilation until hemodynamics in mental status improves. Is on pressure regulated volume control, stable arterial blood gas and chest x-ray.  Septic shock. Presently on vasopressin and norepinephrine, he is also on stress dose hydrocortisone. He is empirically being covered for CNS disease to include vancomycin, Rocephin, ampicillin, acyclovir. Unable to perform lumbar puncture secondary to the need for systemic anticoagulation secondary to DVT and arterial occlusion  Renal failure. Multiple etiologies include acute tubular necrosis, sepsis, pigment-induced nephropathy secondary to rhabdomyolysis, pending this morning CK, on bicarbonate infusion. Started on CRRT secondary to hemodynamics, had difficulty with dialysis catheter readjusted this morning now working well  DKA. Presently on insulin infusion, his anion gap has resolved, blood sugars in the 300s  Vascular occlusion. Vascular surgery has seen patient, feel only therapeutics that they can provide at this time is systemic anticoagulation. Appreciate their input  Venous thromboembolic disease. Left upper extremity DVT, on systemic heparin, will DC IV line  Transaminitis. Markedly elevated liver function tests, most likely represents shock liver with transaminitis, pending repeat LFTs this morning  Critical care time 45 minutes  VENTILATOR SETTINGS: Vent Mode: PRVC FiO2 (%):  [40 %] 40 % Set Rate:  [18 bmp] 18  bmp Vt Set:  [650 mL] 650 mL PEEP:  [5 cmH20] 5 cmH20  HEMODYNAMICS: CVP:  [10 mmHg-19 mmHg] 15 mmHg  INTAKE / OUTPUT:  Intake/Output Summary (Last 24 hours) at 03/12/2017 0919 Last data filed at 03/12/2017 0600 Gross per 24 hour  Intake 6870.88 ml  Output 750 ml  Net 6120.88 ml     Name: Zachary Graves MRN: [id number] DOB: Sep 02, 1980    ADMISSION DATE:  Apr 06, 2017  SUBJECTIVE:   Over the last 24 hours, patient has remained on pressors to include vasopressin and norepinephrine, CRRT was begun, he was seen by vascular surgery for ischemic left hand and started on heparin infusion. He was also seen by neurology, pending results of EEG, CT scan of head noted, lumbar puncture not performed secondary to the need for anticoagulation for his left hand. He remains on a bicarbonate infusion for rhabdomyolysis. Left upper extremity Doppler positive for DVT  VITAL SIGNS: Temp:  [97.4 F (36.3 C)-102.3 F (39.1 C)] 97.8 F (36.6 C) (01/11 0700) Pulse Rate:  [31-91] 73 (01/11 0830) Resp:  [15-21] 18 (01/11 0830) BP: (68-126)/(51-109) 103/70 (01/11 0830) SpO2:  [91 %-100 %] 95 % (01/11 0830) FiO2 (%):  [40 %] 40 % (01/11 0810) Weight:  [157.7 kg (347 lb 10.7 oz)] 157.7 kg (347 lb 10.7 oz) (01/11 0500)  PHYSICAL EXAMINATION: Physical Examination:   VS: BP 103/70   Pulse 73   Temp 97.8 F (36.6 C)   Resp 18   Ht 5\' 9"  (1.753 m)   Wt (!) 157.7 kg (347 lb 10.7 oz)   SpO2 95%   BMI 51.34 kg/m   General Appearance: Patient is unresponsive, on fentanyl, orally intubated, right internal jugular dialysis catheter, left femoral central line Neuro: Nurse relates that patient has had evidence of responding, breathes above  the ventilator, has a cough and a gag with pupillary response HEENT: Oral endotracheal tube noted, right internal jugular dialysis catheter, trachea is midline Pulmonary: Coarse rhonchi appreciated right greater than left Cardiovascular regular rate and rhythm Abdomen:  Soft exam, positive bowel sounds Extremities: Left hand still reveals absent distal radial pulse, left arm is swollen and hand is swollen with some mottling changes.    LABORATORY PANEL:   CBC Recent Labs  Lab 03/12/17 0533  WBC 13.1*  HGB 13.0  HCT 40.5  PLT 224    Chemistries  Recent Labs  Lab 03/20/2017 2012  03/11/17 0428  03/12/17 0711  NA 122*   < > 140   < > 138  K >7.5*   < > 4.7   < > 3.8  CL 84*   < > 105   < > 100*  CO2 18*   < > 19*   < > 26  GLUCOSE 1,184*   < > 702*   < > 316*  BUN 35*   < > 33*   < > 34*  CREATININE 3.09*   < > 2.69*   < > 2.53*  CALCIUM 8.5*   < > 7.7*   < > 6.6*  MG  --   --   --    < > 1.9  PHOS  --   --   --    < > 5.2*  AST 4,855*  --  >10,000*  --   --   ALT 2,250*  --  3,543*  --   --   ALKPHOS 279*  --   --   --   --   BILITOT 1.2  --   --   --   --    < > = values in this interval not displayed.    Recent Labs  Lab 03/12/17 0314 03/12/17 0416 03/12/17 0521 03/12/17 0626 03/12/17 0733 03/12/17 0837  GLUCAP 279* 255* 440* 310* 302* 300*   Recent Labs  Lab 03/11/17 0517 03/11/17 1630 03/12/17 0553  PHART 7.25* 7.33* 7.34*  PCO2ART 44 39 43  PO2ART 100 84 76*   Recent Labs  Lab 2017-03-20 2012 03/11/17 0428  03/12/17 0015 03/12/17 0321 03/12/17 0711  AST 4,855* >10,000*  --   --   --   --   ALT 2,250* 3,543*  --   --   --   --   ALKPHOS 279*  --   --   --   --   --   BILITOT 1.2  --   --   --   --   --   ALBUMIN 3.6  --    < > 2.4* 2.0* 2.0*   < > = values in this interval not displayed.    Cardiac Enzymes Recent Labs  Lab 03/11/17 1241  TROPONINI 1.35*    RADIOLOGY:  Dg Ankle Complete Right  Result Date: 03/11/2017 CLINICAL DATA:  Osteomyelitis of the right ankle. EXAM: RIGHT ANKLE - COMPLETE 3+ VIEW COMPARISON:  12/07/2016 FINDINGS: Soft tissue swelling and possibly ulceration along the medial aspect of the right ankle. Periosteal thickening is identified along the distal diaphysis of the tibia and  may reflect stigmata of chronic venous stasis or chronic osteomyelitis, favor venous stasis given mild-to-moderate soft tissue edema and vascular clips about the midfoot. No frank bone destruction, fracture or joint dislocation. Calcaneal enthesopathy is noted posteriorly. IMPRESSION: Periosteal new bone formation along the diaphysis of the tibia more likely to represent stigmata  of chronic venous stasis. Chronic osteomyelitis could also have a similar appearance. No conclusive evidence for acute osteomyelitis or fracture. Electronically Signed   By: Tollie Eth M.D.   On: 03/11/2017 01:32   Dg Abd 1 View  Result Date: 03/11/2017 CLINICAL DATA:  Central line placement. EXAM: ABDOMEN - 1 VIEW COMPARISON:  None. FINDINGS: Left femoral central line in place with tip projected over the left upper pelvis. IMPRESSION: Left femoral central line in place with tip projected over the left upper pelvis, tip at the level of the lower left sacrum. Electronically Signed   By: Bary Richard M.D.   On: 03/11/2017 21:26   Dg Abd 1 View  Result Date: 03/11/2017 CLINICAL DATA:  Second attempt and orogastric tube placement. EXAM: ABDOMEN - 1 VIEW COMPARISON:  03/11/2017 at 0036 hours FINDINGS: The tip and side port of a gastric tube are visualized below the left hemidiaphragm. The side-port is just slightly caudal to the expected location of the GE junction. No free air or bowel dilatation is visualized. IMPRESSION: The tip and side port of a gastric tube are seen in the expected location of the stomach. The side port however is just slightly caudal to the level of the GE junction. Further advancement by 3-4 cm would help improve positioning. Electronically Signed   By: Tollie Eth M.D.   On: 03/11/2017 02:48   Dg Abd 1 View  Result Date: 03/11/2017 CLINICAL DATA:  Orogastric tube placement EXAM: ABDOMEN - 1 VIEW COMPARISON:  None. FINDINGS: The tip of the orogastric tube overlies the gastric body. The side port is just  distal to the gastroesophageal junction. Advancement by 5-7 cm is recommended. Normal bowel gas pattern. IMPRESSION: Recommend advancement of the orogastric tube by 5-7 cm to ensure that the side port is within the stomach. Electronically Signed   By: Deatra Robinson M.D.   On: 03/11/2017 01:25   Ct Head Wo Contrast  Result Date: 03/11/2017 CLINICAL DATA:  37 year old male with altered mental status. EXAM: CT HEAD WITHOUT CONTRAST TECHNIQUE: Contiguous axial images were obtained from the base of the skull through the vertex without intravenous contrast. COMPARISON:  None. FINDINGS: Brain: No evidence of acute infarction, hemorrhage, hydrocephalus, extra-axial collection or mass lesion/mass effect. Vascular: No hyperdense vessel or unexpected calcification. Skull: Normal. Negative for fracture or focal lesion. Sinuses/Orbits: There is diffuse mucoperiosteal thickening of paranasal sinuses with near complete opacification of the left maxillary sinus. The mastoid air cells are clear. Other: There is moderate left temporal subcutaneous hematoma. Correlation with clinical exam recommended. Small pockets of air in the right masticator space likely intravascular air and iatrogenic. IMPRESSION: 1. Normal noncontrast CT of the brain. 2. Moderate left temporal subcutaneous hematoma. 3. Paranasal sinus disease. Electronically Signed   By: Elgie Collard M.D.   On: 03/11/2017 00:29   US Venous Img Upper Uni Left  Result Date: 03/11/2017 CLINICAL DATA:  Left upper extremity swelling.  Evaluate for DVT. EXAM: LEFT UPPER EXTREMITY VENOUS DOPPLER ULTRASOUND TECHNIQUE: Gray-scale sonography with graded compression, as well as color Doppler and duplex ultrasound were performed to evaluate the upper extremity deep venous system from the level of the subclavian vein and including the jugular, axillary, basilic, radial, ulnar and upper cephalic vein. Spectral Doppler was utilized to evaluate flow at rest and with distal  augmentation maneuvers. COMPARISON:  None. FINDINGS: Contralateral Subclavian Vein: Respiratory phasicity is normal and symmetric with the symptomatic side. No evidence of thrombus. Normal compressibility. Internal Jugular Vein: No evidence  of thrombus. Normal compressibility, respiratory phasicity and response to augmentation. Subclavian Vein: There is hypoechoic occlusive thrombus involving the peripheral aspect of the left subclavian vein (image 9 and 10). The central aspect the left subclavian vein appears patent where imaged. Axillary Vein: There is hypoechoic occlusive thrombus within the left axillary vein (image 13) extending to involve one of the paired brachial veins (images 25 - 27). Cephalic Vein: No evidence of thrombus. Normal compressibility, respiratory phasicity and response to augmentation. Basilic Vein: No evidence of thrombus. Normal compressibility, respiratory phasicity and response to augmentation. Brachial Veins: There is hypoechoic occlusive thrombus within 1 of the paired left brachial veins (images 25-27. The adjacent paired brachial vein appears widely patent. Radial Veins: No evidence of thrombus. Normal compressibility, respiratory phasicity and response to augmentation. Ulnar Veins: No evidence of thrombus. Normal compressibility, respiratory phasicity and response to augmentation. Other Findings:  None visualized. IMPRESSION: The examination is positive for occlusive DVT extending from the peripheral aspect of the left subclavian vein to involve the left axillary and one of the paired left brachial veins. These results will be called to the ordering clinician or representative by the Radiologist Assistant, and communication documented in the PACS or zVision Dashboard. Electronically Signed   By: Simonne Come M.D.   On: 03/11/2017 13:16   Dg Chest Port 1 View  Result Date: 03/12/2017 CLINICAL DATA:  Hypoxia EXAM: PORTABLE CHEST 1 VIEW COMPARISON:  March 11, 2017 FINDINGS:  Endotracheal tube tip is 4.2 cm above the carina. Central catheter tip is in the superior vena cava. No pneumothorax. There is no appreciable edema or consolidation. Heart is upper normal in size with pulmonary vascularity within normal limits. No adenopathy. No bone lesions. IMPRESSION: Tube and catheter positions as described without pneumothorax. No edema or consolidation. Stable cardiac silhouette. Electronically Signed   By: Bretta Bang III M.D.   On: 03/12/2017 07:09   Dg Chest Port 1 View  Result Date: 03/12/2017 CLINICAL DATA:  Central line placement EXAM: PORTABLE CHEST 1 VIEW COMPARISON:  Chest radiograph 03/11/2017 at 7:17 a.m. FINDINGS: Unchanged position of endotracheal tube with tip at the level of the clavicular heads. There is a right internal jugular vein approach central venous catheter with tip in the lower SVC. Shallow lung inflation without focal airspace consolidation or pulmonary edema. Supine radiography limits sensitivity for the detection of pneumothorax, but none is visualized. IMPRESSION: Right IJ central venous catheter with tip in the lower SVC. Electronically Signed   By: Deatra Robinson M.D.   On: 03/12/2017 00:09   Dg Chest Port 1 View  Result Date: 03/11/2017 CLINICAL DATA:  Acute respiratory failure. EXAM: PORTABLE CHEST 1 VIEW COMPARISON:  Chest x-ray from yesterday. FINDINGS: Endotracheal tube remains in place with the tip approximately 2.3 cm above the level of the carina. Unchanged right internal jugular central venous catheter with the tip in the distal SVC. The cardiomediastinal silhouette is normal in size. Normal pulmonary vascularity. Low lung volumes with unchanged retrocardiac opacity at the left lung base. No pleural effusion or pneumothorax. No acute osseous abnormality. IMPRESSION: 1. Persistent low lung volumes with unchanged retrocardiac opacity, atelectasis versus infiltrate. Electronically Signed   By: Obie Dredge M.D.   On: 03/11/2017 08:34   Dg  Chest Portable 1 View  Result Date: 03/25/2017 CLINICAL DATA:  Central line placement EXAM: PORTABLE CHEST 1 VIEW COMPARISON:  2013 hours FINDINGS: Low lung volumes with atelectasis at the lung bases. Retrocardiac consolidation either representing a small focus of pneumonia  or atelectasis is identified at the left lung base. Endotracheal tube tip is 3.1 cm above the carina. Right IJ central line catheter is seen in the distal SVC. No pneumothorax is noted. Azygos fissure is of incidental note. No acute osseous abnormality. IMPRESSION: 1. New right IJ catheter with tip in the distal SVC. No pneumothorax. 2. Endotracheal tube in satisfactory position. 3. Low lung volumes with atelectasis. Retrocardiac opacity is noted which likely represents atelectasis. A small focus of pneumonia is not entirely excluded. Electronically Signed   By: Tollie Eth M.D.   On: 03/26/17 23:12   Dg Chest Portable 1 View  Result Date: Mar 26, 2017 CLINICAL DATA:  Intubation EXAM: PORTABLE CHEST 1 VIEW COMPARISON:  Portable exam 2013 hours compared to 01/08/2017 FINDINGS: Tip of endotracheal tube projects 4.0 cm above carina. Upper normal heart size. Azygos fissure. Mediastinal contours and pulmonary vascularity normal. Low lung volumes with mild LEFT basilar atelectasis. Additional opacity in retrocardiac LEFT lower lobe question infiltrate versus atelectasis. Upper lungs clear. No pleural effusion or pneumothorax. IMPRESSION: LEFT basilar atelectasis with atelectasis versus consolidation in retrocardiac LEFT lower lobe. Satisfactory endotracheal tube position. Electronically Signed   By: Ulyses Southward M.D.   On: 03/26/17 20:40        --Wells Guiles, MD.  ICU Pager: [phone] [facility] Office Number: [phone]   03/12/2017

## 2017-03-12 NOTE — Progress Notes (Signed)
Subjective: Patient remains intubated and sedated.  Per nursing on last sedation vacation patient was following some commands.  Remains in broad spectrum antibiotics.    Objective: Current vital signs: BP 99/60   Pulse 77   Temp 97.8 F (36.6 C)   Resp 18   Ht 5' 9"  (1.753 m)   Wt (!) 157.7 kg (347 lb 10.7 oz)   SpO2 96%   BMI 51.34 kg/m  Vital signs in last 24 hours: Temp:  [97.4 F (36.3 C)-101 F (38.3 C)] 97.8 F (36.6 C) (01/11 0700) Pulse Rate:  [31-91] 77 (01/11 1000) Resp:  [15-21] 18 (01/11 1000) BP: (68-126)/(51-109) 99/60 (01/11 1000) SpO2:  [91 %-100 %] 96 % (01/11 1000) FiO2 (%):  [40 %] 40 % (01/11 0810) Weight:  [157.7 kg (347 lb 10.7 oz)] 157.7 kg (347 lb 10.7 oz) (01/11 0500)  Intake/Output from previous day: 01/10 0701 - 01/11 0700 In: 6870.9 [I.V.:5830.9; IV Piggyback:1040] Out: 750 [Urine:750] Intake/Output this shift: Total I/O In: -  Out: 70 [Urine:70] Nutritional status: Diet NPO time specified  Neurologic Exam: Mental Status: Patient does not respond to verbal stimuli.  Does not respond to deep sternal rub.  Does not follow commands.  No verbalizations noted.  Cranial Nerves: II: patient does not respond confrontation bilaterally, pupils right 3 mm, left 3 mm,and reactive bilaterally III,IV,VI: doll's response absent bilaterally. V,VII: corneal reflex reduced bilaterally  VIII: patient does not respond to verbal stimuli IX,X: gag reflex reduced, XI: trapezius strength unable to test bilaterally XII: tongue strength unable to test Motor: Extremities flaccid throughout.  No spontaneous movement noted.  No purposeful movements noted. Sensory: Does not respond to noxious stimuli in any extremity.   Lab Results: Basic Metabolic Panel: Recent Labs  Lab 03/11/17 1543 03/11/17 1928 03/12/17 0015 03/12/17 0321 03/12/17 0711  NA 139 140 141 137 138  K 3.4* 3.7 3.9 3.5 3.8  CL 105 106 102 101 100*  CO2 20* 22 26 24 26   GLUCOSE 403* 343*  336* 322* 316*  BUN 38* 38* 39* 35* 34*  CREATININE 2.75* 2.62* 2.95* 2.59* 2.53*  CALCIUM 6.3* 7.2* 7.4* 6.6* 6.6*  MG 2.1 2.1 2.2 1.8 1.9  PHOS 5.7* 6.6* 6.7* 5.2* 5.2*    Liver Function Tests: Recent Labs  Lab 03/12/2017 2012 03/11/17 0428 03/11/17 1543 03/11/17 1928 03/12/17 0015 03/12/17 0321 03/12/17 0711  AST 4,855* >10,000*  --   --   --   --   --   ALT 2,250* 3,543*  --   --   --   --   --   ALKPHOS 279*  --   --   --   --   --   --   BILITOT 1.2  --   --   --   --   --   --   PROT 8.3*  --   --   --   --   --   --   ALBUMIN 3.6  --  2.3* 2.3* 2.4* 2.0* 2.0*   Recent Labs  Lab 03/11/17 1543  LIPASE 1,539*  AMYLASE 789*   No results for input(s): AMMONIA in the last 168 hours.  CBC: Recent Labs  Lab 03/12/2017 2012 03/11/17 0428 03/12/17 0533  WBC 13.4* 14.4* 13.1*  NEUTROABS 11.5*  --  11.2*  HGB 15.5 14.7 13.0  HCT 54.0* 46.6 40.5  MCV 94.1 86.2 83.9  PLT 379 355 224    Cardiac Enzymes: Recent Labs  Lab 03/16/2017 2012  03/11/17 0113 03/11/17 0840 03/11/17 1241 03/11/17 1543 03/12/17 0321  CKTOTAL 38,049*  --   --   --  >50,000* >50,000*  TROPONINI  --  0.67* 1.46* 1.35*  --   --     Lipid Panel: No results for input(s): CHOL, TRIG, HDL, CHOLHDL, VLDL, LDLCALC in the last 168 hours.  CBG: Recent Labs  Lab 03/12/17 0521 03/12/17 0626 03/12/17 0733 03/12/17 0837 03/12/17 0943  GLUCAP 440* 310* 302* 300* 287*    Microbiology: Results for orders placed or performed during the hospital encounter of 03/02/2017  Urine culture     Status: None   Collection Time: 03/08/2017  8:12 PM  Result Value Ref Range Status   Specimen Description   Final    URINE, RANDOM Performed at [facility], [address] [place], [place] [address]    Special Requests   Final    NONE Performed at [facility], [address], [place], [place] [address]    Culture   Final    NO GROWTH Performed at [facility], [place] [address]  [place], [place] [address]    Report Status 03/12/2017 FINAL  Final  Blood Culture (routine x 2)     Status: None (Preliminary result)   Collection Time: 03/05/2017 11:28 PM  Result Value Ref Range Status   Specimen Description BLOOD LT [facility]  Final   Special Requests   Final    BOTTLES DRAWN AEROBIC AND ANAEROBIC Blood Culture adequate volume   Culture   Final    NO GROWTH 2 DAYS Performed at [facility], [address], [place], [place] [address]    Report Status PENDING  Incomplete  Blood Culture (routine x 2)     Status: None (Preliminary result)   Collection Time: 03/05/2017 11:28 PM  Result Value Ref Range Status   Specimen Description BLOOD RT [facility]  Final   Special Requests   Final    BOTTLES DRAWN AEROBIC AND ANAEROBIC Blood Culture adequate volume   Culture   Final    NO GROWTH 2 DAYS Performed at [facility], [address], [place], [place] [address]    Report Status PENDING  Incomplete  MRSA PCR Screening     Status: Abnormal   Collection Time: 03/11/17 12:20 AM  Result Value Ref Range Status   MRSA by PCR POSITIVE (A) NEGATIVE Final    Comment:        The GeneXpert MRSA Assay (FDA approved for NASAL specimens only), is one component of a comprehensive MRSA colonization surveillance program. It is not intended to diagnose MRSA infection nor to guide or monitor treatment for MRSA infections. RESULT CALLED TO, READ BACK BY AND VERIFIED WITH: BARBARA THAO ON 03/11/17 AT 0310 JAG Performed at [facility], [address] [place], [place] [address]   Aerobic/Anaerobic Culture (surgical/deep wound)     Status: None (Preliminary result)   Collection Time: 03/11/17 12:53 AM  Result Value Ref Range Status   Specimen Description   Final    ANKLE RIGHT ANKLE Performed at [facility], [address] [place],  [address]    Special Requests   Final    NONE Performed at [facility], [address] [place], [place]  [address]    Gram Stain   Final    RARE WBC PRESENT, PREDOMINANTLY PMN FEW GRAM POSITIVE RODS RARE GRAM POSITIVE COCCI IN PAIRS RARE YEAST Performed at [facility], [place]  [address] [place], [place] [address]    Culture PENDING  Incomplete   Report Status PENDING  Incomplete    Coagulation Studies: Recent Labs    03/11/17 1116  LABPROT 16.5*  INR 1.34    Imaging: Dg Ankle Complete Right  Result Date: 03/11/2017 CLINICAL DATA:  Osteomyelitis of the right ankle. EXAM: RIGHT ANKLE - COMPLETE 3+ VIEW COMPARISON:  12/07/2016 FINDINGS: Soft tissue swelling and possibly ulceration along the medial aspect of the right ankle. Periosteal thickening is identified along the distal diaphysis of the tibia and may reflect stigmata of chronic venous stasis or chronic osteomyelitis, favor venous stasis given mild-to-moderate soft tissue edema and vascular clips about the midfoot. No frank bone destruction, fracture or joint dislocation. Calcaneal enthesopathy is noted posteriorly. IMPRESSION: Periosteal new bone formation along the diaphysis of the tibia more likely to represent stigmata of chronic venous stasis. Chronic osteomyelitis could also have a similar appearance. No conclusive evidence for acute osteomyelitis or fracture. Electronically Signed   By: Ashley Royalty M.D.   On: 03/11/2017 01:32   Dg Abd 1 View  Result Date: 03/11/2017 CLINICAL DATA:  Central line placement. EXAM: ABDOMEN - 1 VIEW COMPARISON:  None. FINDINGS: Left femoral central line in place with tip projected over the left upper pelvis. IMPRESSION: Left femoral central line in place with tip projected over the left upper pelvis, tip at the level of the lower left sacrum. Electronically Signed   By: Franki Cabot M.D.   On: 03/11/2017 21:26   Dg Abd 1 View  Result Date: 03/11/2017 CLINICAL DATA:  Second attempt and orogastric tube placement. EXAM: ABDOMEN - 1 VIEW COMPARISON:  03/11/2017 at 0036 hours FINDINGS: The tip and side  port of a gastric tube are visualized below the left hemidiaphragm. The side-port is just slightly caudal to the expected location of the GE junction. No free air or bowel dilatation is visualized. IMPRESSION: The tip and side port of a gastric tube are seen in the expected location of the stomach. The side port however is just slightly caudal to the level of the GE junction. Further advancement by 3-4 cm would help improve positioning. Electronically Signed   By: Ashley Royalty M.D.   On: 03/11/2017 02:48   Dg Abd 1 View  Result Date: 03/11/2017 CLINICAL DATA:  Orogastric tube placement EXAM: ABDOMEN - 1 VIEW COMPARISON:  None. FINDINGS: The tip of the orogastric tube overlies the gastric body. The side port is just distal to the gastroesophageal junction. Advancement by 5-7 cm is recommended. Normal bowel gas pattern. IMPRESSION: Recommend advancement of the orogastric tube by 5-7 cm to ensure that the side port is within the stomach. Electronically Signed   By: Ulyses Jarred M.D.   On: 03/11/2017 01:25   Ct Head Wo Contrast  Result Date: 03/11/2017 CLINICAL DATA:  37 year old male with altered mental status. EXAM: CT HEAD WITHOUT CONTRAST TECHNIQUE: Contiguous axial images were obtained from the base of the skull through the vertex without intravenous contrast. COMPARISON:  None. FINDINGS: Brain: No evidence of acute infarction, hemorrhage, hydrocephalus, extra-axial collection or mass lesion/mass effect. Vascular: No hyperdense vessel or unexpected calcification. Skull: Normal. Negative for fracture or focal lesion. Sinuses/Orbits: There is diffuse mucoperiosteal thickening of paranasal sinuses with near complete opacification of the left maxillary sinus. The mastoid air cells are clear. Other: There is moderate left temporal subcutaneous hematoma. Correlation with clinical exam recommended. Small pockets of air in the right masticator space likely intravascular air and iatrogenic. IMPRESSION:  1. Normal  noncontrast CT of the brain. 2. Moderate left temporal subcutaneous hematoma. 3. Paranasal sinus disease. Electronically Signed   By: Anner Crete M.D.   On: 03/11/2017 00:29   US Venous Img Upper Uni Left  Result Date: 03/11/2017 CLINICAL DATA:  Left upper extremity swelling.  Evaluate for DVT. EXAM: LEFT UPPER EXTREMITY VENOUS DOPPLER ULTRASOUND TECHNIQUE: Gray-scale sonography with graded compression, as well as color Doppler and duplex ultrasound were performed to evaluate the upper extremity deep venous system from the level of the subclavian vein and including the jugular, axillary, basilic, radial, ulnar and upper cephalic vein. Spectral Doppler was utilized to evaluate flow at rest and with distal augmentation maneuvers. COMPARISON:  None. FINDINGS: Contralateral Subclavian Vein: Respiratory phasicity is normal and symmetric with the symptomatic side. No evidence of thrombus. Normal compressibility. Internal Jugular Vein: No evidence of thrombus. Normal compressibility, respiratory phasicity and response to augmentation. Subclavian Vein: There is hypoechoic occlusive thrombus involving the peripheral aspect of the left subclavian vein (image 9 and 10). The central aspect the left subclavian vein appears patent where imaged. Axillary Vein: There is hypoechoic occlusive thrombus within the left axillary vein (image 13) extending to involve one of the paired brachial veins (images 25 - 27). Cephalic Vein: No evidence of thrombus. Normal compressibility, respiratory phasicity and response to augmentation. Basilic Vein: No evidence of thrombus. Normal compressibility, respiratory phasicity and response to augmentation. Brachial Veins: There is hypoechoic occlusive thrombus within 1 of the paired left brachial veins (images 25-27. The adjacent paired brachial vein appears widely patent. Radial Veins: No evidence of thrombus. Normal compressibility, respiratory phasicity and response to augmentation. Ulnar  Veins: No evidence of thrombus. Normal compressibility, respiratory phasicity and response to augmentation. Other Findings:  None visualized. IMPRESSION: The examination is positive for occlusive DVT extending from the peripheral aspect of the left subclavian vein to involve the left axillary and one of the paired left brachial veins. These results will be called to the ordering clinician or representative by the Radiologist Assistant, and communication documented in the PACS or zVision Dashboard. Electronically Signed   By: Sandi Mariscal M.D.   On: 03/11/2017 13:16   Dg Chest Port 1 View  Result Date: 03/12/2017 CLINICAL DATA:  Hypoxia EXAM: PORTABLE CHEST 1 VIEW COMPARISON:  March 11, 2017 FINDINGS: Endotracheal tube tip is 4.2 cm above the carina. Central catheter tip is in the superior vena cava. No pneumothorax. There is no appreciable edema or consolidation. Heart is upper normal in size with pulmonary vascularity within normal limits. No adenopathy. No bone lesions. IMPRESSION: Tube and catheter positions as described without pneumothorax. No edema or consolidation. Stable cardiac silhouette. Electronically Signed   By: Lowella Grip III M.D.   On: 03/12/2017 07:09   Dg Chest Port 1 View  Result Date: 03/12/2017 CLINICAL DATA:  Central line placement EXAM: PORTABLE CHEST 1 VIEW COMPARISON:  Chest radiograph 03/11/2017 at 7:17 a.m. FINDINGS: Unchanged position of endotracheal tube with tip at the level of the clavicular heads. There is a right internal jugular vein approach central venous catheter with tip in the lower SVC. Shallow lung inflation without focal airspace consolidation or pulmonary edema. Supine radiography limits sensitivity for the detection of pneumothorax, but none is visualized. IMPRESSION: Right IJ central venous catheter with tip in the lower SVC. Electronically Signed   By: Ulyses Jarred M.D.   On: 03/12/2017 00:09   Dg Chest Port 1 View  Result Date: 03/11/2017 CLINICAL  DATA:  Acute respiratory  failure. EXAM: PORTABLE CHEST 1 VIEW COMPARISON:  Chest x-ray from yesterday. FINDINGS: Endotracheal tube remains in place with the tip approximately 2.3 cm above the level of the carina. Unchanged right internal jugular central venous catheter with the tip in the distal SVC. The cardiomediastinal silhouette is normal in size. Normal pulmonary vascularity. Low lung volumes with unchanged retrocardiac opacity at the left lung base. No pleural effusion or pneumothorax. No acute osseous abnormality. IMPRESSION: 1. Persistent low lung volumes with unchanged retrocardiac opacity, atelectasis versus infiltrate. Electronically Signed   By: Titus Dubin M.D.   On: 03/11/2017 08:34   Dg Chest Portable 1 View  Result Date: 03/27/2017 CLINICAL DATA:  Central line placement EXAM: PORTABLE CHEST 1 VIEW COMPARISON:  2013 hours FINDINGS: Low lung volumes with atelectasis at the lung bases. Retrocardiac consolidation either representing a small focus of pneumonia or atelectasis is identified at the left lung base. Endotracheal tube tip is 3.1 cm above the carina. Right IJ central line catheter is seen in the distal SVC. No pneumothorax is noted. Azygos fissure is of incidental note. No acute osseous abnormality. IMPRESSION: 1. New right IJ catheter with tip in the distal SVC. No pneumothorax. 2. Endotracheal tube in satisfactory position. 3. Low lung volumes with atelectasis. Retrocardiac opacity is noted which likely represents atelectasis. A small focus of pneumonia is not entirely excluded. Electronically Signed   By: Ashley Royalty M.D.   On: 03/12/2017 23:12   Dg Chest Portable 1 View  Result Date: 03/27/2017 CLINICAL DATA:  Intubation EXAM: PORTABLE CHEST 1 VIEW COMPARISON:  Portable exam 2013 hours compared to 01/08/2017 FINDINGS: Tip of endotracheal tube projects 4.0 cm above carina. Upper normal heart size. Azygos fissure. Mediastinal contours and pulmonary vascularity normal. Low lung  volumes with mild LEFT basilar atelectasis. Additional opacity in retrocardiac LEFT lower lobe question infiltrate versus atelectasis. Upper lungs clear. No pleural effusion or pneumothorax. IMPRESSION: LEFT basilar atelectasis with atelectasis versus consolidation in retrocardiac LEFT lower lobe. Satisfactory endotracheal tube position. Electronically Signed   By: Lavonia Dana M.D.   On: 03/17/2017 20:40    Medications:  I have reviewed the patient's current medications. Scheduled: . alteplase  2 mg Intracatheter Once  . alteplase  2 mg Intracatheter Once  . chlorhexidine gluconate (MEDLINE KIT)  15 mL Mouth Rinse BID  . Chlorhexidine Gluconate Cloth  6 each Topical Q0600  . hydrocortisone sod succinate (SOLU-CORTEF) inj  50 mg Intravenous Q6H  . mouth rinse  15 mL Mouth Rinse 10 times per day  . mupirocin ointment  1 application Nasal BID    Assessment/Plan: Patient intubated and sedated.  Has multisystem failure.  On broad-spectrum antibiotics due to inability to determine source at this time.  Patient also on heparin for left upper extremity.  EEG performed on yesterday shows no evidence of epileptiform activity.  No further multifocal myoclonus noted on examination at this time.  Initial head CT was unremarkable.  Agree with current management.  Will follow prn.     LOS: 2 days   Alexis Goodell, MD Neurology [phone] 03/12/2017  10:33 AM

## 2017-03-12 NOTE — Progress Notes (Signed)
Per Dr Lonn Georgia spoke with Pharmacy during rounds regarding adding dextrose to sodium bicarb instead of switching or adding d5 1/2 ns once patients blood sugar drops below 250.

## 2017-03-12 NOTE — Progress Notes (Signed)
[facility]  Daily Progress Note   Subjective: Patient remains intubated and sedated on vent.  Mother at bedside.  Objective: Vitals:   03/12/17 1200 03/12/17 1230 03/12/17 1300 03/12/17 1330  BP: (!) 89/65 105/69 90/63 94/66   Pulse: 75 74 74   Resp: 18 18 18 18   Temp:      TempSrc:      SpO2: 96% 97% 94%   Weight:      Height:        Intake/Output Summary (Last 24 hours) at 03/12/2017 1355 Last data filed at 03/12/2017 1300 Gross per 24 hour  Intake 6870.88 ml  Output 695 ml  Net 6175.88 ml   Physical Exam: Sedated on vent CV: RRR Pulmonary: Vented. CTA Bilaterally Abdomen: Soft, Nontender, Nondistended Vascular:  Left upper extremity: No palpable radial pulse.  Purple discoloration to fingers and hand.  Left hand is cool.   Laboratory: CBC    Component Value Date/Time   WBC 13.1 (H) 03/12/2017 0533   HGB 13.0 03/12/2017 0533   HCT 40.5 03/12/2017 0533   PLT 224 03/12/2017 0533   BMET    Component Value Date/Time   NA 136 03/12/2017 1244   K 3.6 03/12/2017 1244   CL 100 (L) 03/12/2017 1244   CO2 26 03/12/2017 1244   GLUCOSE 362 (H) 03/12/2017 1244   BUN 32 (H) 03/12/2017 1244   CREATININE 2.35 (H) 03/12/2017 1244   CALCIUM 6.3 (LL) 03/12/2017 1244   GFRNONAA 34 (L) 03/12/2017 1244   GFRAA 39 (L) 03/12/2017 1244   Assessment/Planning: The patient is a 37 year old male found down due to DKA and now in multisystem organ failure on vent - critical 1) Patient continues to be hemodynamically unstable and on pressure / vent support. He is not a candidate for any surgical or interventional therapy at this time.  If the patient's condition improves, a left upper extremity angiogram would be possible. 2) Heparin has been started. 3) Finger / toe tissue loss is highly possible given the patient's current situation. 4) At this time, the vascular surgery service will sign off.  If the patient's condition improves please reconsult.  Discussed  with Dr. Wallis Mart Chrishelle Zito PA-C 03/12/2017 1:55 PM

## 2017-03-12 NOTE — Progress Notes (Signed)
Inpatient Diabetes Program Recommendations  AACE/ADA: New Consensus Statement on Inpatient Glycemic Control (2015)  Target Ranges:  Prepandial:   less than 140 mg/dL      Peak postprandial:   less than 180 mg/dL (1-2 hours)      Critically ill patients:  140 - 180 mg/dL   Lab Results  Component Value Date   GLUCAP 310 (H) 03/12/2017   HGBA1C 15.0 (H) 03/11/2017    Review of Glycemic Control  Results for NICKALOUS, BARBA (MRN [id number]) as of 03/12/2017 07:59  Ref. Range 03/12/2017 02:08 03/12/2017 03:14 03/12/2017 04:16 03/12/2017 05:21 03/12/2017 06:26  Glucose-Capillary Latest Ref Range: 65 - 99 mg/dL 791 (H) 505 (H) 697 (H) 440 (H) 310 (H)    Diabetes history: Type 2 from Care Everywhere dated 03/24/16, Type 1 dated 02/05/2015  Outpatient Diabetes medications: none listed on medication record (historical note dated 02/05/15- Glucotrol, Glucophage, NPH 70/30 45 units bid)  Current orders for Inpatient glycemic control: IV insulin- Phase 1 DKA order set  Inpatient Diabetes Program Recommendations: Continue DKA phase 1 order set. CBG improving.  Susette Racer, RN, BA, MHA, CDE Diabetes Coordinator Inpatient Diabetes Program  [phone] (Team Pager) [phone] [facility] Office) 03/12/2017 7:59 AM

## 2017-03-12 NOTE — Progress Notes (Signed)
ANTICOAGULATION CONSULT NOTE  Pharmacy Consult for Heparin Indication: upper extremity DVT  Allergies  Allergen Reactions  . Naproxen   . Sulfa Antibiotics Hives    Patient Measurements: Height: 5\' 9"  (175.3 cm) Weight: (!) 347 lb 10.7 oz (157.7 kg) IBW/kg (Calculated) : 70.7 Heparin Dosing Weight: 105 kg  Vital Signs: Temp: 97.7 F (36.5 C) (01/11 1630) BP: 103/90 (01/11 1630) Pulse Rate: 69 (01/11 1630)  Labs: Recent Labs    04/06/17 2012 03/11/17 0113 03/11/17 0428 03/11/17 0840 03/11/17 1115 03/11/17 1116 03/11/17 1241 03/11/17 1543  03/12/17 0015 03/12/17 0321 03/12/17 0533 03/12/17 0711 03/12/17 0739 03/12/17 1244 03/12/17 1446  HGB 15.5  --  14.7  --   --   --   --   --   --   --   --  13.0  --   --   --   --   HCT 54.0*  --  46.6  --   --   --   --   --   --   --   --  40.5  --   --   --   --   PLT 379  --  355  --   --   --   --   --   --   --   --  224  --   --   --   --   APTT  --   --   --   --  27  --   --   --   --   --   --  38*  --   --   --   --   LABPROT  --   --   --   --   --  16.5*  --   --   --   --   --   --   --   --   --   --   INR  --   --   --   --   --  1.34  --   --   --   --   --   --   --   --   --   --   HEPARINUNFRC  --   --   --   --   --   --   --   --   --  0.16*  --   --   --  0.19*  --   --   CREATININE 3.09* 2.81* 2.69* 2.94*  --   --  2.96* 2.75*   < > 2.95* 2.59*  --  2.53*  --  2.35* 2.52*  CKTOTAL 38,049*  --   --   --   --   --   --  >50,000*  --   --  >50,000*  --   --   --   --   --   TROPONINI  --  0.67*  --  1.46*  --   --  1.35*  --   --   --   --   --   --   --   --   --    < > = values in this interval not displayed.    Estimated Creatinine Clearance: 60.5 mL/min (A) (by C-G formula based on SCr of 2.52 mg/dL (H)).   Medical History: Past Medical History:  Diagnosis Date  . Back pain   . Diabetes 1.5, managed as type 1 (  HCC)   . Legg-Calve-Perthes disease   . Neuropathic pain   . Obesity   .  Osteoarthritis     Assessment: 37 y/o M admitted with unresponsiveness and ventilated with DKA and found to have upper extremity DVT.   1/11 Heparin level resulted at 0.19  Goal of Therapy:  Heparin level 0.3-0.7 units/ml Monitor platelets by anticoagulation protocol: Yes   Plan:  Will order Heparin bolus of 3000 units and increase drip rate to 2400 units/hr. Recheck Heparin level in 6 hours.  Clovia Cuff, PharmD, BCPS 03/12/2017 5:16 PM

## 2017-03-13 LAB — RENAL FUNCTION PANEL
ALBUMIN: 2.2 g/dL — AB (ref 3.5–5.0)
ALBUMIN: 1.9 g/dL — AB (ref 3.5–5.0)
ANION GAP: 11 (ref 5–15)
Albumin: 1.8 g/dL — ABNORMAL LOW (ref 3.5–5.0)
Albumin: 1.9 g/dL — ABNORMAL LOW (ref 3.5–5.0)
Albumin: 1.9 g/dL — ABNORMAL LOW (ref 3.5–5.0)
Albumin: 1.9 g/dL — ABNORMAL LOW (ref 3.5–5.0)
Albumin: 2 g/dL — ABNORMAL LOW (ref 3.5–5.0)
Anion gap: 10 (ref 5–15)
Anion gap: 10 (ref 5–15)
Anion gap: 12 (ref 5–15)
Anion gap: 12 (ref 5–15)
Anion gap: 14 (ref 5–15)
Anion gap: 8 (ref 5–15)
BUN: 26 mg/dL — AB (ref 6–20)
BUN: 26 mg/dL — ABNORMAL HIGH (ref 6–20)
BUN: 26 mg/dL — ABNORMAL HIGH (ref 6–20)
BUN: 27 mg/dL — ABNORMAL HIGH (ref 6–20)
BUN: 28 mg/dL — ABNORMAL HIGH (ref 6–20)
BUN: 31 mg/dL — AB (ref 6–20)
BUN: 32 mg/dL — ABNORMAL HIGH (ref 6–20)
CALCIUM: 7.3 mg/dL — AB (ref 8.9–10.3)
CALCIUM: 6.9 mg/dL — AB (ref 8.9–10.3)
CHLORIDE: 101 mmol/L (ref 101–111)
CHLORIDE: 103 mmol/L (ref 101–111)
CO2: 24 mmol/L (ref 22–32)
CO2: 24 mmol/L (ref 22–32)
CO2: 24 mmol/L (ref 22–32)
CO2: 25 mmol/L (ref 22–32)
CO2: 26 mmol/L (ref 22–32)
CO2: 27 mmol/L (ref 22–32)
CO2: 28 mmol/L (ref 22–32)
CREATININE: 2.5 mg/dL — AB (ref 0.61–1.24)
CREATININE: 2.55 mg/dL — AB (ref 0.61–1.24)
CREATININE: 2.56 mg/dL — AB (ref 0.61–1.24)
CREATININE: 2.58 mg/dL — AB (ref 0.61–1.24)
Calcium: 7 mg/dL — ABNORMAL LOW (ref 8.9–10.3)
Calcium: 7 mg/dL — ABNORMAL LOW (ref 8.9–10.3)
Calcium: 7.1 mg/dL — ABNORMAL LOW (ref 8.9–10.3)
Calcium: 7.2 mg/dL — ABNORMAL LOW (ref 8.9–10.3)
Calcium: 7.2 mg/dL — ABNORMAL LOW (ref 8.9–10.3)
Chloride: 102 mmol/L (ref 101–111)
Chloride: 101 mmol/L (ref 101–111)
Chloride: 102 mmol/L (ref 101–111)
Chloride: 101 mmol/L (ref 101–111)
Chloride: 99 mmol/L — ABNORMAL LOW (ref 101–111)
Creatinine, Ser: 2.5 mg/dL — ABNORMAL HIGH (ref 0.61–1.24)
Creatinine, Ser: 2.59 mg/dL — ABNORMAL HIGH (ref 0.61–1.24)
Creatinine, Ser: 2.45 mg/dL — ABNORMAL HIGH (ref 0.61–1.24)
GFR calc Af Amer: 35 mL/min — ABNORMAL LOW (ref >60)
GFR calc Af Amer: 35 mL/min — ABNORMAL LOW (ref >60)
GFR calc Af Amer: 35 mL/min — ABNORMAL LOW (ref >60)
GFR calc Af Amer: 36 mL/min — ABNORMAL LOW (ref >60)
GFR calc Af Amer: 36 mL/min — ABNORMAL LOW (ref >60)
GFR calc Af Amer: 37 mL/min — ABNORMAL LOW (ref >60)
GFR calc non Af Amer: 30 mL/min — ABNORMAL LOW (ref >60)
GFR calc non Af Amer: 31 mL/min — ABNORMAL LOW (ref >60)
GFR calc non Af Amer: 31 mL/min — ABNORMAL LOW (ref >60)
GFR calc non Af Amer: 32 mL/min — ABNORMAL LOW (ref >60)
GFR, EST AFRICAN AMERICAN: 36 mL/min — AB (ref >60)
GFR, EST NON AFRICAN AMERICAN: 31 mL/min — AB (ref >60)
GFR, EST NON AFRICAN AMERICAN: 30 mL/min — AB (ref >60)
GFR, EST NON AFRICAN AMERICAN: 31 mL/min — AB (ref >60)
GLUCOSE: 204 mg/dL — AB (ref 65–99)
GLUCOSE: 190 mg/dL — AB (ref 65–99)
GLUCOSE: 208 mg/dL — AB (ref 65–99)
Glucose, Bld: 154 mg/dL — ABNORMAL HIGH (ref 65–99)
Glucose, Bld: 161 mg/dL — ABNORMAL HIGH (ref 65–99)
Glucose, Bld: 184 mg/dL — ABNORMAL HIGH (ref 65–99)
Glucose, Bld: 243 mg/dL — ABNORMAL HIGH (ref 65–99)
PHOSPHORUS: 3.3 mg/dL (ref 2.5–4.6)
PHOSPHORUS: 3.6 mg/dL (ref 2.5–4.6)
POTASSIUM: 3.7 mmol/L (ref 3.5–5.1)
POTASSIUM: 3.7 mmol/L (ref 3.5–5.1)
POTASSIUM: 3.8 mmol/L (ref 3.5–5.1)
POTASSIUM: 4 mmol/L (ref 3.5–5.1)
POTASSIUM: 4.2 mmol/L (ref 3.5–5.1)
Phosphorus: 3.3 mg/dL (ref 2.5–4.6)
Phosphorus: 3.4 mg/dL (ref 2.5–4.6)
Phosphorus: 3.4 mg/dL (ref 2.5–4.6)
Phosphorus: 3.6 mg/dL (ref 2.5–4.6)
Phosphorus: 4.1 mg/dL (ref 2.5–4.6)
Potassium: 4 mmol/L (ref 3.5–5.1)
Potassium: 3.7 mmol/L (ref 3.5–5.1)
SODIUM: 140 mmol/L (ref 135–145)
SODIUM: 139 mmol/L (ref 135–145)
SODIUM: 137 mmol/L (ref 135–145)
Sodium: 136 mmol/L (ref 135–145)
Sodium: 137 mmol/L (ref 135–145)
Sodium: 137 mmol/L (ref 135–145)
Sodium: 138 mmol/L (ref 135–145)

## 2017-03-13 LAB — CBC
HCT: 42.3 % (ref 40.0–52.0)
HEMOGLOBIN: 13.9 g/dL (ref 13.0–18.0)
MCH: 27.3 pg (ref 26.0–34.0)
MCHC: 32.8 g/dL (ref 32.0–36.0)
MCV: 83.2 fL (ref 80.0–100.0)
PLATELETS: 226 10*3/uL (ref 150–440)
RBC: 5.08 MIL/uL (ref 4.40–5.90)
RDW: 14.9 % — ABNORMAL HIGH (ref 11.5–14.5)
WBC: 18 10*3/uL — AB (ref 3.8–10.6)

## 2017-03-13 LAB — GLUCOSE, CAPILLARY
GLUCOSE-CAPILLARY: 190 mg/dL — AB (ref 65–99)
GLUCOSE-CAPILLARY: 178 mg/dL — AB (ref 65–99)
GLUCOSE-CAPILLARY: 181 mg/dL — AB (ref 65–99)
GLUCOSE-CAPILLARY: 198 mg/dL — AB (ref 65–99)
GLUCOSE-CAPILLARY: 169 mg/dL — AB (ref 65–99)
GLUCOSE-CAPILLARY: 180 mg/dL — AB (ref 65–99)
GLUCOSE-CAPILLARY: 175 mg/dL — AB (ref 65–99)
GLUCOSE-CAPILLARY: 163 mg/dL — AB (ref 65–99)
GLUCOSE-CAPILLARY: 149 mg/dL — AB (ref 65–99)
GLUCOSE-CAPILLARY: 143 mg/dL — AB (ref 65–99)
GLUCOSE-CAPILLARY: 114 mg/dL — AB (ref 65–99)
GLUCOSE-CAPILLARY: 137 mg/dL — AB (ref 65–99)
Glucose-Capillary: 138 mg/dL — ABNORMAL HIGH (ref 65–99)
Glucose-Capillary: 144 mg/dL — ABNORMAL HIGH (ref 65–99)
Glucose-Capillary: 146 mg/dL — ABNORMAL HIGH (ref 65–99)
Glucose-Capillary: 148 mg/dL — ABNORMAL HIGH (ref 65–99)
Glucose-Capillary: 162 mg/dL — ABNORMAL HIGH (ref 65–99)
Glucose-Capillary: 176 mg/dL — ABNORMAL HIGH (ref 65–99)
Glucose-Capillary: 176 mg/dL — ABNORMAL HIGH (ref 65–99)
Glucose-Capillary: 212 mg/dL — ABNORMAL HIGH (ref 65–99)
Glucose-Capillary: 215 mg/dL — ABNORMAL HIGH (ref 65–99)
Glucose-Capillary: 97 mg/dL (ref 65–99)

## 2017-03-13 LAB — MAGNESIUM
MAGNESIUM: 1.8 mg/dL (ref 1.7–2.4)
Magnesium: 1.9 mg/dL (ref 1.7–2.4)
Magnesium: 2 mg/dL (ref 1.7–2.4)
Magnesium: 1.7 mg/dL (ref 1.7–2.4)
Magnesium: 2.2 mg/dL (ref 1.7–2.4)
Magnesium: 2 mg/dL (ref 1.7–2.4)
Magnesium: 2 mg/dL (ref 1.7–2.4)

## 2017-03-13 LAB — BLOOD GAS, ARTERIAL
ACID-BASE DEFICIT: 2.6 mmol/L — AB (ref 0.0–2.0)
BICARBONATE: 23.2 mmol/L (ref 20.0–28.0)
FIO2: 0.4
MECHVT: 650 mL
O2 SAT: 94.2 %
PATIENT TEMPERATURE: 37
PCO2 ART: 43 mmHg (ref 32.0–48.0)
PEEP/CPAP: 5 cmH2O
PH ART: 7.34 — AB (ref 7.350–7.450)
PO2 ART: 76 mmHg — AB (ref 83.0–108.0)
RATE: 18 resp/min

## 2017-03-13 LAB — PROTIME-INR
INR: 1.22
Prothrombin Time: 15.3 seconds — ABNORMAL HIGH (ref 11.4–15.2)

## 2017-03-13 LAB — HEPARIN LEVEL (UNFRACTIONATED): HEPARIN UNFRACTIONATED: 0.43 [IU]/mL (ref 0.30–0.70)

## 2017-03-13 LAB — CK
CK TOTAL: 11678 U/L — AB (ref 49–397)
Total CK: 19835 U/L — ABNORMAL HIGH (ref 49–397)

## 2017-03-13 LAB — APTT: APTT: 43 s — AB (ref 24–36)

## 2017-03-13 MED ORDER — VECURONIUM BROMIDE 10 MG IV SOLR
10.0000 mg | Freq: Once | INTRAVENOUS | Status: AC
  Administered 2017-03-13: 10 mg via INTRAVENOUS
  Filled 2017-03-13: qty 10

## 2017-03-13 MED ORDER — SODIUM CHLORIDE 0.9 % IV SOLN
0.0000 mg/h | INTRAVENOUS | Status: DC
  Administered 2017-03-13 (×2): 4 mg/h via INTRAVENOUS
  Administered 2017-03-13: 2 mg/h via INTRAVENOUS
  Administered 2017-03-13: 4 mg/h via INTRAVENOUS
  Filled 2017-03-13 (×4): qty 10

## 2017-03-13 MED ORDER — SODIUM CHLORIDE 0.9 % IV SOLN
500.0000 mg | Freq: Two times a day (BID) | INTRAVENOUS | Status: DC
  Administered 2017-03-14 – 2017-03-17 (×7): 500 mg via INTRAVENOUS
  Filled 2017-03-13 (×9): qty 5

## 2017-03-13 MED ORDER — SODIUM CHLORIDE 0.9 % IV SOLN
500.0000 mg | INTRAVENOUS | Status: DC
  Administered 2017-03-13: 500 mg via INTRAVENOUS
  Filled 2017-03-13: qty 5

## 2017-03-13 MED ORDER — MIDAZOLAM BOLUS VIA INFUSION
1.0000 mg | INTRAVENOUS | Status: DC | PRN
  Administered 2017-03-13: 2 mg via INTRAVENOUS
  Filled 2017-03-13: qty 4

## 2017-03-13 MED ORDER — MAGNESIUM SULFATE 2 GM/50ML IV SOLN
2.0000 g | Freq: Once | INTRAVENOUS | Status: AC
  Administered 2017-03-13: 2 g via INTRAVENOUS
  Filled 2017-03-13: qty 50

## 2017-03-13 MED ORDER — SODIUM CHLORIDE 0.9 % IV SOLN
500.0000 mg | Freq: Every day | INTRAVENOUS | Status: DC
  Filled 2017-03-13: qty 5

## 2017-03-13 NOTE — Progress Notes (Signed)
[facility] Critical Care Medicine Progess Note    SYNOPSIS   37 yo male with a PMH of Osteoarthritis, Chronic Right Foot Ulceration (currently managed by wound clinic at [facility]), Obesity, Neuropathic Pain, Legg-Calve-Perthes Diseases, Type I DM, and Chronic Back Pain.  He presented to [facility] via EMS on 01/9 after being found unresponsive on the floor at home by his family.   ASSESSMENT/PLAN   Respiratory failure. Multifactorial etiology, multisystem organ failure secondary to overwhelming sepsis. Continue mechanical ventilation until hemodynamics in mental status improves. Is on pressure regulated volume control, stable arterial blood gas and chest x-ray.  Seizure activity. Patient 2 episodes of seizure last p.m., stat CT scan of the head did not reveal any acute bleeding, patient was given Keppra, started on a Versed infusion, has had an EEG which was felt to be toxic metabolic encephalopathy. On CT scan patient was noted to have low attenuation area in the internal capsule, at some point will need MRI for further delineation.  Septic shock. Presently on vasopressin and norepinephrine, he is also on stress dose hydrocortisone. He is empirically being covered for CNS disease to include vancomycin, Rocephin, ampicillin, acyclovir. Unable to perform lumbar puncture secondary to the need for systemic anticoagulation secondary to DVT and arterial occlusion  Renal failure. Multiple etiologies include acute tubular necrosis, sepsis, pigment-induced nephropathy secondary to rhabdomyolysis, CK decreasing down to 19,835., on bicarbonate infusion. Started on CRRT secondary to hemodynamics, had difficulty with dialysis catheter readjusted this morning now working well  DKA. Presently on insulin infusion, his anion gap has resolved, blood sugars in the 300s  Vascular occlusion. Vascular surgery has seen patient, feel only therapeutics that they can provide at this time is systemic anticoagulation.  Appreciate their input  Venous thromboembolic disease. Left upper extremity DVT, on systemic heparin, will DC IV line  Transaminitis. Markedly elevated liver function tests, most likely represents shock liver with transaminitis, LFTs began to decrease yesterday.  Critical care time 45 minutes  VENTILATOR SETTINGS: Vent Mode: PRVC FiO2 (%):  [40 %-50 %] 50 % Set Rate:  [18 bmp] 18 bmp Vt Set:  [650 mL] 650 mL PEEP:  [5 cmH20] 5 cmH20  HEMODYNAMICS: CVP:  [7 mmHg-17 mmHg] 7 mmHg  INTAKE / OUTPUT:  Intake/Output Summary (Last 24 hours) at 03/13/2017 0741 Last data filed at 03/13/2017 0600 Gross per 24 hour  Intake 5656.21 ml  Output 435 ml  Net 5221.21 ml     Name: Zachary Graves MRN: [id number] DOB: 14-Mar-1980    ADMISSION DATE:  Apr 08, 2017  SUBJECTIVE:   Over the last 24 hours patient had 2 episodes of seizure. CT scan of the head was emergently performed to rule out intracranial hemorrhage. Low attenuation area in the internal capsule noted, patient loaded with Keppra and started on a Versed drip for sedation.  VITAL SIGNS: Temp:  [97.3 F (36.3 C)-100.4 F (38 C)] 99.5 F (37.5 C) (01/12 0700) Pulse Rate:  [68-94] 94 (01/12 0700) Resp:  [14-20] 18 (01/12 0700) BP: (79-122)/(51-90) 107/70 (01/12 0700) SpO2:  [93 %-98 %] 97 % (01/12 0700) FiO2 (%):  [40 %-50 %] 50 % (01/12 0410) Weight:  [160.6 kg (354 lb 0.9 oz)] 160.6 kg (354 lb 0.9 oz) (01/12 0500)  PHYSICAL EXAMINATION: Physical Examination:   VS: BP 107/70   Pulse 94   Temp 99.5 F (37.5 C)   Resp 18   Ht 5\' 9"  (1.753 m)   Wt (!) 160.6 kg (354 lb 0.9 oz)   SpO2 97%  BMI 52.29 kg/m   General Appearance: Patient is unresponsive, on fentanyl, orally intubated, right internal jugular dialysis catheter, left femoral central line Neuro: Nurse relates that patient has had evidence of responding, breathes above the ventilator, has a cough and a gag with pupillary response HEENT: Oral endotracheal tube noted,  right internal jugular dialysis catheter, trachea is midline Pulmonary: Coarse rhonchi appreciated right greater than left Cardiovascular regular rate and rhythm Abdomen: Soft exam, positive bowel sounds Extremities: Left hand still reveals absent distal radial pulse, left arm is swollen and hand is swollen with some mottling changes.    LABORATORY PANEL:   CBC Recent Labs  Lab 03/13/17 0359  WBC 18.0*  HGB 13.9  HCT 42.3  PLT 226    Chemistries  Recent Labs  Lab 03/12/17 1244  03/13/17 0359  NA 136   < > 140  K 3.6   < > 4.0  CL 100*   < > 102  CO2 26   < > 24  GLUCOSE 362*   < > 204*  BUN 32*   < > 31*  CREATININE 2.35*   < > 2.56*  CALCIUM 6.3*   < > 7.3*  MG 1.9   < > 2.0  PHOS 4.1   < > 3.4  AST 2,115*  --   --   ALT 2,181*  --   --   ALKPHOS 110  --   --   BILITOT 0.6  --   --    < > = values in this interval not displayed.    Recent Labs  Lab 03/13/17 0028 03/13/17 0132 03/13/17 0240 03/13/17 0455 03/13/17 0601 03/13/17 0707  GLUCAP 215* 212* 190* 178* 162* 181*   Recent Labs  Lab 03/11/17 1630 03/12/17 0553 03/12/17 2308  PHART 7.33* 7.34* 7.41  PCO2ART 39 43 40  PO2ART 84 76* 64*   Recent Labs  Lab 03-26-17 2012 03/11/17 0428  03/12/17 1244  03/12/17 2001 03/13/17 0027 03/13/17 0359  AST 4,855* >10,000*  --  2,115*  --   --   --   --   ALT 2,250* 3,543*  --  2,181*  --   --   --   --   ALKPHOS 279*  --   --  110  --   --   --   --   BILITOT 1.2  --   --  0.6  --   --   --   --   ALBUMIN 3.6  --    < > 1.8*  1.8*   < > 1.8* 2.0* 2.2*   < > = values in this interval not displayed.    Cardiac Enzymes Recent Labs  Lab 03/11/17 1241  TROPONINI 1.35*    RADIOLOGY:  Dg Abd 1 View  Result Date: 03/12/2017 CLINICAL DATA:  OG tube placement. EXAM: ABDOMEN - 1 VIEW COMPARISON:  03/11/2017. FINDINGS: OG tube noted with tip below left hemidiaphragm. No bowel distention. IMPRESSION: OG tube noted with tip below left hemidiaphragm.  Electronically Signed   By: Maisie Fus  Register   On: 03/12/2017 15:54   Dg Abd 1 View  Result Date: 03/11/2017 CLINICAL DATA:  Central line placement. EXAM: ABDOMEN - 1 VIEW COMPARISON:  None. FINDINGS: Left femoral central line in place with tip projected over the left upper pelvis. IMPRESSION: Left femoral central line in place with tip projected over the left upper pelvis, tip at the level of the lower left sacrum. Electronically Signed   By:  Bary Richard M.D.   On: 03/11/2017 21:26   Ct Head Wo Contrast  Result Date: 03/13/2017 CLINICAL DATA:  37 year old male with seizure. EXAM: CT HEAD WITHOUT CONTRAST TECHNIQUE: Contiguous axial images were obtained from the base of the skull through the vertex without intravenous contrast. COMPARISON:  Head CT dated 03/25/2017 FINDINGS: Brain: The ventricles and sulci appropriate size for patient's age. Focal areas of low attenuation medial to the lentiform nuclei and in the region of the internal capsule bilaterally appear new compared to the prior CT study. Further evaluation with MRI is recommended. There is no acute intracranial hemorrhage. No mass effect or midline shift. No extra-axial fluid collection. Vascular: No hyperdense vessel or unexpected calcification. Skull: Normal. Negative for fracture or focal lesion. Sinuses/Orbits: There is diffuse mucoperiosteal thickening of paranasal sinuses with opacification of the multiple ethmoid air cells and left maxillary sinus. The mastoid air cells are clear. Other: Diffuse scalp edema or serosanguineous fluid. Clinical correlation is recommended. IMPRESSION: 1. Focal areas of low attenuation in the internal capsule medial to the lentiform nuclei bilaterally, new compared to the prior CT and may represent ischemia. Further evaluation with MRI recommended. 2. No acute intracranial hemorrhage. 3. Paranasal sinus disease. 4. Diffuse scalp edema. These results were called by telephone at the time of interpretation on  03/13/2017 at 12:07 am to nurse practitioner MAGADALENE TUKOV , who verbally acknowledged these results. Electronically Signed   By: Elgie Collard M.D.   On: 03/13/2017 00:17   US Venous Img Upper Uni Left  Result Date: 03/11/2017 CLINICAL DATA:  Left upper extremity swelling.  Evaluate for DVT. EXAM: LEFT UPPER EXTREMITY VENOUS DOPPLER ULTRASOUND TECHNIQUE: Gray-scale sonography with graded compression, as well as color Doppler and duplex ultrasound were performed to evaluate the upper extremity deep venous system from the level of the subclavian vein and including the jugular, axillary, basilic, radial, ulnar and upper cephalic vein. Spectral Doppler was utilized to evaluate flow at rest and with distal augmentation maneuvers. COMPARISON:  None. FINDINGS: Contralateral Subclavian Vein: Respiratory phasicity is normal and symmetric with the symptomatic side. No evidence of thrombus. Normal compressibility. Internal Jugular Vein: No evidence of thrombus. Normal compressibility, respiratory phasicity and response to augmentation. Subclavian Vein: There is hypoechoic occlusive thrombus involving the peripheral aspect of the left subclavian vein (image 9 and 10). The central aspect the left subclavian vein appears patent where imaged. Axillary Vein: There is hypoechoic occlusive thrombus within the left axillary vein (image 13) extending to involve one of the paired brachial veins (images 25 - 27). Cephalic Vein: No evidence of thrombus. Normal compressibility, respiratory phasicity and response to augmentation. Basilic Vein: No evidence of thrombus. Normal compressibility, respiratory phasicity and response to augmentation. Brachial Veins: There is hypoechoic occlusive thrombus within 1 of the paired left brachial veins (images 25-27. The adjacent paired brachial vein appears widely patent. Radial Veins: No evidence of thrombus. Normal compressibility, respiratory phasicity and response to augmentation. Ulnar  Veins: No evidence of thrombus. Normal compressibility, respiratory phasicity and response to augmentation. Other Findings:  None visualized. IMPRESSION: The examination is positive for occlusive DVT extending from the peripheral aspect of the left subclavian vein to involve the left axillary and one of the paired left brachial veins. These results will be called to the ordering clinician or representative by the Radiologist Assistant, and communication documented in the PACS or zVision Dashboard. Electronically Signed   By: Simonne Come M.D.   On: 03/11/2017 13:16   Dg Chest Port 1  View  Result Date: 03/12/2017 CLINICAL DATA:  Hypoxia EXAM: PORTABLE CHEST 1 VIEW COMPARISON:  March 11, 2017 FINDINGS: Endotracheal tube tip is 4.2 cm above the carina. Central catheter tip is in the superior vena cava. No pneumothorax. There is no appreciable edema or consolidation. Heart is upper normal in size with pulmonary vascularity within normal limits. No adenopathy. No bone lesions. IMPRESSION: Tube and catheter positions as described without pneumothorax. No edema or consolidation. Stable cardiac silhouette. Electronically Signed   By: Bretta Bang III M.D.   On: 03/12/2017 07:09   Dg Chest Port 1 View  Result Date: 03/12/2017 CLINICAL DATA:  Central line placement EXAM: PORTABLE CHEST 1 VIEW COMPARISON:  Chest radiograph 03/11/2017 at 7:17 a.m. FINDINGS: Unchanged position of endotracheal tube with tip at the level of the clavicular heads. There is a right internal jugular vein approach central venous catheter with tip in the lower SVC. Shallow lung inflation without focal airspace consolidation or pulmonary edema. Supine radiography limits sensitivity for the detection of pneumothorax, but none is visualized. IMPRESSION: Right IJ central venous catheter with tip in the lower SVC. Electronically Signed   By: Deatra Robinson M.D.   On: 03/12/2017 00:09    Tora Kindred, DO 03/13/2017

## 2017-03-13 NOTE — Progress Notes (Signed)
Pharmacy Antibiotic Note  Ravaughn Busscher is a 37 y.o. male admitted on 03/20/2017 with sepsis.  Pharmacy has been consulted for vancomycin and cefepime dosing.  Regimen changed to vancomycin, ceftriaxone, and acyclovir for r/o meningitis, unable to perform spinal tap due to patient's condition.  Patient is now on CRRT.   Plan: Will adjust vancomycin dosing to 1500 mg iv q 24 hours.   Height: 5\' 9"  (175.3 cm) Weight: (!) 354 lb 0.9 oz (160.6 kg) IBW/kg (Calculated) : 70.7  Temp (24hrs), Avg:99.1 F (37.3 C), Min:97.3 F (36.3 C), Max:100.4 F (38 C)  Recent Labs  Lab 03/07/2017 2012 03/31/2017 2327 03/11/17 0113 03/11/17 0428  03/12/17 0321 03/12/17 0533  03/12/17 1446 03/12/17 2001 03/13/17 0027 03/13/17 0359 03/13/17 0712  WBC 13.4*  --   --  14.4*  --   --  13.1*  --   --   --   --  18.0*  --   CREATININE 3.09*  --  2.81* 2.69*   < > 2.59*  --    < > 2.52* 2.31* 2.58* 2.56* 2.55*  LATICACIDVEN  --  4.2* 4.8* 5.0*  --   --   --   --   --   --   --   --   --   VANCOTROUGH  --   --   --   --   --  20  --   --   --   --   --   --   --    < > = values in this interval not displayed.    Estimated Creatinine Clearance: 60.4 mL/min (A) (by C-G formula based on SCr of 2.55 mg/dL (H)).    Allergies  Allergen Reactions  . Naproxen   . Sulfa Antibiotics Hives    Antimicrobials this admission: Zosyn 1/9 x 1 Cefepime 1/10 x 1 Vancomycin 1/10 >> CTX 1/10 >> Acyclovir 1/10 >>  Dose adjustments this admission:   Microbiology results: 1/9 BCx: NGTD 1/9 UCx: NG 1/9 Sputum: pending  1/10 Wound cx: pending 1/10 MRSA PCR: (+)  Thank you for allowing pharmacy to be a part of this patient's care.  Luisa Hart D 03/13/2017 11:01 AM

## 2017-03-13 NOTE — Progress Notes (Signed)
[facility]  ROUNDING NOTE   Subjective:   CRRT - tolerating well.  No UF.   Off phenylephrine. Vasopressin and norepinephrine gtt.   CT head yesterday after seizure like activity  Tmax 100.4  CK 19835 (35820)  Objective:  Vital signs in last 24 hours:  Temp:  [97.3 F (36.3 C)-100.4 F (38 C)] 99.1 F (37.3 C) (01/12 0930) Pulse Rate:  [68-95] 93 (01/12 0930) Resp:  [14-19] 18 (01/12 0930) BP: (79-125)/(52-90) 116/67 (01/12 0930) SpO2:  [93 %-99 %] 97 % (01/12 0930) FiO2 (%):  [40 %-50 %] 50 % (01/12 0410) Weight:  [160.6 kg (354 lb 0.9 oz)] 160.6 kg (354 lb 0.9 oz) (01/12 0500)  Weight change: 2.9 kg (6 lb 6.3 oz) Filed Weights   03/11/17 0453 03/12/17 0500 03/13/17 0500  Weight: (!) 146.1 kg (322 lb 1.5 oz) (!) 157.7 kg (347 lb 10.7 oz) (!) 160.6 kg (354 lb 0.9 oz)    Intake/Output: I/O last 3 completed shifts: In: 13367.2 [I.V.:10787.2; IV Piggyback:2580] Out: 935 [Urine:935]   Intake/Output this shift:  Total I/O In: 211 [I.V.:211] Out: 35 [Urine:35]  Physical Exam: General: Critically ill  Head: Left parietal ecchymosis, ETT, OGT  Eyes: Eyes closed  Neck: RIJ temp HD catheter  Lungs:  PRVC 50%  Heart: Regular rate and rhythm  Abdomen:  Soft, nontender, obese  Extremities: + peripheral edema. Left upper extremity edema  Neurologic: Intubated, sedated  Skin: Multiple eccymosis  Access: RIJ temp HD catheter 1/85    Basic Metabolic Panel: Recent Labs  Lab 03/12/17 1446 03/12/17 2001 03/13/17 0027 03/13/17 0359 03/13/17 0712  NA 140 139 137 140 137  K 3.7 3.5 4.0 4.0 3.7  CL 102 104 101 102 103  CO2 28 25 24 24 24   GLUCOSE 279* 194* 243* 204* 184*  BUN 33* 31* 32* 31* 28*  CREATININE 2.52* 2.31* 2.58* 2.56* 2.55*  CALCIUM 6.8* 6.3* 7.2* 7.3* 7.2*  MG 2.0 1.7 1.9 2.0 1.8  PHOS 4.2 3.8 4.1 3.4 3.4    Liver Function Tests: Recent Labs  Lab 03/27/2017 2012 03/11/17 0428  03/12/17 1244 03/12/17 1446 03/12/17 2001  03/13/17 0027 03/13/17 0359 03/13/17 0712  AST 4,855* >10,000*  --  2,115*  --   --   --   --   --   ALT 2,250* 3,543*  --  2,181*  --   --   --   --   --   ALKPHOS 279*  --   --  110  --   --   --   --   --   BILITOT 1.2  --   --  0.6  --   --   --   --   --   PROT 8.3*  --   --  4.6*  --   --   --   --   --   ALBUMIN 3.6  --    < > 1.8*  1.8* 1.9* 1.8* 2.0* 2.2* 1.9*   < > = values in this interval not displayed.   Recent Labs  Lab 03/11/17 1543  LIPASE 1,539*  AMYLASE 789*   No results for input(s): AMMONIA in the last 168 hours.  CBC: Recent Labs  Lab 03/04/2017 2012 03/11/17 0428 03/12/17 0533 03/13/17 0359  WBC 13.4* 14.4* 13.1* 18.0*  NEUTROABS 11.5*  --  11.2*  --   HGB 15.5 14.7 13.0 13.9  HCT 54.0* 46.6 40.5 42.3  MCV 94.1 86.2 83.9 83.2  PLT 379 355 224 226    Cardiac Enzymes: Recent Labs  Lab 03/20/2017 2012 03/11/17 0113 03/11/17 0840 03/11/17 1241 03/11/17 1543 03/12/17 0321 03/12/17 1524 03/13/17 0359  CKTOTAL 38,049*  --   --   --  >50,000* >50,000* 35,820* 19,835*  TROPONINI  --  0.67* 1.46* 1.35*  --   --   --   --     BNP: Invalid input(s): POCBNP  CBG: Recent Labs  Lab 03/13/17 0455 03/13/17 0601 03/13/17 0707 03/13/17 0810 03/13/17 0915  GLUCAP 178* 162* 181* 176* 176*    Microbiology: Results for orders placed or performed during the hospital encounter of 03/02/2017  Urine culture     Status: None   Collection Time: 03/04/2017  8:12 PM  Result Value Ref Range Status   Specimen Description   Final    URINE, RANDOM Performed at [facility], [address] [place], [place] [address]    Special Requests   Final    NONE Performed at [facility], [address] [place], [place] [address]    Culture   Final    NO GROWTH Performed at [facility], [place] [address], [place], [place] [address]    Report Status 03/12/2017 FINAL  Final  Blood Culture (routine x 2)     Status: None (Preliminary result)    Collection Time: 03/30/2017 11:28 PM  Result Value Ref Range Status   Specimen Description BLOOD LT [facility]  Final   Special Requests   Final    BOTTLES DRAWN AEROBIC AND ANAEROBIC Blood Culture adequate volume   Culture   Final    NO GROWTH 3 DAYS Performed at [facility], [address] [place], [place] [address]    Report Status PENDING  Incomplete  Blood Culture (routine x 2)     Status: None (Preliminary result)   Collection Time: 03/05/2017 11:28 PM  Result Value Ref Range Status   Specimen Description BLOOD RT [facility]  Final   Special Requests   Final    BOTTLES DRAWN AEROBIC AND ANAEROBIC Blood Culture adequate volume   Culture   Final    NO GROWTH 3 DAYS Performed at [facility], [address], [place], [place] [address]    Report Status PENDING  Incomplete  MRSA PCR Screening     Status: Abnormal   Collection Time: 03/11/17 12:20 AM  Result Value Ref Range Status   MRSA by PCR POSITIVE (A) NEGATIVE Final    Comment:        The GeneXpert MRSA Assay (FDA approved for NASAL specimens only), is one component of a comprehensive MRSA colonization surveillance program. It is not intended to diagnose MRSA infection nor to guide or monitor treatment for MRSA infections. RESULT CALLED TO, READ BACK BY AND VERIFIED WITH: BARBARA THAO ON 03/11/17 AT 0310 JAG Performed at [facility], [address] [place], [place] [address]   Aerobic/Anaerobic Culture (surgical/deep wound)     Status: None (Preliminary result)   Collection Time: 03/11/17 12:53 AM  Result Value Ref Range Status   Specimen Description   Final    ANKLE RIGHT ANKLE Performed at [facility], [address] [place], [place] [address]    Special Requests   Final    NONE Performed at [facility], [place],  [address]    Gram Stain   Final    RARE WBC PRESENT, PREDOMINANTLY PMN FEW GRAM POSITIVE RODS RARE Lonell Grandchild  POSITIVE COCCI IN PAIRS RARE  YEAST    Culture   Final    CULTURE REINCUBATED FOR BETTER GROWTH Performed at [facility], [place] [address], [place], [place] [address]    Report Status PENDING  Incomplete    Coagulation Studies: Recent Labs    03/11/17 1116 03/13/17 0359  LABPROT 16.5* 15.3*  INR 1.34 1.22    Urinalysis: Recent Labs    03/05/2017 2012  COLORURINE YELLOW*  LABSPEC 1.024  PHURINE 6.0  GLUCOSEU >=500*  HGBUR LARGE*  BILIRUBINUR NEGATIVE  KETONESUR NEGATIVE  PROTEINUR 30*  NITRITE NEGATIVE  LEUKOCYTESUR NEGATIVE      Imaging: Dg Abd 1 View  Result Date: 03/12/2017 CLINICAL DATA:  OG tube placement. EXAM: ABDOMEN - 1 VIEW COMPARISON:  03/11/2017. FINDINGS: OG tube noted with tip below left hemidiaphragm. No bowel distention. IMPRESSION: OG tube noted with tip below left hemidiaphragm. Electronically Signed   By: Marcello Moores  Register   On: 03/12/2017 15:54   Dg Abd 1 View  Result Date: 03/11/2017 CLINICAL DATA:  Central line placement. EXAM: ABDOMEN - 1 VIEW COMPARISON:  None. FINDINGS: Left femoral central line in place with tip projected over the left upper pelvis. IMPRESSION: Left femoral central line in place with tip projected over the left upper pelvis, tip at the level of the lower left sacrum. Electronically Signed   By: Franki Cabot M.D.   On: 03/11/2017 21:26   Ct Head Wo Contrast  Result Date: 03/13/2017 CLINICAL DATA:  37 year old male with seizure. EXAM: CT HEAD WITHOUT CONTRAST TECHNIQUE: Contiguous axial images were obtained from the base of the skull through the vertex without intravenous contrast. COMPARISON:  Head CT dated 03/24/2017 FINDINGS: Brain: The ventricles and sulci appropriate size for patient's age. Focal areas of low attenuation medial to the lentiform nuclei and in the region of the internal capsule bilaterally appear new compared to the prior CT study. Further evaluation with MRI is recommended. There is no acute intracranial hemorrhage. No mass effect or  midline shift. No extra-axial fluid collection. Vascular: No hyperdense vessel or unexpected calcification. Skull: Normal. Negative for fracture or focal lesion. Sinuses/Orbits: There is diffuse mucoperiosteal thickening of paranasal sinuses with opacification of the multiple ethmoid air cells and left maxillary sinus. The mastoid air cells are clear. Other: Diffuse scalp edema or serosanguineous fluid. Clinical correlation is recommended. IMPRESSION: 1. Focal areas of low attenuation in the internal capsule medial to the lentiform nuclei bilaterally, new compared to the prior CT and may represent ischemia. Further evaluation with MRI recommended. 2. No acute intracranial hemorrhage. 3. Paranasal sinus disease. 4. Diffuse scalp edema. These results were called by telephone at the time of interpretation on 03/13/2017 at 12:07 am to nurse practitioner MAGADALENE TUKOV , who verbally acknowledged these results. Electronically Signed   By: Anner Crete M.D.   On: 03/13/2017 00:17   US Venous Img Upper Uni Left  Result Date: 03/11/2017 CLINICAL DATA:  Left upper extremity swelling.  Evaluate for DVT. EXAM: LEFT UPPER EXTREMITY VENOUS DOPPLER ULTRASOUND TECHNIQUE: Gray-scale sonography with graded compression, as well as color Doppler and duplex ultrasound were performed to evaluate the upper extremity deep venous system from the level of the subclavian vein and including the jugular, axillary, basilic, radial, ulnar and upper cephalic vein. Spectral Doppler was utilized to evaluate flow at rest and with distal augmentation maneuvers. COMPARISON:  None. FINDINGS: Contralateral Subclavian Vein: Respiratory phasicity is normal and symmetric with the symptomatic side. No evidence of thrombus. Normal  compressibility. Internal Jugular Vein: No evidence of thrombus. Normal compressibility, respiratory phasicity and response to augmentation. Subclavian Vein: There is hypoechoic occlusive thrombus involving the peripheral  aspect of the left subclavian vein (image 9 and 10). The central aspect the left subclavian vein appears patent where imaged. Axillary Vein: There is hypoechoic occlusive thrombus within the left axillary vein (image 13) extending to involve one of the paired brachial veins (images 25 - 27). Cephalic Vein: No evidence of thrombus. Normal compressibility, respiratory phasicity and response to augmentation. Basilic Vein: No evidence of thrombus. Normal compressibility, respiratory phasicity and response to augmentation. Brachial Veins: There is hypoechoic occlusive thrombus within 1 of the paired left brachial veins (images 25-27. The adjacent paired brachial vein appears widely patent. Radial Veins: No evidence of thrombus. Normal compressibility, respiratory phasicity and response to augmentation. Ulnar Veins: No evidence of thrombus. Normal compressibility, respiratory phasicity and response to augmentation. Other Findings:  None visualized. IMPRESSION: The examination is positive for occlusive DVT extending from the peripheral aspect of the left subclavian vein to involve the left axillary and one of the paired left brachial veins. These results will be called to the ordering clinician or representative by the Radiologist Assistant, and communication documented in the PACS or zVision Dashboard. Electronically Signed   By: Sandi Mariscal M.D.   On: 03/11/2017 13:16   Dg Chest Port 1 View  Result Date: 03/12/2017 CLINICAL DATA:  Hypoxia EXAM: PORTABLE CHEST 1 VIEW COMPARISON:  March 11, 2017 FINDINGS: Endotracheal tube tip is 4.2 cm above the carina. Central catheter tip is in the superior vena cava. No pneumothorax. There is no appreciable edema or consolidation. Heart is upper normal in size with pulmonary vascularity within normal limits. No adenopathy. No bone lesions. IMPRESSION: Tube and catheter positions as described without pneumothorax. No edema or consolidation. Stable cardiac silhouette.  Electronically Signed   By: Lowella Grip III M.D.   On: 03/12/2017 07:09   Dg Chest Port 1 View  Result Date: 03/12/2017 CLINICAL DATA:  Central line placement EXAM: PORTABLE CHEST 1 VIEW COMPARISON:  Chest radiograph 03/11/2017 at 7:17 a.m. FINDINGS: Unchanged position of endotracheal tube with tip at the level of the clavicular heads. There is a right internal jugular vein approach central venous catheter with tip in the lower SVC. Shallow lung inflation without focal airspace consolidation or pulmonary edema. Supine radiography limits sensitivity for the detection of pneumothorax, but none is visualized. IMPRESSION: Right IJ central venous catheter with tip in the lower SVC. Electronically Signed   By: Ulyses Jarred M.D.   On: 03/12/2017 00:09     Medications:   . sodium chloride    . acyclovir 1,000 mg (03/13/17 0930)  . cefTRIAXone (ROCEPHIN)  IV Stopped (03/13/17 0218)  . dextrose 5 % and 0.45% NaCl    . famotidine (PEPCID) IV Stopped (03/13/17 0145)  . fentaNYL 200 mcg/hr (03/13/17 0700)  . heparin 2,400 Units/hr (03/13/17 0700)  . insulin (NOVOLIN-R) infusion 23.2 Units/hr (03/13/17 0915)  . midazolam (VERSED) infusion 4 mg/hr (03/13/17 0700)  . norepinephrine (LEVOPHED) Adult infusion 25 mcg/min (03/13/17 0937)  . pureflow 1,500 mL/hr at 03/13/17 7591  .  sodium bicarbonate  infusion 1000 mL 100 mL/hr at 03/13/17 0918  . sodium chloride     And  . sodium chloride     And  . sodium chloride     And  . sodium chloride     And  . sodium chloride    . vancomycin Stopped (03/13/17  5093)  . vasopressin (PITRESSIN) infusion - *FOR SHOCK* 0.03 Units/min (03/13/17 0700)   . alteplase  2 mg Intracatheter Once  . alteplase  2 mg Intracatheter Once  . chlorhexidine gluconate (MEDLINE KIT)  15 mL Mouth Rinse BID  . Chlorhexidine Gluconate Cloth  6 each Topical Q0600  . hydrocortisone sod succinate (SOLU-CORTEF) inj  50 mg Intravenous Q6H  . mouth rinse  15 mL Mouth Rinse 10  times per day  . mupirocin ointment  1 application Nasal BID   sodium chloride, acetaminophen, heparin, midazolam, ondansetron (ZOFRAN) IV  Assessment/ Plan:  Mr. Zachary Graves is a 37 y.o. white male with insulin dependent diabetes mellitus, hypertension, morbid obesity, diabetic peripheral neuropathy, Legg-calve-Perthes Disease, osteoarthritis, who was admitted to [facility] on 03/05/2017 for diabetic ketoacidosis  1. Acute Renal Failure: nonoliguric urine output. Baseline creatinine of 0.94, normal GFR on 01/08/17.  Requiring CRRT CVVHD therapy rate 1500, BFR 250 - Massive volume overload - start UF 7m/hr  2. Diabetic ketoacidosis/metabolic acidosis. Diabetes mellitus type I Insulin Dependent. Poorly controlled, Hemoglobin A1c 15%.  - on DKA protocol. Anion gap closed. Insulin gtt, Sodium bicarb gtt  3. Shock: with suspected sepsis - cultures negative. On empiric ceftriaxone and vancomycin. Systemic steroids.   4. Rhabdomyolysis - peaked CPK >50,000 - Bicarb gtt - CK trending downward.   5. Transaminitis: secondary to shock liver - pending new AST/ALT today  6. Acute Pancreatitis: lipase and amylase elevated       LOS: 3 Zachary Graves 1/12/20199:48 AM

## 2017-03-13 NOTE — Progress Notes (Signed)
[employer] [facility] [place] at [facility]   PATIENT NAME: Zachary Graves    MR#:  [id number]  DATE OF BIRTH:  02-03-1981  SUBJECTIVE:   he remains critically ill with multiorgan failure. Remains on the vent with FiO2 at 40%, remains on 2 vasopressors with vasopressin and Levophed. Currently sedated and intubated.  REVIEW OF SYSTEMS:    Review of Systems  Unable to perform ROS: Intubated    Nutrition: Tube feeds Tolerating Diet: Yes Tolerating PT: Await Eval once Extubated.   DRUG ALLERGIES:   Allergies  Allergen Reactions  . Naproxen   . Sulfa Antibiotics Hives    VITALS:  Blood pressure (!) 95/53, pulse 98, temperature 98.6 F (37 C), resp. rate 18, height 5\' 9"  (1.753 m), weight (!) 160.6 kg (354 lb 0.9 oz), SpO2 97 %.  PHYSICAL EXAMINATION:   Physical Exam  GENERAL:  37 y.o.-year-old obese patient lying in bed sedated & intubated.  EYES: Pupils equal, round, reactive to light. No scleral icterus. HEENT: Head atraumatic, normocephalic. ET and OG tubes in place.  NECK:  Supple, no jugular venous distention. No thyroid enlargement, no tenderness.  LUNGS: Normal breath sounds bilaterally, no wheezing, rales, rhonchi. No use of accessory muscles of respiration.  CARDIOVASCULAR: S1, S2 normal. No murmurs, rubs, or gallops.  ABDOMEN: Soft, nontender, nondistended. Bowel sounds present. No organomegaly or mass.  EXTREMITIES: No cyanosis, clubbing or edema b/l.   Left upper ext. Edema > right.  NEUROLOGIC: Sedated & Intubated  PSYCHIATRIC: Sedated and Intubated.  SKIN: No obvious rash, lesion, or ulcer. Sign of chronic venous stasis and some dry excoriating areas in the left foot.   LABORATORY PANEL:   CBC Recent Labs  Lab 03/13/17 0359  WBC 18.0*  HGB 13.9  HCT 42.3  PLT 226   ------------------------------------------------------------------------------------------------------------------  Chemistries  Recent Labs  Lab 03/12/17 1244   03/13/17 1058  NA 136   < > 139  K 3.6   < > 3.8  CL 100*   < > 101  CO2 26   < > 26  GLUCOSE 362*   < > 190*  BUN 32*   < > 27*  CREATININE 2.35*   < > 2.50*  CALCIUM 6.3*   < > 7.1*  MG 1.9   < > 1.7  AST 2,115*  --   --   ALT 2,181*  --   --   ALKPHOS 110  --   --   BILITOT 0.6  --   --    < > = values in this interval not displayed.   ------------------------------------------------------------------------------------------------------------------  Cardiac Enzymes Recent Labs  Lab 03/11/17 1241  TROPONINI 1.35*   ------------------------------------------------------------------------------------------------------------------  RADIOLOGY:  Dg Abd 1 View  Result Date: 03/12/2017 CLINICAL DATA:  OG tube placement. EXAM: ABDOMEN - 1 VIEW COMPARISON:  03/11/2017. FINDINGS: OG tube noted with tip below left hemidiaphragm. No bowel distention. IMPRESSION: OG tube noted with tip below left hemidiaphragm. Electronically Signed   By: Maisie Fus  Register   On: 03/12/2017 15:54   Dg Abd 1 View  Result Date: 03/11/2017 CLINICAL DATA:  Central line placement. EXAM: ABDOMEN - 1 VIEW COMPARISON:  None. FINDINGS: Left femoral central line in place with tip projected over the left upper pelvis. IMPRESSION: Left femoral central line in place with tip projected over the left upper pelvis, tip at the level of the lower left sacrum. Electronically Signed   By: Bary Richard M.D.   On: 03/11/2017 21:26   Ct  Head Wo Contrast  Result Date: 03/13/2017 CLINICAL DATA:  37 year old male with seizure. EXAM: CT HEAD WITHOUT CONTRAST TECHNIQUE: Contiguous axial images were obtained from the base of the skull through the vertex without intravenous contrast. COMPARISON:  Head CT dated 03/18/2017 FINDINGS: Brain: The ventricles and sulci appropriate size for patient's age. Focal areas of low attenuation medial to the lentiform nuclei and in the region of the internal capsule bilaterally appear new compared to  the prior CT study. Further evaluation with MRI is recommended. There is no acute intracranial hemorrhage. No mass effect or midline shift. No extra-axial fluid collection. Vascular: No hyperdense vessel or unexpected calcification. Skull: Normal. Negative for fracture or focal lesion. Sinuses/Orbits: There is diffuse mucoperiosteal thickening of paranasal sinuses with opacification of the multiple ethmoid air cells and left maxillary sinus. The mastoid air cells are clear. Other: Diffuse scalp edema or serosanguineous fluid. Clinical correlation is recommended. IMPRESSION: 1. Focal areas of low attenuation in the internal capsule medial to the lentiform nuclei bilaterally, new compared to the prior CT and may represent ischemia. Further evaluation with MRI recommended. 2. No acute intracranial hemorrhage. 3. Paranasal sinus disease. 4. Diffuse scalp edema. These results were called by telephone at the time of interpretation on 03/13/2017 at 12:07 am to nurse practitioner MAGADALENE TUKOV , who verbally acknowledged these results. Electronically Signed   By: Elgie Collard M.D.   On: 03/13/2017 00:17   Dg Chest Port 1 View  Result Date: 03/12/2017 CLINICAL DATA:  Hypoxia EXAM: PORTABLE CHEST 1 VIEW COMPARISON:  March 11, 2017 FINDINGS: Endotracheal tube tip is 4.2 cm above the carina. Central catheter tip is in the superior vena cava. No pneumothorax. There is no appreciable edema or consolidation. Heart is upper normal in size with pulmonary vascularity within normal limits. No adenopathy. No bone lesions. IMPRESSION: Tube and catheter positions as described without pneumothorax. No edema or consolidation. Stable cardiac silhouette. Electronically Signed   By: Bretta Bang III M.D.   On: 03/12/2017 07:09   Dg Chest Port 1 View  Result Date: 03/12/2017 CLINICAL DATA:  Central line placement EXAM: PORTABLE CHEST 1 VIEW COMPARISON:  Chest radiograph 03/11/2017 at 7:17 a.m. FINDINGS: Unchanged position  of endotracheal tube with tip at the level of the clavicular heads. There is a right internal jugular vein approach central venous catheter with tip in the lower SVC. Shallow lung inflation without focal airspace consolidation or pulmonary edema. Supine radiography limits sensitivity for the detection of pneumothorax, but none is visualized. IMPRESSION: Right IJ central venous catheter with tip in the lower SVC. Electronically Signed   By: Deatra Robinson M.D.   On: 03/12/2017 00:09     ASSESSMENT AND PLAN:   37 year old male with past medical history of morbid obesity, diabetes, medical noncompliance, diabetic neuropathy who presented to the hospital due to altered mental status and noted to be in acute rhabdomyolysis and also acute diabetic ketoacidosis.  1. Altered mental status/encephalopathy-patient is currently intubated and sedated. -CT head although was positive for possible subacute CVA. Seen by neurology and plan to do a repeat CT head tomorrow as patient cannot have an MRI. -Empirically patient has been started on antiepileptics with Keppra. -We'll continue to follow mental status.  2. Sepsis-no clear source identified. Continue broad-spectrum IV antibiotics with vancomycin, cefepime. Patient also being empirically treated with encephalitis with acyclovir. -Cultures so far remain negative. Continue stress dose steroids, continue vasopressors with Levophed and vasopressin. -Follow hemodynamics and follow cultures.  3. Acute diabetic ketoacidosis-secondary  to patient's noncompliance. -Improved with IV fluids, insulin drip. Anion gap is currently closed. Continue on insulin drip per ICU protocol. Follow blood sugars.  4. Acute renal failure-secondary to sepsis. Patient remains oliguric, seen by nephrology, continue CRRT. Follow urine output. Continue further care as per nephrology.  5. Left upper extremity DVT-continue heparin drip.  6. Acute rhabdomyolysis-continue IV fluid hydration,  sodium bicarbonate. Follow CKs. Follow renal function.  7. Abnormal LFTs-secondary to shock liver from septic shock and multiorgan failure. -Follow LFTs, continue supportive care.  Patient is critically ill with multiorgan failure. Prognosis remains guarded to poor.  All the records are reviewed and case discussed with Care Management/Social Worker. Management plans discussed with the patient, family and they are in agreement.  CODE STATUS: Full  DVT Prophylaxis: Heparin gtt  TOTAL TIME TAKING CARE OF THIS PATIENT: 30 minutes.   POSSIBLE D/C unclear, DEPENDING ON CLINICAL CONDITION.   Houston Siren M.D on 03/13/2017 at 3:05 PM  Between 7am to 6pm - Pager - [phone]  After 6pm go to [email or web address] - Social research officer, government  [facility]  Office  [phone]  CC: Primary care physician; Patient, No Pcp Per

## 2017-03-13 NOTE — Progress Notes (Addendum)
Subjective: Patient remains intubated and sedated.  Seizures last evening where pt was loaded on Keppra and started on versed. No further seizures noted afterwards.      Objective: Current vital signs: BP (!) 89/51   Pulse 99   Temp 98.8 F (37.1 C)   Resp 18   Ht _0  (1.753 m)   Wt (!) 354 lb 0.9 oz (160.6 kg)   SpO2 96%   BMI 52.29 kg/m  Vital signs in last 24 hours: Temp:  [97.3 F (36.3 C)-100.4 F (38 C)] 98.8 F (37.1 C) (01/12 1300) Pulse Rate:  [68-114] 99 (01/12 1300) Resp:  [11-19] 18 (01/12 1300) BP: (79-125)/(51-90) 89/51 (01/12 1300) SpO2:  [93 %-99 %] 96 % (01/12 1300) FiO2 (%):  [40 %-50 %] 40 % (01/12 1145) Weight:  [354 lb 0.9 oz (160.6 kg)] 354 lb 0.9 oz (160.6 kg) (01/12 0500)  Intake/Output from previous day: 01/11 0701 - 01/12 0700 In: 6496.4 [I.V.:4956.4; IV Piggyback:1540] Out: 435 [Urine:435] Intake/Output this shift: Total I/O In: 1450.4 [I.V.:1250.4; IV Piggyback:200] Out: 112 [Urine:35; Other:77] Nutritional status: Diet NPO time specified  Neurologic Exam: Mental Status: Patient does not respond to verbal stimuli.  Does not respond to deep sternal rub.  Does not follow commands.  No verbalizations noted.  Cranial Nerves: II: patient does not respond confrontation bilaterally, pupils right 3 mm, left 3 mm,and reactive bilaterally III,IV,VI: doll's response absent bilaterally. V,VII: corneal reflex reduced bilaterally  VIII: patient does not respond to verbal stimuli IX,X: gag reflex reduced, XI: trapezius strength unable to test bilaterally XII: tongue strength unable to test Motor: Extremities flaccid throughout.  No spontaneous movement noted.  No purposeful movements noted. Sensory: Does not respond to noxious stimuli in any extremity.   Lab Results: Basic Metabolic Panel: Recent Labs  Lab 03/12/17 2001 03/13/17 0027 03/13/17 0359 03/13/17 0712 03/13/17 1058  NA 139 137 140 137 139  K 3.5 4.0 4.0 3.7 3.8  CL 104 101 102  103 101  CO2 _1 GLUCOSE 194* 243* 204* 184* 190*  BUN 31* 32* 31* 28* 27*  CREATININE 2.31* 2.58* 2.56* 2.55* 2.50*  CALCIUM 6.3* 7.2* 7.3* 7.2* 7.1*  MG 1.7 1.9 2.0 1.8 1.7  PHOS 3.8 4.1 3.4 3.4 3.6    Liver Function Tests: Recent Labs  Lab 03/11/2017 2012 03/11/17 0428  03/12/17 1244  03/12/17 2001 03/13/17 0027 03/13/17 0359 03/13/17 0712 03/13/17 1058  AST 4,855* >10,000*  --  2,115*  --   --   --   --   --   --   ALT 2,250* 3,543*  --  2,181*  --   --   --   --   --   --   ALKPHOS 279*  --   --  110  --   --   --   --   --   --   BILITOT 1.2  --   --  0.6  --   --   --   --   --   --   PROT 8.3*  --   --  4.6*  --   --   --   --   --   --   ALBUMIN 3.6  --    < > 1.8*  1.8*   < > 1.8* 2.0* 2.2* 1.9* 1.9*   < > = values in this interval not displayed.   Recent Labs  Lab 03/11/17 1543  LIPASE 1,539*  AMYLASE 789*   No results for input(s): AMMONIA in the last 168 hours.  CBC: Recent Labs  Lab 03/11/2017 2012 03/11/17 0428 03/12/17 0533 03/13/17 0359  WBC 13.4* 14.4* 13.1* 18.0*  NEUTROABS 11.5*  --  11.2*  --   HGB 15.5 14.7 13.0 13.9  HCT 54.0* 46.6 40.5 42.3  MCV 94.1 86.2 83.9 83.2  PLT 379 355 224 226    Cardiac Enzymes: Recent Labs  Lab 03/08/2017 2012 03/11/17 0113 03/11/17 0840 03/11/17 1241 03/11/17 1543 03/12/17 0321 03/12/17 1524 03/13/17 0359  CKTOTAL 38,049*  --   --   --  >50,000* >50,000* 35,820* 19,835*  TROPONINI  --  0.67* 1.46* 1.35*  --   --   --   --     Lipid Panel: No results for input(s): CHOL, TRIG, HDL, CHOLHDL, VLDL, LDLCALC in the last 168 hours.  CBG: Recent Labs  Lab 03/13/17 0915 03/13/17 1013 03/13/17 1108 03/13/17 1211 03/13/17 1309  GLUCAP 176* 198* 169* 180* 175*    Microbiology: Results for orders placed or performed during the hospital encounter of 03/06/2017  Urine culture     Status: None   Collection Time: 04/01/2017  8:12 PM  Result Value Ref Range Status   Specimen Description   Final     URINE, RANDOM Performed at [facility], [address] [place], [place] [address]    Special Requests   Final    NONE Performed at [facility], [address] [place], [place] [address]    Culture   Final    NO GROWTH Performed at [facility], [place] [address], [place], [place] [address]    Report Status 03/12/2017 FINAL  Final  Blood Culture (routine x 2)     Status: None (Preliminary result)   Collection Time: 03/18/2017 11:28 PM  Result Value Ref Range Status   Specimen Description BLOOD LT [facility]  Final   Special Requests   Final    BOTTLES DRAWN AEROBIC AND ANAEROBIC Blood Culture adequate volume   Culture   Final    NO GROWTH 3 DAYS Performed at [facility], [address], [place], [place] [address]    Report Status PENDING  Incomplete  Blood Culture (routine x 2)     Status: None (Preliminary result)   Collection Time: 03/05/2017 11:28 PM  Result Value Ref Range Status   Specimen Description BLOOD RT [facility]  Final   Special Requests   Final    BOTTLES DRAWN AEROBIC AND ANAEROBIC Blood Culture adequate volume   Culture   Final    NO GROWTH 3 DAYS Performed at [facility], [address] [place], [place] [address]    Report Status PENDING  Incomplete  MRSA PCR Screening     Status: Abnormal   Collection Time: 03/11/17 12:20 AM  Result Value Ref Range Status   MRSA by PCR POSITIVE (A) NEGATIVE Final    Comment:        The GeneXpert MRSA Assay (FDA approved for NASAL specimens only), is one component of a comprehensive MRSA colonization surveillance program. It is not intended to diagnose MRSA infection nor to guide or monitor treatment for MRSA infections. RESULT CALLED TO, READ BACK BY AND VERIFIED WITH: BARBARA THAO ON 03/11/17 AT 0310 JAG Performed at [facility], [address] [place], [place] [address]   Aerobic/Anaerobic Culture (surgical/deep wound)     Status: None (Preliminary result)    Collection Time: 03/11/17 12:53 AM  Result Value Ref Range Status   Specimen Description   Final    ANKLE RIGHT ANKLE Performed at [facility], [address] [place], [place] [address]    Special Requests   Final    NONE Performed at [facility], [address] [place], [place] [address]    Gram Stain   Final    RARE WBC PRESENT, PREDOMINANTLY PMN FEW GRAM POSITIVE RODS RARE GRAM POSITIVE COCCI IN PAIRS RARE YEAST Performed at [facility], [place] [address] [place], [place] [address]    Culture   Final    ABUNDANT STAPHYLOCOCCUS AUREUS SUSCEPTIBILITIES TO FOLLOW MODERATE UNIDENTIFIED ORGANISM FEW YEAST    Report Status PENDING  Incomplete    Coagulation Studies: Recent Labs    03/11/17 1116 03/13/17 0359  LABPROT 16.5* 15.3*  INR 1.34 1.22    Imaging: Dg Abd 1 View  Result Date: 03/12/2017 CLINICAL DATA:  OG tube placement. EXAM: ABDOMEN - 1 VIEW COMPARISON:  03/11/2017. FINDINGS: OG tube noted with tip below left hemidiaphragm. No bowel distention. IMPRESSION: OG tube noted with tip below left hemidiaphragm. Electronically Signed   By: Marcello Moores  Register   On: 03/12/2017 15:54   Dg Abd 1 View  Result Date: 03/11/2017 CLINICAL DATA:  Central line placement. EXAM: ABDOMEN - 1 VIEW COMPARISON:  None. FINDINGS: Left femoral central line in place with tip projected over the left upper pelvis. IMPRESSION: Left femoral central line in place with tip projected over the left upper pelvis, tip at the level of the lower left sacrum. Electronically Signed   By: Franki Cabot M.D.   On: 03/11/2017 21:26   Ct Head Wo Contrast  Result Date: 03/13/2017 CLINICAL DATA:  37 year old male with seizure. EXAM: CT HEAD WITHOUT CONTRAST TECHNIQUE: Contiguous axial images were obtained from the base of the skull through the vertex without intravenous contrast. COMPARISON:  Head CT dated 03/17/2017 FINDINGS: Brain: The ventricles and sulci appropriate size for  patient's age. Focal areas of low attenuation medial to the lentiform nuclei and in the region of the internal capsule bilaterally appear new compared to the prior CT study. Further evaluation with MRI is recommended. There is no acute intracranial hemorrhage. No mass effect or midline shift. No extra-axial fluid collection. Vascular: No hyperdense vessel or unexpected calcification. Skull: Normal. Negative for fracture or focal lesion. Sinuses/Orbits: There is diffuse mucoperiosteal thickening of paranasal sinuses with opacification of the multiple ethmoid air cells and left maxillary sinus. The mastoid air cells are clear. Other: Diffuse scalp edema or serosanguineous fluid. Clinical correlation is recommended. IMPRESSION: 1. Focal areas of low attenuation in the internal capsule medial to the lentiform nuclei bilaterally, new compared to the prior CT and may represent ischemia. Further evaluation with MRI recommended. 2. No acute intracranial hemorrhage. 3. Paranasal sinus disease. 4. Diffuse scalp edema. These results were called by telephone at the time of interpretation on 03/13/2017 at 12:07 am to nurse practitioner MAGADALENE TUKOV , who verbally acknowledged these results. Electronically Signed   By: Anner Crete M.D.   On: 03/13/2017 00:17   Dg Chest Port 1 View  Result Date: 03/12/2017 CLINICAL DATA:  Hypoxia EXAM: PORTABLE CHEST 1 VIEW COMPARISON:  March 11, 2017 FINDINGS: Endotracheal tube tip is 4.2 cm above the carina. Central catheter tip is in the superior vena cava. No pneumothorax. There is no appreciable edema or consolidation. Heart is upper normal in size with pulmonary vascularity within normal limits. No adenopathy. No bone lesions. IMPRESSION:  Tube and catheter positions as described without pneumothorax. No edema or consolidation. Stable cardiac silhouette. Electronically Signed   By: Lowella Grip III M.D.   On: 03/12/2017 07:09   Dg Chest Port 1 View  Result Date:  03/12/2017 CLINICAL DATA:  Central line placement EXAM: PORTABLE CHEST 1 VIEW COMPARISON:  Chest radiograph 03/11/2017 at 7:17 a.m. FINDINGS: Unchanged position of endotracheal tube with tip at the level of the clavicular heads. There is a right internal jugular vein approach central venous catheter with tip in the lower SVC. Shallow lung inflation without focal airspace consolidation or pulmonary edema. Supine radiography limits sensitivity for the detection of pneumothorax, but none is visualized. IMPRESSION: Right IJ central venous catheter with tip in the lower SVC. Electronically Signed   By: Ulyses Jarred M.D.   On: 03/12/2017 00:09    Medications:  I have reviewed the patient's current medications. Scheduled: . alteplase  2 mg Intracatheter Once  . alteplase  2 mg Intracatheter Once  . chlorhexidine gluconate (MEDLINE KIT)  15 mL Mouth Rinse BID  . Chlorhexidine Gluconate Cloth  6 each Topical Q0600  . hydrocortisone sod succinate (SOLU-CORTEF) inj  50 mg Intravenous Q6H  . mouth rinse  15 mL Mouth Rinse 10 times per day  . mupirocin ointment  1 application Nasal BID    Assessment/Plan: Patient intubated and sedated.  Has multisystem failure.  On broad-spectrum antibiotics due to inability to determine source at this time.  Patient also on heparin for left upper extremity DVT Had seizures last evening which are likely related to his encephalopathy and metabolic problems requiring pressors and CCRT. Less likely due to stroke as below since not cortical  - CTH possibly L internal capsule stroke evolution that is new.  - Cant travel for MRI at this time due to number of drips and stability -  Keppra started daily at 528m bid as pt is on CCRT with ultrafiltrate - Con't Versed gtt at 4   - guarded prognosis    LOS: 3 days    03/13/2017  1:24 PM

## 2017-03-13 NOTE — Progress Notes (Signed)
CH responded to a request to consult with patient's father. CH arrived and patient's family was not in the room. CH checked the waiting area. CH will place not to follow-up with request at a later time.

## 2017-03-14 ENCOUNTER — Inpatient Hospital Stay: Payer: Medicaid Other

## 2017-03-14 LAB — GLUCOSE, CAPILLARY
GLUCOSE-CAPILLARY: 165 mg/dL — AB (ref 65–99)
GLUCOSE-CAPILLARY: 228 mg/dL — AB (ref 65–99)
GLUCOSE-CAPILLARY: 170 mg/dL — AB (ref 65–99)
GLUCOSE-CAPILLARY: 164 mg/dL — AB (ref 65–99)
GLUCOSE-CAPILLARY: 179 mg/dL — AB (ref 65–99)
GLUCOSE-CAPILLARY: 190 mg/dL — AB (ref 65–99)
GLUCOSE-CAPILLARY: 183 mg/dL — AB (ref 65–99)
GLUCOSE-CAPILLARY: 145 mg/dL — AB (ref 65–99)
GLUCOSE-CAPILLARY: 159 mg/dL — AB (ref 65–99)
GLUCOSE-CAPILLARY: 167 mg/dL — AB (ref 65–99)
GLUCOSE-CAPILLARY: 161 mg/dL — AB (ref 65–99)
Glucose-Capillary: 141 mg/dL — ABNORMAL HIGH (ref 65–99)
Glucose-Capillary: 148 mg/dL — ABNORMAL HIGH (ref 65–99)
Glucose-Capillary: 159 mg/dL — ABNORMAL HIGH (ref 65–99)
Glucose-Capillary: 162 mg/dL — ABNORMAL HIGH (ref 65–99)
Glucose-Capillary: 172 mg/dL — ABNORMAL HIGH (ref 65–99)
Glucose-Capillary: 172 mg/dL — ABNORMAL HIGH (ref 65–99)
Glucose-Capillary: 176 mg/dL — ABNORMAL HIGH (ref 65–99)
Glucose-Capillary: 180 mg/dL — ABNORMAL HIGH (ref 65–99)
Glucose-Capillary: 184 mg/dL — ABNORMAL HIGH (ref 65–99)
Glucose-Capillary: 185 mg/dL — ABNORMAL HIGH (ref 65–99)
Glucose-Capillary: 186 mg/dL — ABNORMAL HIGH (ref 65–99)
Glucose-Capillary: 190 mg/dL — ABNORMAL HIGH (ref 65–99)
Glucose-Capillary: 199 mg/dL — ABNORMAL HIGH (ref 65–99)

## 2017-03-14 LAB — BLOOD GAS, ARTERIAL
ALLENS TEST (PASS/FAIL): POSITIVE — AB
Acid-Base Excess: 5.8 mmol/L — ABNORMAL HIGH (ref 0.0–2.0)
BICARBONATE: 30.6 mmol/L — AB (ref 20.0–28.0)
FIO2: 0.3
LHR: 18 {breaths}/min
MECHVT: 650 mL
O2 Saturation: 88.1 %
PATIENT TEMPERATURE: 37
PEEP: 5 cmH2O
PO2 ART: 52 mmHg — AB (ref 83.0–108.0)
pCO2 arterial: 44 mmHg (ref 32.0–48.0)
pH, Arterial: 7.45 (ref 7.350–7.450)

## 2017-03-14 LAB — RENAL FUNCTION PANEL
ALBUMIN: 1.6 g/dL — AB (ref 3.5–5.0)
ALBUMIN: 1.7 g/dL — AB (ref 3.5–5.0)
ANION GAP: 9 (ref 5–15)
ANION GAP: 9 (ref 5–15)
ANION GAP: 9 (ref 5–15)
Albumin: 1.7 g/dL — ABNORMAL LOW (ref 3.5–5.0)
Albumin: 1.8 g/dL — ABNORMAL LOW (ref 3.5–5.0)
Albumin: 1.7 g/dL — ABNORMAL LOW (ref 3.5–5.0)
Anion gap: 9 (ref 5–15)
Anion gap: 9 (ref 5–15)
BUN: 26 mg/dL — ABNORMAL HIGH (ref 6–20)
BUN: 26 mg/dL — ABNORMAL HIGH (ref 6–20)
BUN: 25 mg/dL — ABNORMAL HIGH (ref 6–20)
BUN: 28 mg/dL — ABNORMAL HIGH (ref 6–20)
BUN: 29 mg/dL — ABNORMAL HIGH (ref 6–20)
CALCIUM: 6.8 mg/dL — AB (ref 8.9–10.3)
CALCIUM: 7.1 mg/dL — AB (ref 8.9–10.3)
CALCIUM: 7.1 mg/dL — AB (ref 8.9–10.3)
CALCIUM: 6.9 mg/dL — AB (ref 8.9–10.3)
CHLORIDE: 100 mmol/L — AB (ref 101–111)
CO2: 28 mmol/L (ref 22–32)
CO2: 27 mmol/L (ref 22–32)
CO2: 28 mmol/L (ref 22–32)
CO2: 29 mmol/L (ref 22–32)
CO2: 29 mmol/L (ref 22–32)
CREATININE: 2.97 mg/dL — AB (ref 0.61–1.24)
Calcium: 7.2 mg/dL — ABNORMAL LOW (ref 8.9–10.3)
Chloride: 99 mmol/L — ABNORMAL LOW (ref 101–111)
Chloride: 99 mmol/L — ABNORMAL LOW (ref 101–111)
Chloride: 98 mmol/L — ABNORMAL LOW (ref 101–111)
Chloride: 98 mmol/L — ABNORMAL LOW (ref 101–111)
Creatinine, Ser: 2.58 mg/dL — ABNORMAL HIGH (ref 0.61–1.24)
Creatinine, Ser: 2.66 mg/dL — ABNORMAL HIGH (ref 0.61–1.24)
Creatinine, Ser: 2.67 mg/dL — ABNORMAL HIGH (ref 0.61–1.24)
Creatinine, Ser: 2.81 mg/dL — ABNORMAL HIGH (ref 0.61–1.24)
GFR calc Af Amer: 34 mL/min — ABNORMAL LOW (ref >60)
GFR calc Af Amer: 32 mL/min — ABNORMAL LOW (ref >60)
GFR calc non Af Amer: 30 mL/min — ABNORMAL LOW (ref >60)
GFR calc non Af Amer: 29 mL/min — ABNORMAL LOW (ref >60)
GFR calc non Af Amer: 29 mL/min — ABNORMAL LOW (ref >60)
GFR, EST AFRICAN AMERICAN: 35 mL/min — AB (ref >60)
GFR, EST AFRICAN AMERICAN: 34 mL/min — AB (ref >60)
GFR, EST AFRICAN AMERICAN: 30 mL/min — AB (ref >60)
GFR, EST NON AFRICAN AMERICAN: 27 mL/min — AB (ref >60)
GFR, EST NON AFRICAN AMERICAN: 26 mL/min — AB (ref >60)
GLUCOSE: 200 mg/dL — AB (ref 65–99)
Glucose, Bld: 244 mg/dL — ABNORMAL HIGH (ref 65–99)
Glucose, Bld: 209 mg/dL — ABNORMAL HIGH (ref 65–99)
Glucose, Bld: 212 mg/dL — ABNORMAL HIGH (ref 65–99)
Glucose, Bld: 184 mg/dL — ABNORMAL HIGH (ref 65–99)
PHOSPHORUS: 2.9 mg/dL (ref 2.5–4.6)
POTASSIUM: 4.2 mmol/L (ref 3.5–5.1)
POTASSIUM: 4.1 mmol/L (ref 3.5–5.1)
POTASSIUM: 3.9 mmol/L (ref 3.5–5.1)
Phosphorus: 3.3 mg/dL (ref 2.5–4.6)
Phosphorus: 2.9 mg/dL (ref 2.5–4.6)
Phosphorus: 3 mg/dL (ref 2.5–4.6)
Phosphorus: 3.1 mg/dL (ref 2.5–4.6)
Potassium: 3.9 mmol/L (ref 3.5–5.1)
Potassium: 3.8 mmol/L (ref 3.5–5.1)
SODIUM: 135 mmol/L (ref 135–145)
SODIUM: 136 mmol/L (ref 135–145)
SODIUM: 136 mmol/L (ref 135–145)
Sodium: 137 mmol/L (ref 135–145)
Sodium: 136 mmol/L (ref 135–145)

## 2017-03-14 LAB — CK
CK TOTAL: 7822 U/L — AB (ref 49–397)
CK TOTAL: 3652 U/L — AB (ref 49–397)

## 2017-03-14 LAB — MAGNESIUM
MAGNESIUM: 2 mg/dL (ref 1.7–2.4)
Magnesium: 2 mg/dL (ref 1.7–2.4)
Magnesium: 1.9 mg/dL (ref 1.7–2.4)
Magnesium: 1.8 mg/dL (ref 1.7–2.4)
Magnesium: 1.9 mg/dL (ref 1.7–2.4)

## 2017-03-14 LAB — CBC
HCT: 33.8 % — ABNORMAL LOW (ref 40.0–52.0)
Hemoglobin: 11.3 g/dL — ABNORMAL LOW (ref 13.0–18.0)
MCH: 27.5 pg (ref 26.0–34.0)
MCHC: 33.3 g/dL (ref 32.0–36.0)
MCV: 82.5 fL (ref 80.0–100.0)
PLATELETS: 153 10*3/uL (ref 150–440)
RBC: 4.1 MIL/uL — AB (ref 4.40–5.90)
RDW: 14.9 % — ABNORMAL HIGH (ref 11.5–14.5)
WBC: 13.2 10*3/uL — AB (ref 3.8–10.6)

## 2017-03-14 LAB — HEPATIC FUNCTION PANEL
ALT: 772 U/L — ABNORMAL HIGH (ref 17–63)
AST: 282 U/L — AB (ref 15–41)
Albumin: 1.7 g/dL — ABNORMAL LOW (ref 3.5–5.0)
Alkaline Phosphatase: 132 U/L — ABNORMAL HIGH (ref 38–126)
BILIRUBIN DIRECT: 0.2 mg/dL (ref 0.1–0.5)
BILIRUBIN INDIRECT: 0.6 mg/dL (ref 0.3–0.9)
Total Bilirubin: 0.8 mg/dL (ref 0.3–1.2)
Total Protein: 5.1 g/dL — ABNORMAL LOW (ref 6.5–8.1)

## 2017-03-14 LAB — HEPARIN LEVEL (UNFRACTIONATED): Heparin Unfractionated: 0.51 IU/mL (ref 0.30–0.70)

## 2017-03-14 LAB — APTT: APTT: 73 s — AB (ref 24–36)

## 2017-03-14 MED ORDER — VITAL HIGH PROTEIN PO LIQD
1000.0000 mL | ORAL | Status: DC
  Administered 2017-03-14: 1000 mL

## 2017-03-14 MED ORDER — VANCOMYCIN HCL 10 G IV SOLR
1500.0000 mg | INTRAVENOUS | Status: DC
  Administered 2017-03-14 – 2017-03-15 (×2): 1500 mg via INTRAVENOUS
  Filled 2017-03-14 (×3): qty 1500

## 2017-03-14 MED ORDER — PRO-STAT SUGAR FREE PO LIQD
30.0000 mL | Freq: Two times a day (BID) | ORAL | Status: DC
  Administered 2017-03-14 – 2017-03-15 (×3): 30 mL

## 2017-03-14 NOTE — Progress Notes (Signed)
Subjective: Off sedation and pressors this AM  Objective: Current vital signs: BP 135/85   Pulse (!) 124   Temp (!) 97.3 F (36.3 C)   Resp 12   Ht _0  (1.753 m)   Wt (!) 362 lb 14 oz (164.6 kg)   SpO2 94%   BMI 53.59 kg/m  Vital signs in last 24 hours: Temp:  [97.2 F (36.2 C)-99 F (37.2 C)] 97.3 F (36.3 C) (01/13 1000) Pulse Rate:  [98-124] 124 (01/13 1000) Resp:  [10-21] 12 (01/13 1000) BP: (89-147)/(48-91) 135/85 (01/13 1000) SpO2:  [93 %-100 %] 94 % (01/13 1000) FiO2 (%):  [40 %] 40 % (01/13 0800) Weight:  [362 lb 14 oz (164.6 kg)] 362 lb 14 oz (164.6 kg) (01/13 0500)  Intake/Output from previous day: 01/12 0701 - 01/13 0700 In: 5596.9 [I.V.:4321.9; IV Piggyback:1275] Out: 093 [Urine:125] Intake/Output this shift: Total I/O In: 636.5 [I.V.:636.5] Out: 49 [Urine:1; Other:48] Nutritional status: Diet NPO time specified  Neurologic Exam: Mental Status: Withdraws from painful stimuli  Cranial Nerves: II: patient does not respond confrontation bilaterally, pupils right 3 mm, left 3 mm,and reactive bilaterally III,IV,VI: doll's response absent bilaterally. V,VII: corneal reflex reduced bilaterally  VIII: patient does not respond to verbal stimuli IX,X: gag reflex reduced, XI: trapezius strength unable to test bilaterally XII: tongue strength unable to test Motor: Extremities flaccid throughout.  No spontaneous movement noted.  No purposeful movements noted. Sensory: Does not respond to noxious stimuli in any extremity.   Lab Results: Basic Metabolic Panel: Recent Labs  Lab 03/13/17 1503 03/13/17 1841 03/13/17 2226 03/14/17 0304 03/14/17 0634  NA 138 137 136 137 135  K 3.7 3.7 4.2 4.2 3.9  CL 102 101 99* 100* 99*  CO2 _1 GLUCOSE 161* 154* 208* 244* 209*  BUN 26* 26* 26* 26* 26*  CREATININE 2.59* 2.50* 2.45* 2.58* 2.66*  CALCIUM 7.0* 6.9* 7.0* 6.8* 7.1*  MG 2.2 2.0 2.0 2.0 1.9  PHOS 3.3 3.3 3.6 3.3 2.9    Liver Function  Tests: Recent Labs  Lab 03/02/2017 2012 03/11/17 0428  03/12/17 1244  03/13/17 1503 03/13/17 1841 03/13/17 2226 03/14/17 0304 03/14/17 0634  AST 4,855* >10,000*  --  2,115*  --   --   --   --   --   --   ALT 2,250* 3,543*  --  2,181*  --   --   --   --   --   --   ALKPHOS 279*  --   --  110  --   --   --   --   --   --   BILITOT 1.2  --   --  0.6  --   --   --   --   --   --   PROT 8.3*  --   --  4.6*  --   --   --   --   --   --   ALBUMIN 3.6  --    < > 1.8*  1.8*   < > 1.9* 1.9* 1.8* 1.7* 1.6*   < > = values in this interval not displayed.   Recent Labs  Lab 03/11/17 1543  LIPASE 1,539*  AMYLASE 789*   No results for input(s): AMMONIA in the last 168 hours.  CBC: Recent Labs  Lab 03/06/2017 2012 03/11/17 0428 03/12/17 0533 03/13/17 0359 03/14/17 0514  WBC 13.4* 14.4* 13.1* 18.0* 13.2*  NEUTROABS 11.5*  --  11.2*  --   --  HGB 15.5 14.7 13.0 13.9 11.3*  HCT 54.0* 46.6 40.5 42.3 33.8*  MCV 94.1 86.2 83.9 83.2 82.5  PLT 379 355 224 226 153    Cardiac Enzymes: Recent Labs  Lab 03/11/17 0113 03/11/17 0840 03/11/17 1241  03/12/17 0321 03/12/17 1524 03/13/17 0359 03/13/17 1503 03/14/17 0304  CKTOTAL  --   --   --    < > >50,000* 35,820* 19,835* 11,678* 7,822*  TROPONINI 0.67* 1.46* 1.35*  --   --   --   --   --   --    < > = values in this interval not displayed.    Lipid Panel: No results for input(s): CHOL, TRIG, HDL, CHOLHDL, VLDL, LDLCALC in the last 168 hours.  CBG: Recent Labs  Lab 03/14/17 0509 03/14/17 0612 03/14/17 0719 03/14/17 0816 03/14/17 0920  GLUCAP 228* 186* 170* 164* 179*    Microbiology: Results for orders placed or performed during the hospital encounter of 03/09/2017  Urine culture     Status: None   Collection Time: 03/12/2017  8:12 PM  Result Value Ref Range Status   Specimen Description   Final    URINE, RANDOM Performed at [facility], [address], [place], [place] [address]    Special Requests   Final     NONE Performed at [facility], [address], [place], [place] [address]    Culture   Final    NO GROWTH Performed at [facility], [place] [address] [place], [place] [address]    Report Status 03/12/2017 FINAL  Final  Blood Culture (routine x 2)     Status: None (Preliminary result)   Collection Time: 03/09/2017 11:28 PM  Result Value Ref Range Status   Specimen Description BLOOD LT [facility]  Final   Special Requests   Final    BOTTLES DRAWN AEROBIC AND ANAEROBIC Blood Culture adequate volume   Culture   Final    NO GROWTH 4 DAYS Performed at [facility], [address] [place], [place] [address]    Report Status PENDING  Incomplete  Blood Culture (routine x 2)     Status: None (Preliminary result)   Collection Time: 03/11/2017 11:28 PM  Result Value Ref Range Status   Specimen Description BLOOD RT [facility]  Final   Special Requests   Final    BOTTLES DRAWN AEROBIC AND ANAEROBIC Blood Culture adequate volume   Culture   Final    NO GROWTH 4 DAYS Performed at [facility], [address] [place], [place] [address]    Report Status PENDING  Incomplete  MRSA PCR Screening     Status: Abnormal   Collection Time: 03/11/17 12:20 AM  Result Value Ref Range Status   MRSA by PCR POSITIVE (A) NEGATIVE Final    Comment:        The GeneXpert MRSA Assay (FDA approved for NASAL specimens only), is one component of a comprehensive MRSA colonization surveillance program. It is not intended to diagnose MRSA infection nor to guide or monitor treatment for MRSA infections. RESULT CALLED TO, READ BACK BY AND VERIFIED WITH: BARBARA THAO ON 03/11/17 AT 0310 JAG Performed at [facility], [address] [place],  [address]   Aerobic/Anaerobic Culture (surgical/deep wound)     Status: None (Preliminary result)   Collection Time: 03/11/17 12:53 AM  Result Value Ref Range Status   Specimen Description   Final    ANKLE RIGHT ANKLE Performed at  [facility], [address] [place], [place] [address]    Special Requests   Final    NONE Performed at [facility], [address] [place], [place] [address]    Gram Stain   Final    RARE WBC PRESENT, PREDOMINANTLY PMN FEW GRAM POSITIVE RODS RARE GRAM POSITIVE COCCI IN PAIRS RARE YEAST    Culture   Final    ABUNDANT STAPHYLOCOCCUS AUREUS MODERATE GROUP B STREP(S.AGALACTIAE)ISOLATED TESTING AGAINST S. AGALACTIAE NOT ROUTINELY PERFORMED DUE TO PREDICTABILITY OF AMP/PEN/VAN SUSCEPTIBILITY. FEW YEAST HOLDING FOR POSSIBLE ANAEROBE Performed at [facility], [place] [address], [place], [place] [address]    Report Status PENDING  Incomplete    Coagulation Studies: Recent Labs    03/11/17 1116 03/13/17 0359  LABPROT 16.5* 15.3*  INR 1.34 1.22    Imaging: Dg Abd 1 View  Result Date: 03/12/2017 CLINICAL DATA:  OG tube placement. EXAM: ABDOMEN - 1 VIEW COMPARISON:  03/11/2017. FINDINGS: OG tube noted with tip below left hemidiaphragm. No bowel distention. IMPRESSION: OG tube noted with tip below left hemidiaphragm. Electronically Signed   By: Marcello Moores  Register   On: 03/12/2017 15:54   Ct Head Wo Contrast  Result Date: 03/13/2017 CLINICAL DATA:  37 year old male with seizure. EXAM: CT HEAD WITHOUT CONTRAST TECHNIQUE: Contiguous axial images were obtained from the base of the skull through the vertex without intravenous contrast. COMPARISON:  Head CT dated 03/18/2017 FINDINGS: Brain: The ventricles and sulci appropriate size for patient's age. Focal areas of low attenuation medial to the lentiform nuclei and in the region of the internal capsule bilaterally appear new compared to the prior CT study. Further evaluation with MRI is recommended. There is no acute intracranial hemorrhage. No mass effect or midline shift. No extra-axial fluid collection. Vascular: No hyperdense vessel or unexpected calcification. Skull: Normal. Negative for fracture or focal lesion.  Sinuses/Orbits: There is diffuse mucoperiosteal thickening of paranasal sinuses with opacification of the multiple ethmoid air cells and left maxillary sinus. The mastoid air cells are clear. Other: Diffuse scalp edema or serosanguineous fluid. Clinical correlation is recommended. IMPRESSION: 1. Focal areas of low attenuation in the internal capsule medial to the lentiform nuclei bilaterally, new compared to the prior CT and may represent ischemia. Further evaluation with MRI recommended. 2. No acute intracranial hemorrhage. 3. Paranasal sinus disease. 4. Diffuse scalp edema. These results were called by telephone at the time of interpretation on 03/13/2017 at 12:07 am to nurse practitioner MAGADALENE TUKOV , who verbally acknowledged these results. Electronically Signed   By: Anner Crete M.D.   On: 03/13/2017 00:17    Medications:  I have reviewed the patient's current medications. Scheduled: . alteplase  2 mg Intracatheter Once  . alteplase  2 mg Intracatheter Once  . chlorhexidine gluconate (MEDLINE KIT)  15 mL Mouth Rinse BID  . Chlorhexidine Gluconate Cloth  6 each Topical Q0600  . feeding supplement (PRO-STAT SUGAR FREE 64)  30 mL Per Tube BID  . feeding supplement (VITAL HIGH PROTEIN)  1,000 mL Per Tube Q24H  . hydrocortisone sod succinate (SOLU-CORTEF) inj  50 mg Intravenous Q6H  . mouth rinse  15 mL Mouth Rinse 10 times per day  . mupirocin ointment  1 application Nasal BID    Assessment/Plan: Off pressors and sedation this AM.  Has multisystem failure.  On broad-spectrum antibiotics due to inability to determine source at this time.  Patient also on heparin for left upper extremity DVT Had seizures last evening which are  likely related to his encephalopathy and metabolic problems requiring pressors and CCRT. Less likely due to stroke as below since not cortical  - CTH possibly L internal capsule stroke evolution that is new.  - Was thinking of repeating [place] today as off pressors  but would rather have MRI done tomorrow  - No further seizures con't Keppra 500 BID with CCRT  - Pt does withdraws from painful stimuli, but does not follow commands.    LOS: 4 days    03/14/2017  10:24 AM

## 2017-03-14 NOTE — Progress Notes (Signed)
Decreased to 30% FIO2.

## 2017-03-14 NOTE — Progress Notes (Signed)
Increased to 40% due to sats of 90%.

## 2017-03-14 NOTE — Progress Notes (Signed)
ANTICOAGULATION CONSULT NOTE  Pharmacy Consult for Heparin Indication: upper extremity DVT  Allergies  Allergen Reactions  . Naproxen   . Sulfa Antibiotics Hives    Patient Measurements: Height: 5\' 9"  (175.3 cm) Weight: (!) 362 lb 14 oz (164.6 kg) IBW/kg (Calculated) : 70.7 Heparin Dosing Weight: 105 kg  Vital Signs: Temp: 97.2 F (36.2 C) (01/13 0530) BP: 126/72 (01/13 0530) Pulse Rate: 110 (01/13 0530)  Labs: Recent Labs    03/11/17 0840  03/11/17 1116 03/11/17 1241  03/12/17 0533  03/12/17 1936  03/13/17 0359  03/13/17 1503 03/13/17 1841 03/13/17 2226 03/14/17 0304 03/14/17 0514  HGB  --   --   --   --    < > 13.0  --   --   --  13.9  --   --   --   --   --  11.3*  HCT  --   --   --   --   --  40.5  --   --   --  42.3  --   --   --   --   --  33.8*  PLT  --   --   --   --   --  224  --   --   --  226  --   --   --   --   --  153  APTT  --    < >  --   --   --  38*  --   --   --  43*  --   --   --   --   --  73*  LABPROT  --   --  16.5*  --   --   --   --   --   --  15.3*  --   --   --   --   --   --   INR  --   --  1.34  --   --   --   --   --   --  1.22  --   --   --   --   --   --   HEPARINUNFRC  --   --   --   --    < >  --    < > 0.52  --  0.43  --   --   --   --   --  0.51  CREATININE 2.94*  --   --  2.96*   < >  --    < >  --    < > 2.56*   < > 2.59* 2.50* 2.45* 2.58*  --   CKTOTAL  --   --   --   --    < >  --    < >  --   --  06,301*  --  11,678*  --   --  7,822*  --   TROPONINI 1.46*  --   --  1.35*  --   --   --   --   --   --   --   --   --   --   --   --    < > = values in this interval not displayed.    Estimated Creatinine Clearance: 60.6 mL/min (A) (by C-G formula based on SCr of 2.58 mg/dL (H)).   Medical History: Past Medical History:  Diagnosis Date  . Back pain   .  Diabetes 1.5, managed as type 1 (HCC)   . Legg-Calve-Perthes disease   . Neuropathic pain   . Obesity   . Osteoarthritis     Assessment: 37 y/o M admitted with  unresponsiveness and ventilated with DKA and found to have upper extremity DVT.   HL = 0.52, therapeutic.   Goal of Therapy:  Heparin level 0.3-0.7 units/ml Monitor platelets by anticoagulation protocol: Yes   Plan:  Heparin level was drawn late but is therapeutic. Will order another heparin level in 6 hours to confirm that heparin does not need to be adjusted.   Yolanda Bonine, PharmD Pharmacy Resident 03/14/2017 6:31 AM    01/12 AM heparin level 0.43. Continue current regimen. Recheck heparin level and CBC with tomorrow AM labs.  01/13 AM heparin level 0.51. Continue current regimen. Recheck heparin level and CBC with tomorrow AM labs.   Fulton Reek, PharmD, BCPS  03/14/17 6:31 AM   \

## 2017-03-14 NOTE — Progress Notes (Signed)
CRRT stopped per MD Kolluru. HD to be started 03/15/2017. Will continue to monitor patient.

## 2017-03-14 NOTE — Progress Notes (Signed)
[facility] [place] at [facility]   PATIENT NAME: Zachary Graves    MR#:  [id number]  DATE OF BIRTH:  Jan 26, 1981  SUBJECTIVE:   Patient remains intubated and sedated and critically ill. Off vasopressors now. Patient withdraws to painful stimuli. Seen by neurology and plan for MRI tomorrow.  REVIEW OF SYSTEMS:    Review of Systems  Unable to perform ROS: Intubated    Nutrition: Tube feeds Tolerating Diet: Yes Tolerating PT: Await Eval once Extubated.   DRUG ALLERGIES:   Allergies  Allergen Reactions  . Naproxen   . Sulfa Antibiotics Hives    VITALS:  Blood pressure (!) 145/96, pulse (!) 118, temperature (!) 97.5 F (36.4 C), resp. rate 18, height 5\' 9"  (1.753 m), weight (!) 164.6 kg (362 lb 14 oz), SpO2 95 %.  PHYSICAL EXAMINATION:   Physical Exam  GENERAL:  37 y.o.-year-old obese patient lying in bed sedated & intubated.  EYES: Pupils equal, round, reactive to light. No scleral icterus. HEENT: Head atraumatic, normocephalic. ET and OG tubes in place.  NECK:  Supple, no jugular venous distention. No thyroid enlargement, no tenderness.  LUNGS: Normal breath sounds bilaterally, no wheezing, rales, rhonchi. No use of accessory muscles of respiration.  CARDIOVASCULAR: S1, S2 normal. No murmurs, rubs, or gallops.  ABDOMEN: Soft, nontender, nondistended. Bowel sounds present. No organomegaly or mass.  EXTREMITIES: No cyanosis, clubbing or edema b/l.   Left upper ext. Edema > right. Anasarcic.  NEUROLOGIC: Sedated & Intubated  PSYCHIATRIC: Sedated and Intubated.  SKIN: No obvious rash, lesion, or ulcer. Sign of chronic venous stasis and some dry excoriating areas in the left foot.   LABORATORY PANEL:   CBC Recent Labs  Lab 03/14/17 0514  WBC 13.2*  HGB 11.3*  HCT 33.8*  PLT 153   ------------------------------------------------------------------------------------------------------------------  Chemistries  Recent Labs  Lab 03/14/17 1039  NA  136  K 4.1  CL 99*  CO2 28  GLUCOSE 200*  BUN 25*  CREATININE 2.67*  CALCIUM 7.1*  MG 1.8  AST 282*  ALT 772*  ALKPHOS 132*  BILITOT 0.8   ------------------------------------------------------------------------------------------------------------------  Cardiac Enzymes Recent Labs  Lab 03/11/17 1241  TROPONINI 1.35*   ------------------------------------------------------------------------------------------------------------------  RADIOLOGY:  Dg Abd 1 View  Result Date: 03/12/2017 CLINICAL DATA:  OG tube placement. EXAM: ABDOMEN - 1 VIEW COMPARISON:  03/11/2017. FINDINGS: OG tube noted with tip below left hemidiaphragm. No bowel distention. IMPRESSION: OG tube noted with tip below left hemidiaphragm. Electronically Signed   By: Maisie Fus  Register   On: 03/12/2017 15:54   Ct Head Wo Contrast  Result Date: 03/13/2017 CLINICAL DATA:  37 year old male with seizure. EXAM: CT HEAD WITHOUT CONTRAST TECHNIQUE: Contiguous axial images were obtained from the base of the skull through the vertex without intravenous contrast. COMPARISON:  Head CT dated 04-07-17 FINDINGS: Brain: The ventricles and sulci appropriate size for patient's age. Focal areas of low attenuation medial to the lentiform nuclei and in the region of the internal capsule bilaterally appear new compared to the prior CT study. Further evaluation with MRI is recommended. There is no acute intracranial hemorrhage. No mass effect or midline shift. No extra-axial fluid collection. Vascular: No hyperdense vessel or unexpected calcification. Skull: Normal. Negative for fracture or focal lesion. Sinuses/Orbits: There is diffuse mucoperiosteal thickening of paranasal sinuses with opacification of the multiple ethmoid air cells and left maxillary sinus. The mastoid air cells are clear. Other: Diffuse scalp edema or serosanguineous fluid. Clinical correlation is recommended. IMPRESSION: 1. Focal areas  of low attenuation in the internal  capsule medial to the lentiform nuclei bilaterally, new compared to the prior CT and may represent ischemia. Further evaluation with MRI recommended. 2. No acute intracranial hemorrhage. 3. Paranasal sinus disease. 4. Diffuse scalp edema. These results were called by telephone at the time of interpretation on 03/13/2017 at 12:07 am to nurse practitioner MAGADALENE TUKOV , who verbally acknowledged these results. Electronically Signed   By: Elgie Collard M.D.   On: 03/13/2017 00:17   Dg Chest Port 1 View  Result Date: 03/14/2017 CLINICAL DATA:  Ventilator EXAM: PORTABLE CHEST 1 VIEW COMPARISON:  03/12/2017 FINDINGS: Low lung volumes with bibasilar atelectasis. Endotracheal tube, right central line are unchanged. Interval placement of NG tube into the stomach. Heart is borderline in size. No effusions. IMPRESSION: Low lung volumes, bibasilar atelectasis. Electronically Signed   By: Charlett Nose M.D.   On: 03/14/2017 11:23     ASSESSMENT AND PLAN:   37 year old male with past medical history of morbid obesity, diabetes, medical noncompliance, diabetic neuropathy who presented to the hospital due to altered mental status and noted to be in acute rhabdomyolysis and also acute diabetic ketoacidosis.  1. Altered mental status/encephalopathy-patient is currently intubated and sedated. -CT head although was positive for possible subacute CVA. Seen by neurology and plan for doing an MRI tomorrow as patient is off vasopressors now. -Continue Keppra, patient withdraws to painful stimuli. -Follow mental status once patient is extubated.  2. Sepsis-no clear source identified. Continue broad-spectrum IV antibiotics with vancomycin, cefepime. Patient also being empirically treated with encephalitis with acyclovir. -Cultures so far remain negative. Continue stress dose steroids, and pt. Is off vasopressors now.  -Follow hemodynamics.  Pt's right ankle wound cultures are + for MRSA but BC remain (-).   3. Acute  diabetic ketoacidosis-secondary to patient's noncompliance. -Improved with IV fluids, insulin drip. Anion gap is currently closed. Continue on insulin drip per ICU protocol. Follow blood sugars which are stable so far.   4. Acute renal failure-secondary to sepsis. Patient remains oliguric, seen by nephrology, continue CRRT for now and possible transitional to intermittent HD tomorrow.  - Renal dose meds, avoid nephrotoxins. Follow urine output.  5. Left upper extremity DVT-continue heparin drip.  6. Acute rhabdomyolysis-continue IV fluid hydration, sodium bicarbonate. -CKs trending down.  7. Abnormal LFTs-secondary to shock liver from septic shock and multiorgan failure. - LFT's improving.   Patient is critically ill with multiorgan failure. Prognosis remains guarded.   All the records are reviewed and case discussed with Care Management/Social Worker. Management plans discussed with the patient, family and they are in agreement.  CODE STATUS: Full  DVT Prophylaxis: Heparin gtt  TOTAL TIME TAKING CARE OF THIS PATIENT: 30 minutes.   POSSIBLE D/C unclear, DEPENDING ON CLINICAL CONDITION.   Houston Siren M.D on 03/14/2017 at 2:17 PM  Between 7am to 6pm - Pager - [phone]  After 6pm go to [email or web address] - Social research officer, government  [facility] [place] [facility]  Office  [phone]  CC: Primary care physician; Patient, No Pcp Per

## 2017-03-14 NOTE — Progress Notes (Signed)
[facility]  ROUNDING NOTE   Subjective:   CRRT - tolerating well.  UF of 738 - next +4733 in last 24 hours.   Off vasopressors.   No more seizures  Afebrile   CK 7822 (19835) (87867)  Objective:  Vital signs in last 24 hours:  Temp:  [97.2 F (36.2 C)-99 F (37.2 C)] 97.2 F (36.2 C) (01/13 0900) Pulse Rate:  [98-122] 118 (01/13 0900) Resp:  [10-21] 18 (01/13 0900) BP: (89-147)/(48-91) 130/80 (01/13 0900) SpO2:  [93 %-100 %] 93 % (01/13 0900) FiO2 (%):  [40 %] 40 % (01/13 0800) Weight:  [164.6 kg (362 lb 14 oz)] 164.6 kg (362 lb 14 oz) (01/13 0500)  Weight change: 4 kg (8 lb 13.1 oz) Filed Weights   03/12/17 0500 03/13/17 0500 03/14/17 0500  Weight: (!) 157.7 kg (347 lb 10.7 oz) (!) 160.6 kg (354 lb 0.9 oz) (!) 164.6 kg (362 lb 14 oz)    Intake/Output: I/O last 3 completed shifts: In: 12093.3 [I.V.:9278.3; IV Piggyback:2815] Out: 6720 [Urine:425; Other:738]   Intake/Output this shift:  Total I/O In: 525.3 [I.V.:525.3] Out: 49 [Urine:1; Other:48]  Physical Exam: General: Critically ill  Head: Left parietal ecchymosis, ETT, OGT  Eyes: PERRLA  Neck: RIJ temp HD catheter  Lungs:  PRVC 40%  Heart: tachycardia  Abdomen:  Soft, nontender, obese  Extremities: + peripheral edema. Left upper extremity edema  Neurologic: Intubated, sedated  Skin: Multiple eccymosis  Access: RIJ temp HD catheter 9/47    Basic Metabolic Panel: Recent Labs  Lab 03/13/17 1503 03/13/17 1841 03/13/17 2226 03/14/17 0304 03/14/17 0634  NA 138 137 136 137 135  K 3.7 3.7 4.2 4.2 3.9  CL 102 101 99* 100* 99*  CO2 25 28 27 28 27   GLUCOSE 161* 154* 208* 244* 209*  BUN 26* 26* 26* 26* 26*  CREATININE 2.59* 2.50* 2.45* 2.58* 2.66*  CALCIUM 7.0* 6.9* 7.0* 6.8* 7.1*  MG 2.2 2.0 2.0 2.0 1.9  PHOS 3.3 3.3 3.6 3.3 2.9    Liver Function Tests: Recent Labs  Lab 03/24/2017 2012 03/11/17 0428  03/12/17 1244  03/13/17 1503 03/13/17 1841 03/13/17 2226 03/14/17 0304  03/14/17 0634  AST 4,855* >10,000*  --  2,115*  --   --   --   --   --   --   ALT 2,250* 3,543*  --  2,181*  --   --   --   --   --   --   ALKPHOS 279*  --   --  110  --   --   --   --   --   --   BILITOT 1.2  --   --  0.6  --   --   --   --   --   --   PROT 8.3*  --   --  4.6*  --   --   --   --   --   --   ALBUMIN 3.6  --    < > 1.8*  1.8*   < > 1.9* 1.9* 1.8* 1.7* 1.6*   < > = values in this interval not displayed.   Recent Labs  Lab 03/11/17 1543  LIPASE 1,539*  AMYLASE 789*   No results for input(s): AMMONIA in the last 168 hours.  CBC: Recent Labs  Lab 03/02/2017 2012 03/11/17 0428 03/12/17 0533 03/13/17 0359 03/14/17 0514  WBC 13.4* 14.4* 13.1* 18.0* 13.2*  NEUTROABS 11.5*  --  11.2*  --   --  HGB 15.5 14.7 13.0 13.9 11.3*  HCT 54.0* 46.6 40.5 42.3 33.8*  MCV 94.1 86.2 83.9 83.2 82.5  PLT 379 355 224 226 153    Cardiac Enzymes: Recent Labs  Lab 03/11/17 0113 03/11/17 0840 03/11/17 1241  03/12/17 0321 03/12/17 1524 03/13/17 0359 03/13/17 1503 03/14/17 0304  CKTOTAL  --   --   --    < > >50,000* 35,820* 19,835* 11,678* 7,822*  TROPONINI 0.67* 1.46* 1.35*  --   --   --   --   --   --    < > = values in this interval not displayed.    BNP: Invalid input(s): POCBNP  CBG: Recent Labs  Lab 03/14/17 0509 03/14/17 0612 03/14/17 0719 03/14/17 0816 03/14/17 0920  GLUCAP 228* 186* 170* 164* 179*    Microbiology: Results for orders placed or performed during the hospital encounter of 04/01/2017  Urine culture     Status: None   Collection Time: 03/28/2017  8:12 PM  Result Value Ref Range Status   Specimen Description   Final    URINE, RANDOM Performed at [facility], [address] [place], [place] [address]    Special Requests   Final    NONE Performed at [facility], [address] [place], [place] [address]    Culture   Final    NO GROWTH Performed at [facility], [place] [address], [place], [place] [address]     Report Status 03/12/2017 FINAL  Final  Blood Culture (routine x 2)     Status: None (Preliminary result)   Collection Time: 03/09/2017 11:28 PM  Result Value Ref Range Status   Specimen Description BLOOD LT [facility]  Final   Special Requests   Final    BOTTLES DRAWN AEROBIC AND ANAEROBIC Blood Culture adequate volume   Culture   Final    NO GROWTH 4 DAYS Performed at [facility], [address] [place], [place] [address]    Report Status PENDING  Incomplete  Blood Culture (routine x 2)     Status: None (Preliminary result)   Collection Time: 03/23/2017 11:28 PM  Result Value Ref Range Status   Specimen Description BLOOD RT [facility]  Final   Special Requests   Final    BOTTLES DRAWN AEROBIC AND ANAEROBIC Blood Culture adequate volume   Culture   Final    NO GROWTH 4 DAYS Performed at [facility], [address], [place], [place] [address]    Report Status PENDING  Incomplete  MRSA PCR Screening     Status: Abnormal   Collection Time: 03/11/17 12:20 AM  Result Value Ref Range Status   MRSA by PCR POSITIVE (A) NEGATIVE Final    Comment:        The GeneXpert MRSA Assay (FDA approved for NASAL specimens only), is one component of a comprehensive MRSA colonization surveillance program. It is not intended to diagnose MRSA infection nor to guide or monitor treatment for MRSA infections. RESULT CALLED TO, READ BACK BY AND VERIFIED WITH: BARBARA THAO ON 03/11/17 AT 0310 JAG Performed at [facility], [address] [place], [place] [address]   Aerobic/Anaerobic Culture (surgical/deep wound)     Status: None (Preliminary result)   Collection Time: 03/11/17 12:53 AM  Result Value Ref Range Status   Specimen Description   Final    ANKLE RIGHT ANKLE Performed at [facility], [address], [place], [place] [address]    Special Requests  Final    NONE Performed at [facility], [address] [place], [place] [address]    Gram Stain    Final    RARE WBC PRESENT, PREDOMINANTLY PMN FEW GRAM POSITIVE RODS RARE GRAM POSITIVE COCCI IN PAIRS RARE YEAST    Culture   Final    ABUNDANT STAPHYLOCOCCUS AUREUS MODERATE GROUP B STREP(S.AGALACTIAE)ISOLATED TESTING AGAINST S. AGALACTIAE NOT ROUTINELY PERFORMED DUE TO PREDICTABILITY OF AMP/PEN/VAN SUSCEPTIBILITY. FEW YEAST HOLDING FOR POSSIBLE ANAEROBE Performed at [facility], [place] [address] [place], [place] [address]    Report Status PENDING  Incomplete    Coagulation Studies: Recent Labs    03/11/17 1116 03/13/17 0359  LABPROT 16.5* 15.3*  INR 1.34 1.22    Urinalysis: No results for input(s): COLORURINE, LABSPEC, PHURINE, GLUCOSEU, HGBUR, BILIRUBINUR, KETONESUR, PROTEINUR, UROBILINOGEN, NITRITE, LEUKOCYTESUR in the last 72 hours.  Invalid input(s): APPERANCEUR    Imaging: Dg Abd 1 View  Result Date: 03/12/2017 CLINICAL DATA:  OG tube placement. EXAM: ABDOMEN - 1 VIEW COMPARISON:  03/11/2017. FINDINGS: OG tube noted with tip below left hemidiaphragm. No bowel distention. IMPRESSION: OG tube noted with tip below left hemidiaphragm. Electronically Signed   By: Marcello Moores  Register   On: 03/12/2017 15:54   Ct Head Wo Contrast  Result Date: 03/13/2017 CLINICAL DATA:  37 year old male with seizure. EXAM: CT HEAD WITHOUT CONTRAST TECHNIQUE: Contiguous axial images were obtained from the base of the skull through the vertex without intravenous contrast. COMPARISON:  Head CT dated 03/22/2017 FINDINGS: Brain: The ventricles and sulci appropriate size for patient's age. Focal areas of low attenuation medial to the lentiform nuclei and in the region of the internal capsule bilaterally appear new compared to the prior CT study. Further evaluation with MRI is recommended. There is no acute intracranial hemorrhage. No mass effect or midline shift. No extra-axial fluid collection. Vascular: No hyperdense vessel or unexpected calcification. Skull: Normal. Negative for fracture or  focal lesion. Sinuses/Orbits: There is diffuse mucoperiosteal thickening of paranasal sinuses with opacification of the multiple ethmoid air cells and left maxillary sinus. The mastoid air cells are clear. Other: Diffuse scalp edema or serosanguineous fluid. Clinical correlation is recommended. IMPRESSION: 1. Focal areas of low attenuation in the internal capsule medial to the lentiform nuclei bilaterally, new compared to the prior CT and may represent ischemia. Further evaluation with MRI recommended. 2. No acute intracranial hemorrhage. 3. Paranasal sinus disease. 4. Diffuse scalp edema. These results were called by telephone at the time of interpretation on 03/13/2017 at 12:07 am to nurse practitioner MAGADALENE TUKOV , who verbally acknowledged these results. Electronically Signed   By: Anner Crete M.D.   On: 03/13/2017 00:17     Medications:   . sodium chloride    . acyclovir Stopped (03/13/17 2306)  . cefTRIAXone (ROCEPHIN)  IV Stopped (03/14/17 0133)  . dextrose 5 % and 0.45% NaCl    . famotidine (PEPCID) IV Stopped (03/14/17 0030)  . fentaNYL Stopped (03/14/17 0840)  . heparin 2,400 Units/hr (03/14/17 0038)  . insulin (NOVOLIN-R) infusion 15 Units/hr (03/14/17 0912)  . levETIRAcetam Stopped (03/14/17 0215)  . midazolam (VERSED) infusion Stopped (03/14/17 0840)  . norepinephrine (LEVOPHED) Adult infusion Stopped (03/13/17 1807)  . pureflow 1,500 mL (03/14/17 0109)  .  sodium bicarbonate  infusion 1000 mL 100 mL/hr at 03/14/17 0541  . sodium chloride     And  . sodium chloride     And  . sodium chloride     And  . sodium  chloride     And  . sodium chloride    . vancomycin Stopped (03/13/17 1716)  . vasopressin (PITRESSIN) infusion - *FOR SHOCK* Stopped (03/14/17 0630)   . alteplase  2 mg Intracatheter Once  . alteplase  2 mg Intracatheter Once  . chlorhexidine gluconate (MEDLINE KIT)  15 mL Mouth Rinse BID  . Chlorhexidine Gluconate Cloth  6 each Topical Q0600  .  hydrocortisone sod succinate (SOLU-CORTEF) inj  50 mg Intravenous Q6H  . mouth rinse  15 mL Mouth Rinse 10 times per day  . mupirocin ointment  1 application Nasal BID   sodium chloride, acetaminophen, heparin, midazolam, ondansetron (ZOFRAN) IV  Assessment/ Plan:  Mr. Osha Errico is a 37 y.o. white male with insulin dependent diabetes mellitus, hypertension, morbid obesity, diabetic peripheral neuropathy, Legg-calve-Perthes Disease, osteoarthritis, who was admitted to [facility] on 03/30/2017 for diabetic ketoacidosis  1. Acute Renal Failure: nonoliguric urine output. Baseline creatinine of 0.94, normal GFR on 01/08/17.  Requiring CRRT CVVHD therapy rate 1500, BFR 250 - Massive volume overload - Continue ultrafiltration. If tolerates today, will transition to intermittent hemodialysis tomorrow.   2. Diabetic ketoacidosis/metabolic acidosis. Diabetes mellitus type I Insulin Dependent. Poorly controlled, Hemoglobin A1c 15%.  - on DKA protocol. Anion gap closed. Insulin gtt, Sodium bicarb gtt  3. Shock: with suspected sepsis - cultures negative. Afebrile last 24 hours. On empiric ceftriaxone and vancomycin. Systemic steroids.   4. Rhabdomyolysis - peaked CPK >50,000 - Bicarb gtt - CK trending downward.   5. Transaminitis: secondary to shock liver  AST/ALT trending downward        LOS: 4 Milik Gilreath 1/13/20199:45 AM

## 2017-03-14 NOTE — Progress Notes (Signed)
Spoke with Diabetes Coordinator, switched patient to ICU glycemic control on glucostabilizer since DKA resolved. Pt. Remains on insulin drip and every hour CBG's. Will continue to monitor patient.

## 2017-03-14 NOTE — Progress Notes (Deleted)
Pt extubated to 3L [place] per MD order.

## 2017-03-14 NOTE — Progress Notes (Signed)
[facility] Critical Care Medicine Progess Note    SYNOPSIS   37 yo male with a PMH of Osteoarthritis, Chronic Right Foot Ulceration (currently managed by wound clinic at [facility]), Obesity, Neuropathic Pain, Legg-Calve-Perthes Diseases, Type I DM, and Chronic Back Pain.  He presented to [facility] ER via EMS on 01/9 after being found unresponsive on the floor at home by his family.   ASSESSMENT/PLAN   Respiratory failure. Multifactorial etiology, multisystem organ failure secondary to overwhelming sepsis. On pressure regulated volume control. Will obtain chest x-ray and arterial blood gas today. On withdrawal of sedation patient squeeze my hand  Seizure activity. No additional episodes of seizure noted, has been loaded with Keppra. EEG was performed, felt to be toxic metabolic-induced  Septic shock. She has been weaned off of pressors. We'll continue broad-spectrum anabiotic coverage for now  Renal failure. Multiple etiologies include acute tubular necrosis, sepsis, pigment-induced nephropathy secondary to rhabdomyolysis, Will recheck CK and LFTs  Vascular occlusion. Palpable pulses noted on the left radial artery, presently on anticoagulation   Venous thromboembolic disease. Left upper extremity DVT, on systemic heparin.   Transaminitis. Markedly elevated liver function tests, most likely represents shock liver with transaminitis, pending LFTs  Critical care time 45 minutes  VENTILATOR SETTINGS: Vent Mode: PRVC FiO2 (%):  [40 %] 40 % Set Rate:  [18 bmp] 18 bmp Vt Set:  [650 mL] 650 mL PEEP:  [5 cmH20] 5 cmH20 Plateau Pressure:  [20 cmH20-22 cmH20] 20 cmH20  HEMODYNAMICS:    INTAKE / OUTPUT:  Intake/Output Summary (Last 24 hours) at 03/14/2017 0928 Last data filed at 03/14/2017 0912 Gross per 24 hour  Intake 5911.24 ml  Output 912 ml  Net 4999.24 ml     Name: Zachary Graves MRN: [id number] DOB: February 04, 1981    ADMISSION DATE:  03/17/2017  SUBJECTIVE:   Over the last 24  hours patient had 2 episodes of seizure. CT scan of the head was emergently performed to rule out intracranial hemorrhage. Low attenuation area in the internal capsule noted, patient loaded with Keppra and started on a Versed drip for sedation.  VITAL SIGNS: Temp:  [97.2 F (36.2 C)-99.1 F (37.3 C)] 97.2 F (36.2 C) (01/13 0900) Pulse Rate:  [93-122] 118 (01/13 0900) Resp:  [10-21] 18 (01/13 0900) BP: (89-147)/(48-91) 130/80 (01/13 0900) SpO2:  [93 %-100 %] 93 % (01/13 0900) FiO2 (%):  [40 %] 40 % (01/13 0800) Weight:  [164.6 kg (362 lb 14 oz)] 164.6 kg (362 lb 14 oz) (01/13 0500)  PHYSICAL EXAMINATION: Physical Examination:   VS: BP 130/80   Pulse (!) 118   Temp (!) 97.2 F (36.2 C)   Resp 18   Ht 5\' 9"  (1.753 m)   Wt (!) 164.6 kg (362 lb 14 oz)   SpO2 93%   BMI 53.59 kg/m   General Appearance: Patient is unresponsive, on fentanyl, orally intubated, right internal jugular dialysis catheter, left femoral central line Neuro: Nurse relates that patient has had evidence of responding, breathes above the ventilator, has a cough and a gag with pupillary response HEENT: Oral endotracheal tube noted, right internal jugular dialysis catheter, trachea is midline Pulmonary: Coarse rhonchi appreciated right greater than left Cardiovascular regular rate and rhythm Abdomen: Soft exam, positive bowel sounds Extremities: Left hand still reveals absent distal radial pulse, left arm is swollen and hand is swollen with some mottling changes.    LABORATORY PANEL:   CBC Recent Labs  Lab 03/14/17 0514  WBC 13.2*  HGB 11.3*  HCT  33.8*  PLT 153    Chemistries  Recent Labs  Lab 03/12/17 1244  03/14/17 0634  NA 136   < > 135  K 3.6   < > 3.9  CL 100*   < > 99*  CO2 26   < > 27  GLUCOSE 362*   < > 209*  BUN 32*   < > 26*  CREATININE 2.35*   < > 2.66*  CALCIUM 6.3*   < > 7.1*  MG 1.9   < > 1.9  PHOS 4.1   < > 2.9  AST 2,115*  --   --   ALT 2,181*  --   --   ALKPHOS 110  --    --   BILITOT 0.6  --   --    < > = values in this interval not displayed.    Recent Labs  Lab 03/14/17 0306 03/14/17 0356 03/14/17 0509 03/14/17 0612 03/14/17 0719 03/14/17 0816  GLUCAP 199* 184* 228* 186* 170* 164*   Recent Labs  Lab 03/11/17 1630 03/12/17 0553 03/12/17 2308  PHART 7.33* 7.34* 7.41  PCO2ART 39 43 40  PO2ART 84 76* 64*   Recent Labs  Lab 03/28/2017 2012 03/11/17 0428  03/12/17 1244  03/13/17 2226 03/14/17 0304 03/14/17 0634  AST 4,855* >10,000*  --  2,115*  --   --   --   --   ALT 2,250* 3,543*  --  2,181*  --   --   --   --   ALKPHOS 279*  --   --  110  --   --   --   --   BILITOT 1.2  --   --  0.6  --   --   --   --   ALBUMIN 3.6  --    < > 1.8*  1.8*   < > 1.8* 1.7* 1.6*   < > = values in this interval not displayed.    Cardiac Enzymes Recent Labs  Lab 03/11/17 1241  TROPONINI 1.35*    RADIOLOGY:  Dg Abd 1 View  Result Date: 03/12/2017 CLINICAL DATA:  OG tube placement. EXAM: ABDOMEN - 1 VIEW COMPARISON:  03/11/2017. FINDINGS: OG tube noted with tip below left hemidiaphragm. No bowel distention. IMPRESSION: OG tube noted with tip below left hemidiaphragm. Electronically Signed   By: Maisie Fus  Register   On: 03/12/2017 15:54   Ct Head Wo Contrast  Result Date: 03/13/2017 CLINICAL DATA:  37 year old male with seizure. EXAM: CT HEAD WITHOUT CONTRAST TECHNIQUE: Contiguous axial images were obtained from the base of the skull through the vertex without intravenous contrast. COMPARISON:  Head CT dated 03/19/2017 FINDINGS: Brain: The ventricles and sulci appropriate size for patient's age. Focal areas of low attenuation medial to the lentiform nuclei and in the region of the internal capsule bilaterally appear new compared to the prior CT study. Further evaluation with MRI is recommended. There is no acute intracranial hemorrhage. No mass effect or midline shift. No extra-axial fluid collection. Vascular: No hyperdense vessel or unexpected  calcification. Skull: Normal. Negative for fracture or focal lesion. Sinuses/Orbits: There is diffuse mucoperiosteal thickening of paranasal sinuses with opacification of the multiple ethmoid air cells and left maxillary sinus. The mastoid air cells are clear. Other: Diffuse scalp edema or serosanguineous fluid. Clinical correlation is recommended. IMPRESSION: 1. Focal areas of low attenuation in the internal capsule medial to the lentiform nuclei bilaterally, new compared to the prior CT and may represent ischemia. Further evaluation with  MRI recommended. 2. No acute intracranial hemorrhage. 3. Paranasal sinus disease. 4. Diffuse scalp edema. These results were called by telephone at the time of interpretation on 03/13/2017 at 12:07 am to nurse practitioner MAGADALENE TUKOV , who verbally acknowledged these results. Electronically Signed   By: Elgie Collard M.D.   On: 03/13/2017 00:17    Tora Kindred, DO 03/14/2017

## 2017-03-14 NOTE — Progress Notes (Signed)
Patient had to have sedation increased for agitation and restlessness. Had prn Versed x 1. Vasporessin stopped at 0630. No longer on pressors. Continue on CRRT without incident. Insulin adjusted per glucostablizer. On IV steroids.

## 2017-03-15 ENCOUNTER — Inpatient Hospital Stay: Payer: Medicaid Other

## 2017-03-15 DIAGNOSIS — M6282 Rhabdomyolysis: Secondary | ICD-10-CM

## 2017-03-15 DIAGNOSIS — K7290 Hepatic failure, unspecified without coma: Secondary | ICD-10-CM

## 2017-03-15 DIAGNOSIS — E1111 Type 2 diabetes mellitus with ketoacidosis with coma: Secondary | ICD-10-CM

## 2017-03-15 DIAGNOSIS — G9341 Metabolic encephalopathy: Secondary | ICD-10-CM

## 2017-03-15 DIAGNOSIS — J9602 Acute respiratory failure with hypercapnia: Secondary | ICD-10-CM

## 2017-03-15 LAB — APTT: APTT: 64 s — AB (ref 24–36)

## 2017-03-15 LAB — GLUCOSE, CAPILLARY
GLUCOSE-CAPILLARY: 130 mg/dL — AB (ref 65–99)
GLUCOSE-CAPILLARY: 137 mg/dL — AB (ref 65–99)
GLUCOSE-CAPILLARY: 147 mg/dL — AB (ref 65–99)
GLUCOSE-CAPILLARY: 150 mg/dL — AB (ref 65–99)
GLUCOSE-CAPILLARY: 153 mg/dL — AB (ref 65–99)
GLUCOSE-CAPILLARY: 157 mg/dL — AB (ref 65–99)
GLUCOSE-CAPILLARY: 165 mg/dL — AB (ref 65–99)
GLUCOSE-CAPILLARY: 169 mg/dL — AB (ref 65–99)
GLUCOSE-CAPILLARY: 169 mg/dL — AB (ref 65–99)
GLUCOSE-CAPILLARY: 169 mg/dL — AB (ref 65–99)
GLUCOSE-CAPILLARY: 181 mg/dL — AB (ref 65–99)
GLUCOSE-CAPILLARY: 186 mg/dL — AB (ref 65–99)
GLUCOSE-CAPILLARY: 190 mg/dL — AB (ref 65–99)
Glucose-Capillary: 165 mg/dL — ABNORMAL HIGH (ref 65–99)
Glucose-Capillary: 181 mg/dL — ABNORMAL HIGH (ref 65–99)
Glucose-Capillary: 162 mg/dL — ABNORMAL HIGH (ref 65–99)
Glucose-Capillary: 170 mg/dL — ABNORMAL HIGH (ref 65–99)
Glucose-Capillary: 162 mg/dL — ABNORMAL HIGH (ref 65–99)
Glucose-Capillary: 154 mg/dL — ABNORMAL HIGH (ref 65–99)
Glucose-Capillary: 139 mg/dL — ABNORMAL HIGH (ref 65–99)
Glucose-Capillary: 151 mg/dL — ABNORMAL HIGH (ref 65–99)
Glucose-Capillary: 155 mg/dL — ABNORMAL HIGH (ref 65–99)
Glucose-Capillary: 202 mg/dL — ABNORMAL HIGH (ref 65–99)
Glucose-Capillary: 199 mg/dL — ABNORMAL HIGH (ref 65–99)

## 2017-03-15 LAB — COMPREHENSIVE METABOLIC PANEL
ALBUMIN: 1.7 g/dL — AB (ref 3.5–5.0)
ALT: 508 U/L — ABNORMAL HIGH (ref 17–63)
ANION GAP: 9 (ref 5–15)
AST: 169 U/L — AB (ref 15–41)
Alkaline Phosphatase: 135 U/L — ABNORMAL HIGH (ref 38–126)
BILIRUBIN TOTAL: 0.8 mg/dL (ref 0.3–1.2)
BUN: 36 mg/dL — ABNORMAL HIGH (ref 6–20)
CALCIUM: 7 mg/dL — AB (ref 8.9–10.3)
CO2: 30 mmol/L (ref 22–32)
Chloride: 98 mmol/L — ABNORMAL LOW (ref 101–111)
Creatinine, Ser: 3.5 mg/dL — ABNORMAL HIGH (ref 0.61–1.24)
GFR calc Af Amer: 24 mL/min — ABNORMAL LOW (ref >60)
GFR calc non Af Amer: 21 mL/min — ABNORMAL LOW (ref >60)
GLUCOSE: 188 mg/dL — AB (ref 65–99)
Potassium: 3.9 mmol/L (ref 3.5–5.1)
SODIUM: 137 mmol/L (ref 135–145)
TOTAL PROTEIN: 5 g/dL — AB (ref 6.5–8.1)

## 2017-03-15 LAB — CULTURE, BLOOD (ROUTINE X 2)
Culture: NO GROWTH
Culture: NO GROWTH
Special Requests: ADEQUATE
Special Requests: ADEQUATE

## 2017-03-15 LAB — AEROBIC/ANAEROBIC CULTURE (SURGICAL/DEEP WOUND)

## 2017-03-15 LAB — AEROBIC/ANAEROBIC CULTURE W GRAM STAIN (SURGICAL/DEEP WOUND)

## 2017-03-15 LAB — PHOSPHORUS: Phosphorus: 3.2 mg/dL (ref 2.5–4.6)

## 2017-03-15 LAB — CBC
HEMATOCRIT: 31.7 % — AB (ref 40.0–52.0)
HEMOGLOBIN: 10.5 g/dL — AB (ref 13.0–18.0)
MCH: 27.2 pg (ref 26.0–34.0)
MCHC: 33 g/dL (ref 32.0–36.0)
MCV: 82.6 fL (ref 80.0–100.0)
Platelets: 205 10*3/uL (ref 150–440)
RBC: 3.84 MIL/uL — ABNORMAL LOW (ref 4.40–5.90)
RDW: 15.3 % — AB (ref 11.5–14.5)
WBC: 16.7 10*3/uL — AB (ref 3.8–10.6)

## 2017-03-15 LAB — HEPARIN LEVEL (UNFRACTIONATED): HEPARIN UNFRACTIONATED: 0.44 [IU]/mL (ref 0.30–0.70)

## 2017-03-15 LAB — MAGNESIUM: Magnesium: 2 mg/dL (ref 1.7–2.4)

## 2017-03-15 MED ORDER — FAMOTIDINE IN NACL 20-0.9 MG/50ML-% IV SOLN
20.0000 mg | INTRAVENOUS | Status: DC
  Administered 2017-03-16 – 2017-03-18 (×3): 20 mg via INTRAVENOUS
  Filled 2017-03-15 (×3): qty 50

## 2017-03-15 MED ORDER — MIDAZOLAM HCL 2 MG/2ML IJ SOLN: 1.0000 mg | INTRAMUSCULAR | Status: DC | PRN

## 2017-03-15 MED ORDER — PRO-STAT SUGAR FREE PO LIQD
60.0000 mL | Freq: Two times a day (BID) | ORAL | Status: DC
  Administered 2017-03-15 – 2017-03-19 (×9): 60 mL

## 2017-03-15 MED ORDER — MIDAZOLAM HCL 2 MG/2ML IJ SOLN: 2.0000 mg | INTRAMUSCULAR | Status: DC | PRN

## 2017-03-15 MED ORDER — VITAL HIGH PROTEIN PO LIQD
1000.0000 mL | ORAL | Status: DC
  Administered 2017-03-15 – 2017-03-19 (×7): 1000 mL

## 2017-03-15 MED ORDER — ADULT MULTIVITAMIN LIQUID CH
15.0000 mL | Freq: Every day | ORAL | Status: DC
  Administered 2017-03-15 – 2017-03-18 (×4): 15 mL
  Filled 2017-03-15 (×5): qty 15

## 2017-03-15 MED ORDER — DEXTROSE 5 % IV SOLN
2.0000 g | INTRAVENOUS | Status: DC
  Administered 2017-03-16: 2 g via INTRAVENOUS
  Filled 2017-03-15 (×2): qty 2

## 2017-03-15 NOTE — Progress Notes (Signed)
Nutrition Follow-up  DOCUMENTATION CODES:   Morbid obesity  INTERVENTION:  Patient's tube feeds were decreased to 20 mL/hr to help with glycemic control.  Recommend instead to advance back to goal rate and covering tube feeds with insulin as per protocol.  Recommend advancing to goal rate of Vital High Protein at 60 mL/hr (1440 mL goal daily volume) + Pro-Stat 60 mL BID. Provides 1840 kcal, 186 grams of protein, 1210 mL H2O daily.   At goal rate provides 161 grams carbohydrates daily, or approximately 6.7 grams carbohydrates each hour. Protocol recommends covering CHO using Glucostabilizer with 1 unit for every 10 grams of CHO. After discussing with pharmacy, recommendation would be to cover with 0.5 units insulin per hour if patient was at goal rate.  Provide liquid MVI daily per tube while patient is not at goal TF rate.  NUTRITION DIAGNOSIS:   Inadequate oral intake related to inability to eat as evidenced by NPO status.  Ongoing.  GOAL:   Provide needs based on ASPEN/SCCM guidelines  Progressing with tube feeds.  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 Patient remained unresponsive in ER requiring mechanical intubation for airway protection. Patient found to have severe DKA, septic shock, acute liver failure with hepatic coma, and anuric renal failure requiring CRRT.  -CRRT was stopped evening of 1/13. Plan is for HD today.  No documented BM this admission.  Access: OGT placed 1/11; tip in stomach per chest x-ray 1/13; 70 cm at corner of mouth  MAP: 76-114 mmHg; patient is off vasopressors  TF: patient was tolerating Vital High Protein at 45 mL/hr well  with plans to advance to goal rate by noon; however per discussion on rounds MD decreased tube feeds to 20 mL/hr until glucose is able to be better controlled  Patient is currently intubated on ventilator support MV: 13.1 L/min Temp (24hrs), Avg:98.2 F (36.8 C), Min:97.3 F (36.3 C), Max:98.8 F (37.1 C)  Propofol: N/A  Medications reviewed and include: ceftriaxone, famotidine, fentanyl gtt, heparin gtt, regular insulin gtt at 18 mL/hr, Keppra, vancomycin.  Labs reviewed: CBG 150-190, Chloride 98, BUN 36.  I/O: only 46 mL UOP yesterday; 433 mL taken off from CVVHD yesterday before it was stopped  Weight trend: 164.5 kg 1/14; +18.4 kg from 1/10  Discussed with RN and on rounds.  Diet Order:  No diet orders on file  EDUCATION NEEDS:   No education needs have been identified at this time  Skin:  Skin Assessment: Skin Integrity Issues: Skin Integrity Issues:: Stage III, Other (Comment) Stage III: right ankle Other: serous blister left arm; cracking to bilateral feet; ecchymosis to left head, arm, and leg  Last BM:  Unknown  Height:   Ht Readings from Last 1 Encounters:  03/11/17 5\' 9"  (1.753 m)    Weight:   Wt Readings from Last 1 Encounters:  03/15/17 (!) 362 lb 10.5 oz (164.5 kg)    Ideal Body Weight:  72.7 kg  BMI:  Body mass index is 53.56 kg/m.  Estimated Nutritional Needs:   Kcal:  4818-5909 (11-14 kcal/kg)  Protein:  >/= 182 grams (>/= 2.5 grams/kg IBW)  Fluid:  2.2-2.5 L/day (30-35 mL/kg  IBW)  Helane Rima, MS, RD, LDN Office: [phone] Pager: [phone] After Hours/Weekend Pager: [phone]

## 2017-03-15 NOTE — Progress Notes (Signed)
Pharmacy Antibiotic Note  Zachary Graves is a 37 y.o. male admitted on 03/08/2017 with sepsis.  Pharmacy has been consulted for vancomycin and cefepime dosing.  Regimen changed to vancomycin, ceftriaxone for r/o meningitis, unable to perform spinal tap due to patient's condition.  Patient is now off CRRT.   Plan: Will continue with current Vancomycin 1500mg  Iv q24h and check a vanc trough on 1/15.  Height: 5\' 9"  (175.3 cm) Weight: (!) 362 lb 10.5 oz (164.5 kg) IBW/kg (Calculated) : 70.7  Temp (24hrs), Avg:98.3 F (36.8 C), Min:97.3 F (36.3 C), Max:98.8 F (37.1 C)  Recent Labs  Lab 03/28/2017 2327 03/11/17 0113 03/11/17 0428  03/12/17 0321 03/12/17 0533  03/13/17 0359  03/14/17 0514 03/14/17 0634 03/14/17 1039 03/14/17 1457 03/14/17 1830 03/15/17 0436  WBC  --   --  14.4*  --   --  13.1*  --  18.0*  --  13.2*  --   --   --   --  16.7*  CREATININE  --  2.81* 2.69*   < > 2.59*  --    < > 2.56*   < >  --  2.66* 2.67* 2.81* 2.97* 3.50*  LATICACIDVEN 4.2* 4.8* 5.0*  --   --   --   --   --   --   --   --   --   --   --   --   VANCOTROUGH  --   --   --   --  20  --   --   --   --   --   --   --   --   --   --    < > = values in this interval not displayed.    Estimated Creatinine Clearance: 44.7 mL/min (A) (by C-G formula based on SCr of 3.5 mg/dL (H)).    Allergies  Allergen Reactions  . Naproxen   . Sulfa Antibiotics Hives    Antimicrobials this admission: Zosyn 1/9 x 1 Cefepime 1/10 x 1 Vancomycin 1/10 >> CTX 1/10 >> Acyclovir 1/10 >> 1/13  Dose adjustments this admission:   Microbiology results: 1/9 BCx: NGTD 1/9 UCx: NG 1/9 Sputum: pending  1/10 Wound cx: pending 1/10 MRSA PCR: (+)  Thank you for allowing pharmacy to be a part of this patient's care.  Clovia Cuff, PharmD, BCPS 03/15/2017 3:50 PM

## 2017-03-15 NOTE — Progress Notes (Signed)
[facility] [place] [facility]   PATIENT NAME: Zachary Graves    MR#:  [id number]  DATE OF BIRTH:  10/13/1980  SUBJECTIVE:   Patient is off sedation but still not following commands. Remains on the ventilator at 40% FiO2. Off vasopressors. Neurology is planning for MRI of the brain later today. Patient's mother is at bedside and questions answered.  REVIEW OF SYSTEMS:    Review of Systems  Unable to perform ROS: Intubated    Nutrition: Tube feeds Tolerating Diet: Yes Tolerating PT: Await Eval once Extubated.   DRUG ALLERGIES:   Allergies  Allergen Reactions  . Naproxen   . Sulfa Antibiotics Hives    VITALS:  Blood pressure 108/63, pulse (!) 117, temperature 98.6 F (37 C), resp. rate (!) 2, height 5\' 9"  (1.753 m), weight (!) 164.5 kg (362 lb 10.5 oz), SpO2 94 %.  PHYSICAL EXAMINATION:   Physical Exam  GENERAL:  37 y.o.-year-old obese patient lying in bed encephalopathic & intubated.  EYES: Pupils equal, round, reactive to light. No scleral icterus. HEENT: Head atraumatic, normocephalic. ET and OG tubes in place.  NECK:  Supple, no jugular venous distention. No thyroid enlargement, no tenderness.  LUNGS: Normal breath sounds bilaterally, no wheezing, rales, rhonchi. No use of accessory muscles of respiration.  CARDIOVASCULAR: S1, S2 normal. No murmurs, rubs, or gallops.  ABDOMEN: Soft, nontender, nondistended. Bowel sounds present. No organomegaly or mass.  EXTREMITIES: No cyanosis, clubbing or edema b/l.   Left upper ext. Edema > right. Anasarcic.  NEUROLOGIC: Encephalopathic and intubated.  PSYCHIATRIC: Encephalopathic and Intubated.  SKIN: No obvious rash, lesion, or ulcer. Sign of chronic venous stasis and some dry excoriating areas in the left foot.   LABORATORY PANEL:   CBC Recent Labs  Lab 03/15/17 0436  WBC 16.7*  HGB 10.5*  HCT 31.7*  PLT 205    ------------------------------------------------------------------------------------------------------------------  Chemistries  Recent Labs  Lab 03/15/17 0436  NA 137  K 3.9  CL 98*  CO2 30  GLUCOSE 188*  BUN 36*  CREATININE 3.50*  CALCIUM 7.0*  MG 2.0  AST 169*  ALT 508*  ALKPHOS 135*  BILITOT 0.8   ------------------------------------------------------------------------------------------------------------------  Cardiac Enzymes Recent Labs  Lab 03/11/17 1241  TROPONINI 1.35*   ------------------------------------------------------------------------------------------------------------------  RADIOLOGY:  Dg Chest Port 1 View  Result Date: 03/14/2017 CLINICAL DATA:  Ventilator EXAM: PORTABLE CHEST 1 VIEW COMPARISON:  03/12/2017 FINDINGS: Low lung volumes with bibasilar atelectasis. Endotracheal tube, right central line are unchanged. Interval placement of NG tube into the stomach. Heart is borderline in size. No effusions. IMPRESSION: Low lung volumes, bibasilar atelectasis. Electronically Signed   By: Charlett Nose M.D.   On: 03/14/2017 11:23     ASSESSMENT AND PLAN:   37 year old male with past medical history of morbid obesity, diabetes, medical noncompliance, diabetic neuropathy who presented to the hospital due to altered mental status and noted to be in acute rhabdomyolysis and also acute diabetic ketoacidosis.  1. Altered mental status/encephalopathy- pt. Remains encephalopathic and does not respond to verbal stimuli. Off sedation now.  -CT head although was positive for possible subacute CVA. Seen by neurology and plan for doing an MRI today -Continue Keppra -Follow mental status once patient is extubated.  2. Sepsis-no clear source identified. Continue broad-spectrum IV antibiotics with vancomycin, ceftriaxone.  -Cultures so far remain negative. Off vasopressors and stress dose steroids. BP improved.  - Follow hemodynamics.  Pt's right ankle wound cultures  are + for MRSA but BC remain (-).  3. Acute diabetic ketoacidosis-secondary to patient's noncompliance. -Improved with IV fluids, insulin drip. Anion gap currently closed. Continue on insulin drip per ICU protocol. Follow blood sugars.  4. Acute renal failure-secondary to sepsis. Patient remains oliguric, seen by nephrology, off CRRT now. No acute indication for hemodialysis and will continue to follow lites and volume status. -- Renal dose meds, avoid nephrotoxins. Follow urine output.  5. Left upper extremity DVT-continue heparin drip.  6. Acute rhabdomyolysis-continue IV fluid hydration, sodium bicarbonate. -CKs improving.  7. Abnormal LFTs-secondary to shock liver from septic shock and multiorgan failure. - LFT's improving.   Patient is critically ill with multiorgan failure. Prognosis remains guarded.   All the records are reviewed and case discussed with Care Management/Social Worker. Management plans discussed with the patient, family and they are in agreement.  CODE STATUS: Full  DVT Prophylaxis: Heparin gtt  TOTAL TIME TAKING CARE OF THIS PATIENT: 30 minutes.   POSSIBLE D/C unclear, DEPENDING ON CLINICAL CONDITION.   Houston Siren M.D on 03/15/2017 at 3:09 PM  Between 7am to 6pm - Pager - [phone]  After 6pm go to [email or web address] - Social research officer, government  [facility]  Office  [phone]  CC: Primary care physician; Patient, No Pcp Per

## 2017-03-15 NOTE — Progress Notes (Signed)
[facility], [place] 03/15/17  Subjective:   UOP 46 cc last 24 hrs Off CRRT now Off pressors Remains intubated, requiring ventilator support. Fio2 40% Mother is at bedside Tube feeds 45 cc/hr Sedation was just turned off.    Objective:  Vital signs in last 24 hours:  Temp:  [97.3 F (36.3 C)-98.8 F (37.1 C)] 98.6 F (37 C) (01/14 0813) Pulse Rate:  [114-132] 122 (01/14 0813) Resp:  [0-21] 20 (01/14 0813) BP: (108-160)/(60-136) 135/92 (01/14 0800) SpO2:  [90 %-98 %] 98 % (01/14 0824) FiO2 (%):  [30 %-40 %] 40 % (01/14 0824) Weight:  [164.5 kg (362 lb 10.5 oz)] 164.5 kg (362 lb 10.5 oz) (01/14 0500)  Weight change: -0.1 kg (-3.5 oz) Filed Weights   03/13/17 0500 03/14/17 0500 03/15/17 0500  Weight: (!) 160.6 kg (354 lb 0.9 oz) (!) 164.6 kg (362 lb 14 oz) (!) 164.5 kg (362 lb 10.5 oz)    Intake/Output:    Intake/Output Summary (Last 24 hours) at 03/15/2017 1002 Last data filed at 03/15/2017 0819 Gross per 24 hour  Intake 4865.62 ml  Output 334 ml  Net 4531.62 ml     Physical Exam: General: Lying in bed.   HEENT Normocephalic.   Neck Supple   Pulm/lungs Intubated. Coarse lung sounds   CVS/Heart Tachycardic   Abdomen:  Soft  Extremities: Left upper extremity significantly more swollen.  Generalized dependent edema.   Neurologic: Restless. Not follow commands   Skin: Bruising, blistering and weeping present on left forearm.  Bruising on left pinna. Bluish discoloration on left forearm. Right great toe discoloration.   Access: Right femoral        Basic Metabolic Panel:  Recent Labs  Lab 03/14/17 0634 03/14/17 1039 03/14/17 1457 03/14/17 1830 03/15/17 0436  NA 135 136 136 136 137  K 3.9 4.1 3.9 3.8 3.9  CL 99* 99* 98* 98* 98*  CO2 27 28 29 29 30   GLUCOSE 209* 200* 212* 184* 188*  BUN 26* 25* 28* 29* 36*  CREATININE 2.66* 2.67* 2.81* 2.97* 3.50*  CALCIUM 7.1* 7.1* 7.2* 6.9* 7.0*  MG 1.9 1.8 2.0 1.9 2.0  PHOS 2.9 3.0 2.9  3.1 3.2     CBC: Recent Labs  Lab 03/03/2017 2012 03/11/17 0428 03/12/17 0533 03/13/17 0359 03/14/17 0514 03/15/17 0436  WBC 13.4* 14.4* 13.1* 18.0* 13.2* 16.7*  NEUTROABS 11.5*  --  11.2*  --   --   --   HGB 15.5 14.7 13.0 13.9 11.3* 10.5*  HCT 54.0* 46.6 40.5 42.3 33.8* 31.7*  MCV 94.1 86.2 83.9 83.2 82.5 82.6  PLT 379 355 224 226 153 205     No results found for: HEPBSAG, HEPBSAB, HEPBIGM    Microbiology:  Recent Results (from the past 240 hour(s))  Urine culture     Status: None   Collection Time: 03/31/2017  8:12 PM  Result Value Ref Range Status   Specimen Description   Final    URINE, RANDOM Performed at [facility], [address], [place], [place] [address]    Special Requests   Final    NONE Performed at [facility], [address], [place], [place] [address]    Culture   Final    NO GROWTH Performed at [facility], [place] [address] [place], [place] [address]    Report Status 03/12/2017 FINAL  Final  Blood Culture (routine x 2)     Status: None   Collection Time: 03/09/2017  11:28 PM  Result Value Ref Range Status   Specimen Description BLOOD LT [facility]  Final   Special Requests   Final    BOTTLES DRAWN AEROBIC AND ANAEROBIC Blood Culture adequate volume   Culture   Final    NO GROWTH 5 DAYS Performed at [facility], [address] [place], [place] [address]    Report Status 03/15/2017 FINAL  Final  Blood Culture (routine x 2)     Status: None   Collection Time: 03/03/2017 11:28 PM  Result Value Ref Range Status   Specimen Description BLOOD RT [facility]  Final   Special Requests   Final    BOTTLES DRAWN AEROBIC AND ANAEROBIC Blood Culture adequate volume   Culture   Final    NO GROWTH 5 DAYS Performed at [facility], [address], [place], [place] [address]    Report Status 03/15/2017 FINAL  Final  MRSA PCR Screening     Status: Abnormal   Collection Time: 03/11/17 12:20 AM  Result Value Ref Range Status    MRSA by PCR POSITIVE (A) NEGATIVE Final    Comment:        The GeneXpert MRSA Assay (FDA approved for NASAL specimens only), is one component of a comprehensive MRSA colonization surveillance program. It is not intended to diagnose MRSA infection nor to guide or monitor treatment for MRSA infections. RESULT CALLED TO, READ BACK BY AND VERIFIED WITH: BARBARA THAO ON 03/11/17 AT 0310 JAG Performed at [facility], [address] [place], [place] [address]   Aerobic/Anaerobic Culture (surgical/deep wound)     Status: None (Preliminary result)   Collection Time: 03/11/17 12:53 AM  Result Value Ref Range Status   Specimen Description   Final    ANKLE RIGHT ANKLE Performed at [facility], [address] [place], [place] [address]    Special Requests   Final    NONE Performed at [facility], [address] [place], [place] [address]    Gram Stain   Final    RARE WBC PRESENT, PREDOMINANTLY PMN FEW GRAM POSITIVE RODS RARE GRAM POSITIVE COCCI IN PAIRS RARE YEAST    Culture   Final    ABUNDANT METHICILLIN RESISTANT STAPHYLOCOCCUS AUREUS MODERATE GROUP B STREP(S.AGALACTIAE)ISOLATED TESTING AGAINST S. AGALACTIAE NOT ROUTINELY PERFORMED DUE TO PREDICTABILITY OF AMP/PEN/VAN SUSCEPTIBILITY. FEW CANDIDA TROPICALIS HOLDING FOR POSSIBLE ANAEROBE Performed at [facility], [place] [address] [place], [place] [address]    Report Status PENDING  Incomplete   Organism ID, Bacteria METHICILLIN RESISTANT STAPHYLOCOCCUS AUREUS  Final      Susceptibility   Methicillin resistant staphylococcus aureus - MIC*    CIPROFLOXACIN >=8 RESISTANT Resistant     ERYTHROMYCIN <=0.25 SENSITIVE Sensitive     GENTAMICIN <=0.5 SENSITIVE Sensitive     OXACILLIN RESISTANT Resistant     TETRACYCLINE <=1 SENSITIVE Sensitive     VANCOMYCIN <=0.5 SENSITIVE Sensitive     TRIMETH/SULFA <=10 SENSITIVE Sensitive     CLINDAMYCIN <=0.25 SENSITIVE Sensitive     RIFAMPIN <=0.5  SENSITIVE Sensitive     Inducible Clindamycin NEGATIVE Sensitive     * ABUNDANT METHICILLIN RESISTANT STAPHYLOCOCCUS AUREUS    Coagulation Studies: Recent Labs    03/13/17 0359  LABPROT 15.3*  INR 1.22    Urinalysis: No results for input(s): COLORURINE, LABSPEC, PHURINE, GLUCOSEU, HGBUR, BILIRUBINUR, KETONESUR, PROTEINUR, UROBILINOGEN, NITRITE, LEUKOCYTESUR in the last 72 hours.  Invalid input(s): APPERANCEUR    Imaging: Dg Chest [facility] 1 [address]  Result Date: 03/14/2017 CLINICAL DATA:  Ventilator EXAM: PORTABLE CHEST 1 VIEW COMPARISON:  03/12/2017 FINDINGS: Low lung volumes with bibasilar atelectasis. Endotracheal tube, right central line are unchanged. Interval placement of NG tube into the stomach. Heart is borderline in size. No effusions. IMPRESSION: Low lung volumes, bibasilar atelectasis. Electronically Signed   By: Rolm Baptise M.D.   On: 03/14/2017 11:23     Medications:   . sodium chloride    . [START ON 03/16/2017] cefTRIAXone (ROCEPHIN)  IV    . [START ON 03/16/2017] famotidine (PEPCID) IV    . fentaNYL 0 mcg/hr (03/15/17 0935)  . heparin 2,400 Units/hr (03/15/17 0700)  . insulin (NOVOLIN-R) infusion 17.5 Units/hr (03/15/17 0933)  . levETIRAcetam Stopped (03/15/17 0223)  . vancomycin Stopped (03/15/17 0059)   . alteplase  2 mg Intracatheter Once  . alteplase  2 mg Intracatheter Once  . chlorhexidine gluconate (MEDLINE KIT)  15 mL Mouth Rinse BID  . Chlorhexidine Gluconate Cloth  6 each Topical Q0600  . feeding supplement (PRO-STAT SUGAR FREE 64)  30 mL Per Tube BID  . feeding supplement (VITAL HIGH PROTEIN)  1,000 mL Per Tube Q24H  . mouth rinse  15 mL Mouth Rinse 10 times per day  . mupirocin ointment  1 application Nasal BID   sodium chloride, acetaminophen, midazolam, midazolam, ondansetron (ZOFRAN) IV  Assessment/ Plan:  37 y.o. male with insulin dependent diabetes mellitus, hypertension, morbid obesity, diabetic peripheral neuropathy, Legg-calve-Perthes  Disease, osteoarthritis, who was admitted to [facility] on1/9/2019for diabetic ketoacidosis  1.  Acute renal failure. Urine output poor. Discontinued CRRT last night. No acute indication for HD at this time. Will continue to monitor. Electrolytes and volume status are acceptable.   2. DKA. Diabetes, insulin dependent- adult onset. Not currently using insulin at home. Poorly controlled- A1c >15.  Currently managed by ICU team.   3. Rhabdomyolysis. Found down at home for undetermined amount of time at least 6 hours.  Required HD. CK levels are slowly improving.   4. Generalized Edema. Will continue to monitor.   5. Acute respiratory failure. Requiring ventilator support.      LOS: [place] 1/14/201910:02 [place], [place]

## 2017-03-15 NOTE — Progress Notes (Signed)
Pt transferred to MRI with 2 RN, 3 RT, 1 transporter at bedside. Will continue to monitor.

## 2017-03-15 NOTE — Progress Notes (Signed)
Subjective: Only fentanyl at 100.    Objective: Current vital signs: BP (!) 162/92   Pulse (!) 130   Temp 98.8 F (37.1 C)   Resp (!) 24   Ht 5' 9"  (1.753 m)   Wt (!) 362 lb 10.5 oz (164.5 kg)   SpO2 94%   BMI 53.56 kg/m  Vital signs in last 24 hours: Temp:  [97.3 F (36.3 C)-98.8 F (37.1 C)] 98.8 F (37.1 C) (01/14 1200) Pulse Rate:  [114-130] 130 (01/14 1200) Resp:  [4-24] 24 (01/14 1200) BP: (108-162)/(60-96) 162/92 (01/14 1200) SpO2:  [94 %-98 %] 94 % (01/14 1200) FiO2 (%):  [40 %] 40 % (01/14 0824) Weight:  [362 lb 10.5 oz (164.5 kg)] 362 lb 10.5 oz (164.5 kg) (01/14 0500)  Intake/Output from previous day: 01/13 0701 - 01/14 0700 In: 5497.1 [I.V.:3753.1; NG/GT:494; IV Piggyback:1250] Out: 479 [Urine:46] Intake/Output this shift: Total I/O In: 5 [I.V.:5] Out: 5 [Emesis/NG output:5] Nutritional status: No diet orders on file  Neurologic Exam: Mental Status: Withdraws from painful stimuli  Cranial Nerves: II: patient does not respond confrontation bilaterally, pupils right 3 mm, left 3 mm,and reactive bilaterally III,IV,VI: doll's response absent bilaterally. V,VII: corneal reflex reduced bilaterally  VIII: patient does not respond to verbal stimuli IX,X: gag reflex reduced, XI: trapezius strength unable to test bilaterally XII: tongue strength unable to test Motor: Extremities flaccid throughout.  No spontaneous movement noted.  No purposeful movements noted. Sensory: Does not respond to noxious stimuli in any extremity.   Lab Results: Basic Metabolic Panel: Recent Labs  Lab 03/14/17 0634 03/14/17 1039 03/14/17 1457 03/14/17 1830 03/15/17 0436  NA 135 136 136 136 137  K 3.9 4.1 3.9 3.8 3.9  CL 99* 99* 98* 98* 98*  CO2 27 28 29 29 30   GLUCOSE 209* 200* 212* 184* 188*  BUN 26* 25* 28* 29* 36*  CREATININE 2.66* 2.67* 2.81* 2.97* 3.50*  CALCIUM 7.1* 7.1* 7.2* 6.9* 7.0*  MG 1.9 1.8 2.0 1.9 2.0  PHOS 2.9 3.0 2.9 3.1 3.2    Liver Function  Tests: Recent Labs  Lab 03/27/2017 2012 03/11/17 0428  03/12/17 1244  03/14/17 0634 03/14/17 1039 03/14/17 1457 03/14/17 1830 03/15/17 0436  AST 4,855* >10,000*  --  2,115*  --   --  282*  --   --  169*  ALT 2,250* 3,543*  --  2,181*  --   --  772*  --   --  508*  ALKPHOS 279*  --   --  110  --   --  132*  --   --  135*  BILITOT 1.2  --   --  0.6  --   --  0.8  --   --  0.8  PROT 8.3*  --   --  4.6*  --   --  5.1*  --   --  5.0*  ALBUMIN 3.6  --    < > 1.8*  1.8*   < > 1.6* 1.7*  1.8* 1.7* 1.7* 1.7*   < > = values in this interval not displayed.   Recent Labs  Lab 03/11/17 1543  LIPASE 1,539*  AMYLASE 789*   No results for input(s): AMMONIA in the last 168 hours.  CBC: Recent Labs  Lab 03/03/2017 2012 03/11/17 0428 03/12/17 0533 03/13/17 0359 03/14/17 0514 03/15/17 0436  WBC 13.4* 14.4* 13.1* 18.0* 13.2* 16.7*  NEUTROABS 11.5*  --  11.2*  --   --   --   HGB 15.5  14.7 13.0 13.9 11.3* 10.5*  HCT 54.0* 46.6 40.5 42.3 33.8* 31.7*  MCV 94.1 86.2 83.9 83.2 82.5 82.6  PLT 379 355 224 226 153 205    Cardiac Enzymes: Recent Labs  Lab 03/11/17 0113 03/11/17 0840 03/11/17 1241  03/12/17 1524 03/13/17 0359 03/13/17 1503 03/14/17 0304 03/14/17 1039  CKTOTAL  --   --   --    < > 35,820* 19,835* 11,678* 7,822* 3,652*  TROPONINI 0.67* 1.46* 1.35*  --   --   --   --   --   --    < > = values in this interval not displayed.    Lipid Panel: No results for input(s): CHOL, TRIG, HDL, CHOLHDL, VLDL, LDLCALC in the last 168 hours.  CBG: Recent Labs  Lab 03/15/17 0608 03/15/17 0703 03/15/17 0810 03/15/17 0905 03/15/17 1000  GLUCAP 170* 165* 162* 154* 169*    Microbiology: Results for orders placed or performed during the hospital encounter of 03/06/2017  Urine culture     Status: None   Collection Time: 03/17/2017  8:12 PM  Result Value Ref Range Status   Specimen Description   Final    URINE, RANDOM Performed at [facility], [address],  [place], [place] [address]    Special Requests   Final    NONE Performed at [facility], [address], [place], [place] [address]    Culture   Final    NO GROWTH Performed at [facility], [place] [address], [place], [place] [address]    Report Status 03/12/2017 FINAL  Final  Blood Culture (routine x 2)     Status: None   Collection Time: 03/15/2017 11:28 PM  Result Value Ref Range Status   Specimen Description BLOOD LT [facility]  Final   Special Requests   Final    BOTTLES DRAWN AEROBIC AND ANAEROBIC Blood Culture adequate volume   Culture   Final    NO GROWTH 5 DAYS Performed at [facility] [place] [facility], [address] [place], [place] [address]    Report Status 03/15/2017 FINAL  Final  Blood Culture (routine x 2)     Status: None   Collection Time: 03/14/2017 11:28 PM  Result Value Ref Range Status   Specimen Description BLOOD RT [facility]  Final   Special Requests   Final    BOTTLES DRAWN AEROBIC AND ANAEROBIC Blood Culture adequate volume   Culture   Final    NO GROWTH 5 DAYS Performed at [facility], [address], [place], [place] [address]    Report Status 03/15/2017 FINAL  Final  MRSA PCR Screening     Status: Abnormal   Collection Time: 03/11/17 12:20 AM  Result Value Ref Range Status   MRSA by PCR POSITIVE (A) NEGATIVE Final    Comment:        The GeneXpert MRSA Assay (FDA approved for NASAL specimens only), is one component of a comprehensive MRSA colonization surveillance program. It is not intended to diagnose MRSA infection nor to guide or monitor treatment for MRSA infections. RESULT CALLED TO, READ BACK BY AND VERIFIED WITH: BARBARA THAO ON 03/11/17 AT 0310 JAG Performed at [facility] [address] [place],  [address]   Aerobic/Anaerobic Culture (surgical/deep wound)     Status: None (Preliminary result)   Collection Time: 03/11/17 12:53 AM  Result Value Ref Range Status   Specimen Description   Final    ANKLE RIGHT  ANKLE Performed at [facility], [address] [place], [place] [address]    Special Requests   Final    NONE Performed at [facility], [place], [place] [address]    Gram Stain   Final    RARE WBC PRESENT, PREDOMINANTLY PMN FEW GRAM POSITIVE RODS RARE GRAM POSITIVE COCCI IN PAIRS RARE YEAST Performed at [facility], [place] [address], [place], [place] [address]    Culture   Final    ABUNDANT METHICILLIN RESISTANT STAPHYLOCOCCUS AUREUS MODERATE GROUP B STREP(S.AGALACTIAE)ISOLATED TESTING AGAINST S. AGALACTIAE NOT ROUTINELY PERFORMED DUE TO PREDICTABILITY OF AMP/PEN/VAN SUSCEPTIBILITY. FEW CANDIDA TROPICALIS NO ANAEROBES ISOLATED; CULTURE IN PROGRESS FOR 5 DAYS    Report Status PENDING  Incomplete   Organism ID, Bacteria METHICILLIN RESISTANT STAPHYLOCOCCUS AUREUS  Final      Susceptibility   Methicillin resistant staphylococcus aureus - MIC*    CIPROFLOXACIN >=8 RESISTANT Resistant     ERYTHROMYCIN <=0.25 SENSITIVE Sensitive     GENTAMICIN <=0.5 SENSITIVE Sensitive     OXACILLIN RESISTANT Resistant     TETRACYCLINE <=1 SENSITIVE Sensitive     VANCOMYCIN <=0.5 SENSITIVE Sensitive     TRIMETH/SULFA <=10 SENSITIVE Sensitive     CLINDAMYCIN <=0.25 SENSITIVE Sensitive     RIFAMPIN <=0.5 SENSITIVE Sensitive     Inducible Clindamycin NEGATIVE Sensitive     * ABUNDANT METHICILLIN RESISTANT STAPHYLOCOCCUS AUREUS    Coagulation Studies: Recent Labs    03/13/17 0359  LABPROT 15.3*  INR 1.22    Imaging: Dg Chest Port 1 View  Result Date: 03/14/2017 CLINICAL DATA:  Ventilator EXAM: PORTABLE CHEST 1 VIEW COMPARISON:  03/12/2017 FINDINGS: Low lung volumes with bibasilar atelectasis. Endotracheal tube, right central line are unchanged. Interval placement of NG tube into the stomach. Heart is borderline in size. No effusions. IMPRESSION: Low lung volumes, bibasilar atelectasis. Electronically Signed   By: Rolm Baptise M.D.   On: 03/14/2017  11:23    Medications:  I have reviewed the patient's current medications. Scheduled: . alteplase  2 mg Intracatheter Once  . alteplase  2 mg Intracatheter Once  . chlorhexidine gluconate (MEDLINE KIT)  15 mL Mouth Rinse BID  . Chlorhexidine Gluconate Cloth  6 each Topical Q0600  . feeding supplement (PRO-STAT SUGAR FREE 64)  30 mL Per Tube BID  . feeding supplement (VITAL HIGH PROTEIN)  1,000 mL Per Tube Q24H  . mouth rinse  15 mL Mouth Rinse 10 times per day  . mupirocin ointment  1 application Nasal BID    Assessment/Plan: Off pressors and only on fentanyl of 100 CCRT held  Keppra 500 BID on board Sepsis on IV antibiotics Discussed with mother and stated guarded to possibly poor prognosis EEG today Awaiting MRI that should be done today in setting of the L internal capsule stroke seen on CTA Will follow.      LOS: 5 days    03/15/2017  12:29 PM

## 2017-03-15 NOTE — Progress Notes (Signed)
[facility] Critical Care Medicine Progess Note   Name: Adis Sturgill MRN: [id number] DOB: 1980-03-24    ADMISSION DATE:  03/28/2017    SYNOPSIS   37 yo male with a PMH of Osteoarthritis, Chronic Right Foot Ulceration (currently managed by wound clinic at [facility]), Obesity, Neuropathic Pain, Legg-Calve-Perthes Diseases, Type I DM, and Chronic Back Pain.  He presented to [facility] ER via EMS on 01/9 after being found unresponsive on the floor at home by his family.  SUBJ: Off continuous sedation.  Minimally responsive.  Not following commands.   OBJ: VENTILATOR SETTINGS: Vent Mode: PSV FiO2 (%):  [40 %] 40 % Set Rate:  [18 bmp] 18 bmp Vt Set:  [650 mL] 650 mL PEEP:  [5 cmH20] 5 cmH20 Pressure Support:  [10 cmH20] 10 cmH20 Plateau Pressure:  [16 cmH20-17 cmH20] 17 cmH20  HEMODYNAMICS:    INTAKE / OUTPUT:  Intake/Output Summary (Last 24 hours) at 03/15/2017 1658 Last data filed at 03/15/2017 1600 Gross per 24 hour  Intake 3693.14 ml  Output 66 ml  Net 3627.14 ml      VITAL SIGNS: Temp:  [97.3 F (36.3 C)-98.8 F (37.1 C)] 98.6 F (37 C) (01/14 1500) Pulse Rate:  [114-130] 117 (01/14 1500) Resp:  [2-31] 2 (01/14 1500) BP: (108-162)/(60-93) 108/63 (01/14 1500) SpO2:  [89 %-98 %] 95 % (01/14 1524) FiO2 (%):  [40 %] 40 % (01/14 1524) Weight:  [164.5 kg (362 lb 10.5 oz)] 164.5 kg (362 lb 10.5 oz) (01/14 0500)  PHYSICAL EXAMINATION: Physical Examination:   VS: BP 108/63   Pulse (!) 117   Temp 98.6 F (37 C)   Resp (!) 2   Ht 5\' 9"  (1.753 m)   Wt (!) 164.5 kg (362 lb 10.5 oz)   SpO2 95%   BMI 53.56 kg/m   General Appearance: Patient is unresponsive, on fentanyl, orally intubated, right internal jugular dialysis catheter, left femoral central line Neuro: Nurse relates that patient has had evidence of responding, breathes above the ventilator, has a cough and a gag with pupillary response HEENT: Oral endotracheal tube noted, right internal jugular dialysis  catheter, trachea is midline Pulmonary: Coarse rhonchi appreciated right greater than left Cardiovascular regular rate and rhythm Abdomen: Soft exam, positive bowel sounds Extremities: Left hand still reveals absent distal radial pulse, left arm is swollen and hand is swollen with some mottling changes.    LABORATORY PANEL:   CBC Recent Labs  Lab 03/15/17 0436  WBC 16.7*  HGB 10.5*  HCT 31.7*  PLT 205    Chemistries  Recent Labs  Lab 03/15/17 0436  NA 137  K 3.9  CL 98*  CO2 30  GLUCOSE 188*  BUN 36*  CREATININE 3.50*  CALCIUM 7.0*  MG 2.0  PHOS 3.2  AST 169*  ALT 508*  ALKPHOS 135*  BILITOT 0.8    Recent Labs  Lab 03/15/17 1118 03/15/17 1257 03/15/17 1313 03/15/17 1415 03/15/17 1522 03/15/17 1607  GLUCAP 157* 150* 153* 147* 137* 139*   Recent Labs  Lab 03/12/17 0553 03/12/17 2308 03/14/17 1050  PHART 7.34* 7.41 7.45  PCO2ART 43 40 44  PO2ART 76* 64* 52*   Recent Labs  Lab 03/12/17 1244  03/14/17 1039 03/14/17 1457 03/14/17 1830 03/15/17 0436  AST 2,115*  --  282*  --   --  169*  ALT 2,181*  --  772*  --   --  508*  ALKPHOS 110  --  132*  --   --  135*  BILITOT 0.6  --  0.8  --   --  0.8  ALBUMIN 1.8*  1.8*   < > 1.7*  1.8* 1.7* 1.7* 1.7*   < > = values in this interval not displayed.    Cardiac Enzymes Recent Labs  Lab 03/11/17 1241  TROPONINI 1.35*    RADIOLOGY:  Dg Chest Port 1 View  Result Date: 03/14/2017 CLINICAL DATA:  Ventilator EXAM: PORTABLE CHEST 1 VIEW COMPARISON:  03/12/2017 FINDINGS: Low lung volumes with bibasilar atelectasis. Endotracheal tube, right central line are unchanged. Interval placement of NG tube into the stomach. Heart is borderline in size. No effusions. IMPRESSION: Low lung volumes, bibasilar atelectasis. Electronically Signed   By: Charlett Nose M.D.   On: 03/14/2017 11:23    ASSESSMENT/PLAN   Respiratory failure. Multifactorial etiology, multisystem organ failure secondary to overwhelming  sepsis. On pressure regulated volume control. Will obtain chest x-ray and arterial blood gas today. On withdrawal of sedation patient squeeze my hand  Seizure activity. No additional episodes of seizure noted, has been loaded with Keppra. EEG was performed, felt to be toxic metabolic-induced  Septic shock. She has been weaned off of pressors. We'll continue broad-spectrum anabiotic coverage for now  Renal failure. Multiple etiologies include acute tubular necrosis, sepsis, pigment-induced nephropathy secondary to rhabdomyolysis, Will recheck CK and LFTs  Vascular occlusion. Palpable pulses noted on the left radial artery, presently on anticoagulation   Venous thromboembolic disease. Left upper extremity DVT, on systemic heparin.   Transaminitis. Markedly elevated liver function tests, most likely represents shock liver with transaminitis, pending LFTs   Mother updated @ bedside   CCM time: 40 mins The above time includes time spent in consultation with patient and/or family members and reviewing care plan on multidisciplinary rounds  Billy Fischer, MD PCCM service Mobile [phone] Pager [phone]    03/15/2017

## 2017-03-15 NOTE — Progress Notes (Signed)
Patient has been off CRRT this shift. Nephrologist called this AM to see how he did off dialysis. Scheduled for hemodialysis today. Informed mother that was the plan today last night when she called to see how he was doing. Also scheduled for a MRI today, continued on insulin gtt with CGB q 1 hour. No temp this shift, and only showed discomfort once and Fentanyl was increased at that time. Has been ST on the monitor. Low urine output, and BP good since pressors were discontinued. Continue to monitor.

## 2017-03-15 NOTE — Progress Notes (Signed)
ANTICOAGULATION CONSULT NOTE  Pharmacy Consult for Heparin Indication: upper extremity DVT  Allergies  Allergen Reactions  . Naproxen   . Sulfa Antibiotics Hives    Patient Measurements: Height: 5\' 9"  (175.3 cm) Weight: (!) 362 lb 10.5 oz (164.5 kg) IBW/kg (Calculated) : 70.7 Heparin Dosing Weight: 105 kg  Vital Signs: Temp: 98.8 F (37.1 C) (01/14 0500) BP: 111/63 (01/14 0500) Pulse Rate: 121 (01/14 0500)  Labs: Recent Labs    03/13/17 0359  03/13/17 1503  03/14/17 0304 03/14/17 0514  03/14/17 1039 03/14/17 1457 03/14/17 1830 03/15/17 0436  HGB 13.9  --   --   --   --  11.3*  --   --   --   --  10.5*  HCT 42.3  --   --   --   --  33.8*  --   --   --   --  31.7*  PLT 226  --   --   --   --  153  --   --   --   --  205  APTT 43*  --   --   --   --  73*  --   --   --   --  64*  LABPROT 15.3*  --   --   --   --   --   --   --   --   --   --   INR 1.22  --   --   --   --   --   --   --   --   --   --   HEPARINUNFRC 0.43  --   --   --   --  0.51  --   --   --   --  0.44  CREATININE 2.56*   < > 2.59*   < > 2.58*  --    < > 2.67* 2.81* 2.97* 3.50*  CKTOTAL 85,885*  --  02,774*  --  7,822*  --   --  3,652*  --   --   --    < > = values in this interval not displayed.    Estimated Creatinine Clearance: 44.7 mL/min (A) (by C-G formula based on SCr of 3.5 mg/dL (H)).   Medical History: Past Medical History:  Diagnosis Date  . Back pain   . Diabetes 1.5, managed as type 1 (HCC)   . Legg-Calve-Perthes disease   . Neuropathic pain   . Obesity   . Osteoarthritis     Assessment: 37 y/o M admitted with unresponsiveness and ventilated with DKA and found to have upper extremity DVT.   HL = 0.52, therapeutic.   Goal of Therapy:  Heparin level 0.3-0.7 units/ml Monitor platelets by anticoagulation protocol: Yes   Plan:  Heparin level was drawn late but is therapeutic. Will order another heparin level in 6 hours to confirm that heparin does not need to be adjusted.    Yolanda Bonine, PharmD Pharmacy Resident 03/15/2017 5:56 AM    01/12 AM heparin level 0.43. Continue current regimen. Recheck heparin level and CBC with tomorrow AM labs.  01/13 AM heparin level 0.51. Continue current regimen. Recheck heparin level and CBC with tomorrow AM labs.  01/14 AM heparin level 0.44. Continue current regimen. Recheck heparin level and CBC with tomorrow AM labs.  Fulton Reek, PharmD, BCPS  03/15/17 5:56 AM   \

## 2017-03-15 NOTE — Progress Notes (Signed)
Inpatient Diabetes Program Recommendations  AACE/ADA: New Consensus Statement on Inpatient Glycemic Control (2015)  Target Ranges:  Prepandial:   less than 140 mg/dL      Peak postprandial:   less than 180 mg/dL (1-2 hours)      Critically ill patients:  140 - 180 mg/dL   Lab Results  Component Value Date   GLUCAP 154 (H) 03/15/2017   HGBA1C 14.7 (H) 03/12/2017    Review of Glycemic Control Results for Zachary Graves, Zachary Graves (MRN [id number]) as of 03/15/2017 09:47  Ref. Range 03/15/2017 06:08 03/15/2017 07:03 03/15/2017 08:10 03/15/2017 09:05  Glucose-Capillary Latest Ref Range: 65 - 99 mg/dL 916 (H) 384 (H) 665 (H) 154 (H)   Diabetes history: Primary Type 2DM, managed as a Type 1 DM since 02/05/15. Outpatient Diabetes medications: None listed, due to patient status upon admission. Note from 02/05/15 includes Glucotrol, Glucophage, and NPH 70/30 45 Units BID. Unclear as to whether this was current regimen. Current orders for Inpatient glycemic control: Insulin drip  Inpatient Diabetes Program Recommendations:    Appears patient requires IV insulin at this time, amounts exceeding 15 Units/hour. Would recommend consulting with endocrinology (Dr Dalbert Mayotte) to further discuss recommendations for this patient. Will need to have Md to MD conversation to see what they advise.   Thanks, Lujean Rave, MSN, RNC-OB Diabetes Coordinator [phone] (8a-5p)

## 2017-03-16 ENCOUNTER — Inpatient Hospital Stay: Payer: Medicaid Other

## 2017-03-16 ENCOUNTER — Inpatient Hospital Stay (HOSPITAL_COMMUNITY): Payer: Medicaid Other

## 2017-03-16 DIAGNOSIS — J9600 Acute respiratory failure, unspecified whether with hypoxia or hypercapnia: Secondary | ICD-10-CM

## 2017-03-16 DIAGNOSIS — G9341 Metabolic encephalopathy: Secondary | ICD-10-CM

## 2017-03-16 DIAGNOSIS — N179 Acute kidney failure, unspecified: Secondary | ICD-10-CM

## 2017-03-16 DIAGNOSIS — G931 Anoxic brain damage, not elsewhere classified: Secondary | ICD-10-CM

## 2017-03-16 LAB — GLUCOSE, CAPILLARY
GLUCOSE-CAPILLARY: 136 mg/dL — AB (ref 65–99)
GLUCOSE-CAPILLARY: 144 mg/dL — AB (ref 65–99)
GLUCOSE-CAPILLARY: 146 mg/dL — AB (ref 65–99)
GLUCOSE-CAPILLARY: 150 mg/dL — AB (ref 65–99)
GLUCOSE-CAPILLARY: 152 mg/dL — AB (ref 65–99)
GLUCOSE-CAPILLARY: 155 mg/dL — AB (ref 65–99)
GLUCOSE-CAPILLARY: 160 mg/dL — AB (ref 65–99)
GLUCOSE-CAPILLARY: 163 mg/dL — AB (ref 65–99)
GLUCOSE-CAPILLARY: 167 mg/dL — AB (ref 65–99)
GLUCOSE-CAPILLARY: 168 mg/dL — AB (ref 65–99)
GLUCOSE-CAPILLARY: 169 mg/dL — AB (ref 65–99)
GLUCOSE-CAPILLARY: 172 mg/dL — AB (ref 65–99)
GLUCOSE-CAPILLARY: 180 mg/dL — AB (ref 65–99)
GLUCOSE-CAPILLARY: 192 mg/dL — AB (ref 65–99)
Glucose-Capillary: 137 mg/dL — ABNORMAL HIGH (ref 65–99)
Glucose-Capillary: 136 mg/dL — ABNORMAL HIGH (ref 65–99)
Glucose-Capillary: 143 mg/dL — ABNORMAL HIGH (ref 65–99)
Glucose-Capillary: 156 mg/dL — ABNORMAL HIGH (ref 65–99)
Glucose-Capillary: 172 mg/dL — ABNORMAL HIGH (ref 65–99)
Glucose-Capillary: 190 mg/dL — ABNORMAL HIGH (ref 65–99)
Glucose-Capillary: 198 mg/dL — ABNORMAL HIGH (ref 65–99)
Glucose-Capillary: 192 mg/dL — ABNORMAL HIGH (ref 65–99)
Glucose-Capillary: 174 mg/dL — ABNORMAL HIGH (ref 65–99)
Glucose-Capillary: 140 mg/dL — ABNORMAL HIGH (ref 65–99)
Glucose-Capillary: 158 mg/dL — ABNORMAL HIGH (ref 65–99)

## 2017-03-16 LAB — HEPARIN LEVEL (UNFRACTIONATED)
HEPARIN UNFRACTIONATED: 0.68 [IU]/mL (ref 0.30–0.70)
Heparin Unfractionated: 0.6 IU/mL (ref 0.30–0.70)

## 2017-03-16 LAB — COMPREHENSIVE METABOLIC PANEL
ALBUMIN: 1.7 g/dL — AB (ref 3.5–5.0)
ALK PHOS: 164 U/L — AB (ref 38–126)
ALT: 300 U/L — ABNORMAL HIGH (ref 17–63)
ANION GAP: 9 (ref 5–15)
AST: 126 U/L — ABNORMAL HIGH (ref 15–41)
BILIRUBIN TOTAL: 0.6 mg/dL (ref 0.3–1.2)
BUN: 58 mg/dL — ABNORMAL HIGH (ref 6–20)
CALCIUM: 7 mg/dL — AB (ref 8.9–10.3)
CO2: 30 mmol/L (ref 22–32)
Chloride: 97 mmol/L — ABNORMAL LOW (ref 101–111)
Creatinine, Ser: 4.73 mg/dL — ABNORMAL HIGH (ref 0.61–1.24)
GFR calc non Af Amer: 15 mL/min — ABNORMAL LOW (ref >60)
GFR, EST AFRICAN AMERICAN: 17 mL/min — AB (ref >60)
Glucose, Bld: 164 mg/dL — ABNORMAL HIGH (ref 65–99)
POTASSIUM: 3.8 mmol/L (ref 3.5–5.1)
Sodium: 136 mmol/L (ref 135–145)
TOTAL PROTEIN: 5 g/dL — AB (ref 6.5–8.1)

## 2017-03-16 LAB — CBC
HCT: 30.5 % — ABNORMAL LOW (ref 40.0–52.0)
Hemoglobin: 10 g/dL — ABNORMAL LOW (ref 13.0–18.0)
MCH: 27.4 pg (ref 26.0–34.0)
MCHC: 32.9 g/dL (ref 32.0–36.0)
MCV: 83.3 fL (ref 80.0–100.0)
PLATELETS: 232 10*3/uL (ref 150–440)
RBC: 3.66 MIL/uL — AB (ref 4.40–5.90)
RDW: 15.4 % — ABNORMAL HIGH (ref 11.5–14.5)
WBC: 13.9 10*3/uL — AB (ref 3.8–10.6)

## 2017-03-16 LAB — APTT: APTT: 93 s — AB (ref 24–36)

## 2017-03-16 LAB — BRAIN NATRIURETIC PEPTIDE: B Natriuretic Peptide: 44 pg/mL (ref 0.0–100.0)

## 2017-03-16 LAB — VANCOMYCIN, TROUGH: VANCOMYCIN TR: 52 ug/mL — AB (ref 15–20)

## 2017-03-16 MED ORDER — FENTANYL CITRATE (PF) 100 MCG/2ML IJ SOLN
25.0000 ug | INTRAMUSCULAR | Status: DC | PRN
  Administered 2017-03-17: 25 ug via INTRAVENOUS
  Filled 2017-03-16: qty 2

## 2017-03-16 MED ORDER — FUROSEMIDE 10 MG/ML IJ SOLN
80.0000 mg | Freq: Once | INTRAMUSCULAR | Status: AC
  Administered 2017-03-16: 80 mg via INTRAVENOUS
  Filled 2017-03-16: qty 8

## 2017-03-16 NOTE — Progress Notes (Signed)
Patient escorted to MRI by 2RN 3RT via bed. RN stayed with Patient Pt brought back to ICU without any distress. Continue to observe closely.

## 2017-03-16 NOTE — Progress Notes (Signed)
   03/16/17 1900  Clinical Encounter Type  Visited With Patient not available;Family  Visit Type Spiritual support;Psychological support;Follow-up  Referral From Nurse  Spiritual Encounters  Spiritual Needs Prayer;Grief support  CH paged to visit with father of patient; CH offered prayer, emotional and spiritual support. Erline Levine

## 2017-03-16 NOTE — Progress Notes (Signed)
ANTICOAGULATION CONSULT NOTE  Pharmacy Consult for Heparin Indication: upper extremity DVT  Allergies  Allergen Reactions  . Naproxen   . Sulfa Antibiotics Hives    Patient Measurements: Height: 5\' 9"  (175.3 cm) Weight: (!) 362 lb 14 oz (164.6 kg) IBW/kg (Calculated) : 70.7 Heparin Dosing Weight: 105 kg  Vital Signs: Temp: 99.4 F (37.4 C) (01/15 0400) Temp Source: Oral (01/15 0400) BP: 151/94 (01/15 0600) Pulse Rate: 114 (01/15 0600)  Labs: Recent Labs    03/13/17 1503  03/14/17 0304  03/14/17 0514  03/14/17 1039  03/14/17 1830 03/15/17 0436 03/16/17 0520  HGB  --   --   --    < > 11.3*  --   --   --   --  10.5* 10.0*  HCT  --   --   --   --  33.8*  --   --   --   --  31.7* 30.5*  PLT  --   --   --   --  153  --   --   --   --  205 232  APTT  --   --   --   --  73*  --   --   --   --  64* 93*  HEPARINUNFRC  --   --   --   --  0.51  --   --   --   --  0.44 0.68  CREATININE 2.59*   < > 2.58*  --   --    < > 2.67*   < > 2.97* 3.50* 4.73*  CKTOTAL 11,678*  --  7,822*  --   --   --  3,652*  --   --   --   --    < > = values in this interval not displayed.    Estimated Creatinine Clearance: 33.1 mL/min (A) (by C-G formula based on SCr of 4.73 mg/dL (H)).   Medical History: Past Medical History:  Diagnosis Date  . Back pain   . Diabetes 1.5, managed as type 1 (HCC)   . Legg-Calve-Perthes disease   . Neuropathic pain   . Obesity   . Osteoarthritis     Assessment: 37 y/o M admitted with unresponsiveness and ventilated with DKA and found to have upper extremity DVT.   HL = 0.52, therapeutic.   Goal of Therapy:  Heparin level 0.3-0.7 units/ml Monitor platelets by anticoagulation protocol: Yes   Plan:  Heparin level was drawn late but is therapeutic. Will order another heparin level in 6 hours to confirm that heparin does not need to be adjusted.   01/12 AM heparin level 0.43. Continue current regimen. Recheck heparin level and CBC with tomorrow AM  labs.  01/13 AM heparin level 0.51. Continue current regimen. Recheck heparin level and CBC with tomorrow AM labs.  01/14 AM heparin level 0.44. Continue current regimen. Recheck heparin level and CBC with tomorrow AM labs.  01/15 @ 0520 HL 0.68 therapeutic, but jumped up pretty quick close to upper limit of normal. Will recheck HL @ 1100 to make sure level is not going to be supratherapeutic.   Thomasene Ripple, PharmD, BCPS Clinical Pharmacist 03/16/2017

## 2017-03-16 NOTE — Progress Notes (Signed)
ANTICOAGULATION CONSULT NOTE  Pharmacy Consult for Heparin Indication: upper extremity DVT  Allergies  Allergen Reactions  . Naproxen   . Sulfa Antibiotics Hives    Patient Measurements: Height: 5\' 9"  (175.3 cm) Weight: (!) 362 lb 14 oz (164.6 kg) IBW/kg (Calculated) : 70.7 Heparin Dosing Weight: 105 kg  Vital Signs: Temp: 98.5 F (36.9 C) (01/15 0753) Temp Source: Oral (01/15 0753) BP: 130/70 (01/15 1100) Pulse Rate: 117 (01/15 1100)  Labs: Recent Labs    03/13/17 1503  03/14/17 0304  03/14/17 0514  03/14/17 1039  03/14/17 1830 03/15/17 0436 03/16/17 0520 03/16/17 1137  HGB  --   --   --    < > 11.3*  --   --   --   --  10.5* 10.0*  --   HCT  --   --   --   --  33.8*  --   --   --   --  31.7* 30.5*  --   PLT  --   --   --   --  153  --   --   --   --  205 232  --   APTT  --   --   --   --  73*  --   --   --   --  64* 93*  --   HEPARINUNFRC  --   --   --    < > 0.51  --   --   --   --  0.44 0.68 0.60  CREATININE 2.59*   < > 2.58*  --   --    < > 2.67*   < > 2.97* 3.50* 4.73*  --   CKTOTAL 78,295*  --  7,822*  --   --   --  3,652*  --   --   --   --   --    < > = values in this interval not displayed.    Estimated Creatinine Clearance: 33.1 mL/min (A) (by C-G formula based on SCr of 4.73 mg/dL (H)).   Medical History: Past Medical History:  Diagnosis Date  . Back pain   . Diabetes 1.5, managed as type 1 (HCC)   . Legg-Calve-Perthes disease   . Neuropathic pain   . Obesity   . Osteoarthritis     Assessment: 37 y/o M admitted with unresponsiveness and ventilated with DKA and found to have upper extremity DVT.   Heparin infusing at 2400 units/hr HL = 0.60, therapeutic.   Goal of Therapy:  Heparin level 0.3-0.7 units/ml Monitor platelets by anticoagulation protocol: Yes   Plan:  Will continue with current rate. Heparin level and CBC with AM labs.  Clovia Cuff, PharmD, BCPS 03/16/2017 12:11 PM

## 2017-03-16 NOTE — Progress Notes (Signed)
[facility] [place] [facility]   PATIENT NAME: Antiono Noland    MR#:  [id number]  DATE OF BIRTH:  1980-10-16  SUBJECTIVE:   Patient is off sedation but still not following commands. MRI of the brain showing evidence of anoxic brain injury. Neurology discussed with the mother does not want to pursue a trach/PEG. Continue supportive care for now and if not improving by the end of the week consider withdrawal of care.  REVIEW OF SYSTEMS:    Review of Systems  Unable to perform ROS: Intubated    Nutrition: Tube feeds Tolerating Diet: Yes Tolerating PT: Await Eval once Extubated.   DRUG ALLERGIES:   Allergies  Allergen Reactions  . Naproxen   . Sulfa Antibiotics Hives    VITALS:  Blood pressure (!) 116/59, pulse (!) 116, temperature 98.5 F (36.9 C), temperature source Oral, resp. rate (!) 26, height 5\' 9"  (1.753 m), weight (!) 164.6 kg (362 lb 14 oz), SpO2 97 %.  PHYSICAL EXAMINATION:   Physical Exam  GENERAL:  37 y.o.-year-old obese patient lying in bed encephalopathic & intubated.  EYES: Pupils equal, round, reactive to light. No scleral icterus. HEENT: Head atraumatic, normocephalic. ET and OG tubes in place.  NECK:  Supple, no jugular venous distention. No thyroid enlargement, no tenderness.  LUNGS: Normal breath sounds bilaterally, no wheezing, rales, rhonchi. No use of accessory muscles of respiration.  CARDIOVASCULAR: S1, S2 normal. No murmurs, rubs, or gallops.  ABDOMEN: Soft, nontender, nondistended. Bowel sounds present. No organomegaly or mass.  EXTREMITIES: No cyanosis, clubbing or edema b/l.   Left upper ext. Edema > right. Anasarcic.  NEUROLOGIC: Encephalopathic and intubated.  PSYCHIATRIC: Encephalopathic and Intubated.  SKIN: No obvious rash, lesion, or ulcer. Sign of chronic venous stasis and some dry excoriating areas in the left foot.   LABORATORY PANEL:   CBC Recent Labs  Lab 03/16/17 0520  WBC 13.9*  HGB 10.0*  HCT 30.5*   PLT 232   ------------------------------------------------------------------------------------------------------------------  Chemistries  Recent Labs  Lab 03/15/17 0436 03/16/17 0520  NA 137 136  K 3.9 3.8  CL 98* 97*  CO2 30 30  GLUCOSE 188* 164*  BUN 36* 58*  CREATININE 3.50* 4.73*  CALCIUM 7.0* 7.0*  MG 2.0  --   AST 169* 126*  ALT 508* 300*  ALKPHOS 135* 164*  BILITOT 0.8 0.6   ------------------------------------------------------------------------------------------------------------------  Cardiac Enzymes Recent Labs  Lab 03/11/17 1241  TROPONINI 1.35*   ------------------------------------------------------------------------------------------------------------------  RADIOLOGY:  Mr Brain Wo Contrast  Result Date: 03/15/2017 CLINICAL DATA:  37 y/o M; found on floor, severe DKA, acute kidney injury, possible sepsis, altered level of consciousness. EXAM: MRI HEAD WITHOUT CONTRAST TECHNIQUE: Multiplanar, multiecho pulse sequences of the brain and surrounding structures were obtained without intravenous contrast. COMPARISON:  03/12/2017 CT head FINDINGS: Brain: Bilateral globe pallidus increased T2 FLAIR signal and reduced diffusion. No hemorrhage. No additional focus of reduced diffusion to suggest acute or early subacute infarct. No mass effect, extra-axial collection, or effacement of basilar cisterns. Vascular: Normal flow voids. Skull and upper cervical spine: Normal marrow signal. Sinuses/Orbits: Paranasal sinus mucosal thickening and mastoid opacification likely due to intubation. Orbits are unremarkable. Other: None. IMPRESSION: Symmetric nonspecific globus pallidus lesions with reduced diffusion. Some differential considerations include sequelae of hypoxic ischemic injury, carbon monoxide/cyanide toxicity, or toxic encephalopathy (notably heroin, MDMA). Electronically Signed   By: Mitzi Hansen M.D.   On: 03/15/2017 22:07   Dg Chest Port 1 View  Result  Date: 03/16/2017  CLINICAL DATA:  Respiratory failure. EXAM: PORTABLE CHEST 1 VIEW COMPARISON:  03/04/2017. FINDINGS: Endotracheal tube, NG tube, right IJ line stable position. Cardiomegaly with normal pulmonary vascularity. Bibasilar atelectasis/infiltrate again noted. Similar findings on prior exam. Tiny left pleural effusion cannot be excluded on today's exam. No pneumothorax. IMPRESSION: 1.  Lines and tubes in stable position. 2. Low lung volumes with bibasilar atelectasis/infiltrates again noted. No significant interim change. Tiny left pleural effusion cannot be excluded on today's exam. Electronically Signed   By: Maisie Fus  Register   On: 03/16/2017 06:39     ASSESSMENT AND PLAN:   37 year old male with past medical history of morbid obesity, diabetes, medical noncompliance, diabetic neuropathy who presented to the hospital due to altered mental status and noted to be in acute rhabdomyolysis and also acute diabetic ketoacidosis.  1. Altered mental status/encephalopathy- pt. Remains encephalopathic and does not respond to verbal stimuli. Off sedation now.  -CT head although was positive for possible subacute CVA. Status MRI of the brain yesterday showing evidence of anoxic brain injury. Neurology discussed plan with patient's mother at bedside. She does not want to pursue tracheostomy/PEG tube. Continue supportive care with Keppra and if not improving consider withdrawal of care within the week.  2. Sepsis-no clear source identified. Continue broad-spectrum IV antibiotics with vancomycin.  -Cultures so far remain negative. Off vasopressors and stress dose steroids. BP improved.  - Follow hemodynamics.  Pt's right ankle wound cultures are + for MRSA but BC remain (-).   3. Acute diabetic ketoacidosis-secondary to patient's noncompliance. -Improved with IV fluids, insulin drip. Anion gap currently closed. Continue on insulin drip per ICU protocol. Follow blood sugars.  4. Acute renal  failure-secondary to sepsis. Patient remains oliguric, seen by nephrology, off CRRT now. No acute indication for hemodialysis and reassess in a.m. As per Nephrology.  -- Renal dose meds, avoid nephrotoxins. Follow urine output.  5. Left upper extremity DVT-continue heparin drip.  6. Acute rhabdomyolysis- continue IV fluid hydration, sodium bicarbonate. -CKs much improved.    7. Abnormal LFTs-secondary to shock liver from septic shock and multiorgan failure. - LFT's improving.   Patient is critically ill with multiorgan failure. Prognosis is poor given MRI Brain showing signs of Anoxic Brain Injury.  Mother is aware of poor prognosis and does not want to Pursue Trach/PEG.  ?? Withdrawal of care soon/?? Consider Palliative care consult to discuss goals of care.   All the records are reviewed and case discussed with Care Management/Social Worker. Management plans discussed with the patient, family and they are in agreement.  CODE STATUS: Full  DVT Prophylaxis: Heparin gtt  TOTAL TIME TAKING CARE OF THIS PATIENT: 30 minutes.   POSSIBLE D/C unclear, DEPENDING ON CLINICAL CONDITION.   Houston Siren M.D on 03/16/2017 at 2:55 PM  Between 7am to 6pm - Pager - [phone]  After 6pm go to [email or web address] - Social research officer, government  [facility]  Office  [phone]  CC: Primary care physician; Patient, No Pcp Per

## 2017-03-16 NOTE — Progress Notes (Deleted)
Pt  Escorted to MRI by 2RN 3RT via bed. RN stayed with  Patient.patient brought back to ICU without any distress. Will continue to observe closely.

## 2017-03-16 NOTE — Progress Notes (Signed)
   03/16/17 1700  Clinical Encounter Type  Visited With Health care provider;Patient not available  Visit Type Spiritual support;Follow-up  Referral From Nurse  Spiritual Encounters  Spiritual Needs Prayer  Referred by RN, offered prayer for patient. Elbert Ewings 5:52 PM

## 2017-03-16 NOTE — Progress Notes (Signed)
Pharmacy Electrolyte Monitoring Consult:  Pharmacy consulted to assist in monitoring and replacing electrolytes in this 37 y.o. male admitted on 03-31-2017 with Loss of Consciousness Patient admitted with unresponsiveness and placed on ventilator, DKA, acute renal failure and possible sepsis.  Patient is now off CRRT.   Electrolytes WNL  Labs:  Sodium (mmol/L)  Date Value  03/16/2017 136   Potassium (mmol/L)  Date Value  03/16/2017 3.8   Magnesium (mg/dL)  Date Value  16/12/9602 2.0   Phosphorus (mg/dL)  Date Value  54/10/8117 3.2   Calcium (mg/dL)  Date Value  14/78/2956 7.0 (L)   Albumin (g/dL)  Date Value  21/30/8657 1.7 (L)    Plan: No additional supplementation needed. Will continue to monitor.  Zachary Graves, PharmD, BCPS 03/16/2017 1:40 PM

## 2017-03-16 NOTE — Progress Notes (Signed)
Pharmacy Antibiotic Note  Zachary Graves is a 37 y.o. male admitted on 03/05/2017 with sepsis.  Pharmacy has been consulted for vancomycin and cefepime dosing.  Regimen changed to vancomycin, ceftriaxone for r/o meningitis, unable to perform spinal tap due to patient's condition.  Patient is now off CRRT.   Plan: Will continue with current Vancomycin 1500mg  Iv q24h and check a vanc trough on 1/15.  01/15 @ 2100 VT 52.0 supratherapeutic. Level was drawn appropriately, appears that patient was placed on CRRT on 1/10 while vanc was dosed at 1.5g q24h, then patient was taken off CRRT 1/14 and dose was not readjusted. Will hold off on vanc and draw a random level w/ am labs then will reassess therapy.  Height: 5\' 9"  (175.3 cm) Weight: (!) 362 lb 14 oz (164.6 kg) IBW/kg (Calculated) : 70.7  Temp (24hrs), Avg:98.6 F (37 C), Min:97.8 F (36.6 C), Max:99.4 F (37.4 C)  Recent Labs  Lab Mar 21, 2017 2327 03/11/17 0113 03/11/17 0428  03/12/17 0321 03/12/17 0533  03/13/17 0359  03/14/17 0514  03/14/17 1039 03/14/17 1457 03/14/17 1830 03/15/17 0436 03/16/17 0520 03/16/17 2102  WBC  --   --  14.4*  --   --  13.1*  --  18.0*  --  13.2*  --   --   --   --  16.7* 13.9*  --   CREATININE  --  2.81* 2.69*   < > 2.59*  --    < > 2.56*   < >  --    < > 2.67* 2.81* 2.97* 3.50* 4.73*  --   LATICACIDVEN 4.2* 4.8* 5.0*  --   --   --   --   --   --   --   --   --   --   --   --   --   --   VANCOTROUGH  --   --   --   --  20  --   --   --   --   --   --   --   --   --   --   --  52*   < > = values in this interval not displayed.    Estimated Creatinine Clearance: 33.1 mL/min (A) (by C-G formula based on SCr of 4.73 mg/dL (H)).    Allergies  Allergen Reactions  . Naproxen   . Sulfa Antibiotics Hives    Antimicrobials this admission: Zosyn 1/9 x 1 Cefepime 1/10 x 1 Vancomycin 1/10 >> CTX 1/10 >> Acyclovir 1/10 >> 1/13  Dose adjustments this admission:   Microbiology results: 1/9 BCx:  NGTD 1/9 UCx: NG 1/9 Sputum: pending  1/10 Wound cx: pending 1/10 MRSA PCR: (+)  Thank you for allowing pharmacy to be a part of this patient's care.  Thomasene Ripple, PharmD, BCPS Clinical Pharmacist 03/16/2017

## 2017-03-16 NOTE — Progress Notes (Signed)
Subjective: No change in exam. Not waking up  Objective: Current vital signs: BP 130/70   Pulse (!) 117   Temp 98.5 F (36.9 C) (Oral)   Resp (!) 25   Ht 5' 9"  (1.753 m)   Wt (!) 362 lb 14 oz (164.6 kg)   SpO2 95%   BMI 53.59 kg/m  Vital signs in last 24 hours: Temp:  [97.7 F (36.5 C)-99.4 F (37.4 C)] 98.5 F (36.9 C) (01/15 0753) Pulse Rate:  [74-128] 117 (01/15 1100) Resp:  [2-31] 25 (01/15 1100) BP: (107-172)/(57-112) 130/70 (01/15 1100) SpO2:  [89 %-99 %] 95 % (01/15 1100) FiO2 (%):  [40 %] 40 % (01/15 1051) Weight:  [362 lb 14 oz (164.6 kg)] 362 lb 14 oz (164.6 kg) (01/15 0500)  Intake/Output from previous day: 01/14 0701 - 01/15 0700 In: 1968.7 [I.V.:773.6; NG/GT:435.2; IV Piggyback:760] Out: 55 [Urine:50; Emesis/NG output:5] Intake/Output this shift: Total I/O In: 548.5 [I.V.:380.4; NG/GT:168.2] Out: 41 [Urine:41] Nutritional status: No diet orders on file  Neurologic Exam: Mental Status: Withdraws from painful stimuli  Cranial Nerves: II: patient does not respond confrontation bilaterally, pupils right 3 mm, left 3 mm,and reactive bilaterally III,IV,VI: doll's response absent bilaterally. V,VII: corneal reflex reduced bilaterally  VIII: patient does not respond to verbal stimuli IX,X: gag reflex reduced, XI: trapezius strength unable to test bilaterally XII: tongue strength unable to test Motor: Extremities flaccid throughout.  No spontaneous movement noted.  No purposeful movements noted. Sensory: Does not respond to noxious stimuli in any extremity.   Lab Results: Basic Metabolic Panel: Recent Labs  Lab 03/14/17 0634 03/14/17 1039 03/14/17 1457 03/14/17 1830 03/15/17 0436 03/16/17 0520  NA 135 136 136 136 137 136  K 3.9 4.1 3.9 3.8 3.9 3.8  CL 99* 99* 98* 98* 98* 97*  CO2 27 28 29 29 30 30   GLUCOSE 209* 200* 212* 184* 188* 164*  BUN 26* 25* 28* 29* 36* 58*  CREATININE 2.66* 2.67* 2.81* 2.97* 3.50* 4.73*  CALCIUM 7.1* 7.1* 7.2* 6.9*  7.0* 7.0*  MG 1.9 1.8 2.0 1.9 2.0  --   PHOS 2.9 3.0 2.9 3.1 3.2  --     Liver Function Tests: Recent Labs  Lab 04/01/2017 2012 03/11/17 0428  03/12/17 1244  03/14/17 1039 03/14/17 1457 03/14/17 1830 03/15/17 0436 03/16/17 0520  AST 4,855* >10,000*  --  2,115*  --  282*  --   --  169* 126*  ALT 2,250* 3,543*  --  2,181*  --  772*  --   --  508* 300*  ALKPHOS 279*  --   --  110  --  132*  --   --  135* 164*  BILITOT 1.2  --   --  0.6  --  0.8  --   --  0.8 0.6  PROT 8.3*  --   --  4.6*  --  5.1*  --   --  5.0* 5.0*  ALBUMIN 3.6  --    < > 1.8*  1.8*   < > 1.7*  1.8* 1.7* 1.7* 1.7* 1.7*   < > = values in this interval not displayed.   Recent Labs  Lab 03/11/17 1543  LIPASE 1,539*  AMYLASE 789*   No results for input(s): AMMONIA in the last 168 hours.  CBC: Recent Labs  Lab 03/02/2017 2012  03/12/17 0533 03/13/17 0359 03/14/17 0514 03/15/17 0436 03/16/17 0520  WBC 13.4*   < > 13.1* 18.0* 13.2* 16.7* 13.9*  NEUTROABS 11.5*  --  11.2*  --   --   --   --   HGB 15.5   < > 13.0 13.9 11.3* 10.5* 10.0*  HCT 54.0*   < > 40.5 42.3 33.8* 31.7* 30.5*  MCV 94.1   < > 83.9 83.2 82.5 82.6 83.3  PLT 379   < > 224 226 153 205 232   < > = values in this interval not displayed.    Cardiac Enzymes: Recent Labs  Lab 03/11/17 0113 03/11/17 0840 03/11/17 1241  03/12/17 1524 03/13/17 0359 03/13/17 1503 03/14/17 0304 03/14/17 1039  CKTOTAL  --   --   --    < > 35,820* 19,835* 11,678* 7,822* 3,652*  TROPONINI 0.67* 1.46* 1.35*  --   --   --   --   --   --    < > = values in this interval not displayed.    Lipid Panel: No results for input(s): CHOL, TRIG, HDL, CHOLHDL, VLDL, LDLCALC in the last 168 hours.  CBG: Recent Labs  Lab 03/16/17 0728 03/16/17 0830 03/16/17 0939 03/16/17 1033 03/16/17 1138  GLUCAP 169* 155* 160* 172* 190*    Microbiology: Results for orders placed or performed during the hospital encounter of 03/29/2017  Urine culture     Status: None    Collection Time: 03/08/2017  8:12 PM  Result Value Ref Range Status   Specimen Description   Final    URINE, RANDOM Performed at [facility], [address], [place], [place] [address]    Special Requests   Final    NONE Performed at [facility], [address] [place], [place] [address]    Culture   Final    NO GROWTH Performed at [facility], [place] [address], [place], [place] [address]    Report Status 03/12/2017 FINAL  Final  Blood Culture (routine x 2)     Status: None   Collection Time: 03/05/2017 11:28 PM  Result Value Ref Range Status   Specimen Description BLOOD LT [facility]  Final   Special Requests   Final    BOTTLES DRAWN AEROBIC AND ANAEROBIC Blood Culture adequate volume   Culture   Final    NO GROWTH 5 DAYS Performed at [facility], [address] [place], [place] [address]    Report Status 03/15/2017 FINAL  Final  Blood Culture (routine x 2)     Status: None   Collection Time: 03/20/2017 11:28 PM  Result Value Ref Range Status   Specimen Description BLOOD RT [facility]  Final   Special Requests   Final    BOTTLES DRAWN AEROBIC AND ANAEROBIC Blood Culture adequate volume   Culture   Final    NO GROWTH 5 DAYS Performed at [facility], [address] [place], [place] [address]    Report Status 03/15/2017 FINAL  Final  MRSA PCR Screening     Status: Abnormal   Collection Time: 03/11/17 12:20 AM  Result Value Ref Range Status   MRSA by PCR POSITIVE (A) NEGATIVE Final    Comment:        The GeneXpert MRSA Assay (FDA approved for NASAL specimens only), is one component of a comprehensive MRSA colonization surveillance program. It is not intended to diagnose MRSA infection nor to guide or monitor treatment for MRSA infections. RESULT CALLED TO, READ BACK BY AND VERIFIED WITH: BARBARA THAO ON 03/11/17 AT 0310 JAG Performed at [facility], [address] [place], [place] [address]  Aerobic/Anaerobic Culture  (surgical/deep wound)     Status: None   Collection Time: 03/11/17 12:53 AM  Result Value Ref Range Status   Specimen Description   Final    ANKLE RIGHT ANKLE Performed at [facility], [address] [place], [place] [address]    Special Requests   Final    NONE Performed at [facility], [address] [place], [place] [address]    Gram Stain   Final    RARE WBC PRESENT, PREDOMINANTLY PMN FEW GRAM POSITIVE RODS RARE GRAM POSITIVE COCCI IN PAIRS RARE YEAST    Culture   Final    ABUNDANT METHICILLIN RESISTANT STAPHYLOCOCCUS AUREUS MODERATE GROUP B STREP(S.AGALACTIAE)ISOLATED TESTING AGAINST S. AGALACTIAE NOT ROUTINELY PERFORMED DUE TO PREDICTABILITY OF AMP/PEN/VAN SUSCEPTIBILITY. FEW CANDIDA TROPICALIS NO ANAEROBES ISOLATED Performed at [facility], [place] [address], [place], [place] [address]    Report Status 03/15/2017 FINAL  Final   Organism ID, Bacteria METHICILLIN RESISTANT STAPHYLOCOCCUS AUREUS  Final      Susceptibility   Methicillin resistant staphylococcus aureus - MIC*    CIPROFLOXACIN >=8 RESISTANT Resistant     ERYTHROMYCIN <=0.25 SENSITIVE Sensitive     GENTAMICIN <=0.5 SENSITIVE Sensitive     OXACILLIN RESISTANT Resistant     TETRACYCLINE <=1 SENSITIVE Sensitive     VANCOMYCIN <=0.5 SENSITIVE Sensitive     TRIMETH/SULFA <=10 SENSITIVE Sensitive     CLINDAMYCIN <=0.25 SENSITIVE Sensitive     RIFAMPIN <=0.5 SENSITIVE Sensitive     Inducible Clindamycin NEGATIVE Sensitive     * ABUNDANT METHICILLIN RESISTANT STAPHYLOCOCCUS AUREUS    Coagulation Studies: No results for input(s): LABPROT, INR in the last 72 hours.  Imaging: Mr Brain Wo Contrast  Result Date: 03/15/2017 CLINICAL DATA:  37 y/o M; found on floor, severe DKA, acute kidney injury, possible sepsis, altered level of consciousness. EXAM: MRI HEAD WITHOUT CONTRAST TECHNIQUE: Multiplanar, multiecho pulse sequences of the brain and surrounding structures were obtained without  intravenous contrast. COMPARISON:  03/12/2017 CT head FINDINGS: Brain: Bilateral globe pallidus increased T2 FLAIR signal and reduced diffusion. No hemorrhage. No additional focus of reduced diffusion to suggest acute or early subacute infarct. No mass effect, extra-axial collection, or effacement of basilar cisterns. Vascular: Normal flow voids. Skull and upper cervical spine: Normal marrow signal. Sinuses/Orbits: Paranasal sinus mucosal thickening and mastoid opacification likely due to intubation. Orbits are unremarkable. Other: None. IMPRESSION: Symmetric nonspecific globus pallidus lesions with reduced diffusion. Some differential considerations include sequelae of hypoxic ischemic injury, carbon monoxide/cyanide toxicity, or toxic encephalopathy (notably heroin, MDMA). Electronically Signed   By: Kristine Garbe M.D.   On: 03/15/2017 22:07   Dg Chest Port 1 View  Result Date: 03/16/2017 CLINICAL DATA:  Respiratory failure. EXAM: PORTABLE CHEST 1 VIEW COMPARISON:  03/04/2017. FINDINGS: Endotracheal tube, NG tube, right IJ line stable position. Cardiomegaly with normal pulmonary vascularity. Bibasilar atelectasis/infiltrate again noted. Similar findings on prior exam. Tiny left pleural effusion cannot be excluded on today's exam. No pneumothorax. IMPRESSION: 1.  Lines and tubes in stable position. 2. Low lung volumes with bibasilar atelectasis/infiltrates again noted. No significant interim change. Tiny left pleural effusion cannot be excluded on today's exam. Electronically Signed   By: Marcello Moores  Register   On: 03/16/2017 06:39    Medications:  I have reviewed the patient's current medications. Scheduled: . alteplase  2 mg Intracatheter Once  . alteplase  2 mg Intracatheter Once  . chlorhexidine gluconate (MEDLINE KIT)  15 mL Mouth Rinse BID  .  feeding supplement (PRO-STAT SUGAR FREE 64)  60 mL Per Tube BID  . feeding supplement (VITAL HIGH PROTEIN)  1,000 mL Per Tube Q24H  . mouth rinse   15 mL Mouth Rinse 10 times per day  . multivitamin  15 mL Per Tube Daily  . mupirocin ointment  1 application Nasal BID    Assessment/Plan: Not waking up and not following commands MRI consistent with subcortical anoxia specifically bilateral globus pallidus which is relay center Spoke to mother at bedside and imaging explained. She appears to understand the current situation well and stated he would not want to live in this matter or on mashies.  She does not want to proceed trach/peg route Possible withdrawal of care at the end of the week if no improvement Keppra 500 BID on board     LOS: 6 days    03/16/2017  12:19 PM

## 2017-03-16 NOTE — Progress Notes (Signed)
[place], [place] 03/16/17  Subjective:   Patient's mother at bedside.  UOP 71 cc Patient is critically ill and remains intubated and sedated No longer on pressors.  Getting iv heparin and iv insulin Tube feeds are continued 45 cc/hr FIO2 40%; PEEP 5   Objective:  Vital signs in last 24 hours:  Temp:  [97.7 F (36.5 C)-99.4 F (37.4 C)] 98.5 F (36.9 C) (01/15 0753) Pulse Rate:  [74-130] 115 (01/15 0800) Resp:  [2-31] 28 (01/15 0800) BP: (108-172)/(57-112) 164/112 (01/15 0800) SpO2:  [89 %-99 %] 94 % (01/15 0842) FiO2 (%):  [40 %] 40 % (01/15 0842) Weight:  [164.6 kg (362 lb 14 oz)] 164.6 kg (362 lb 14 oz) (01/15 0500)  Weight change: 0.1 kg (3.5 oz) Filed Weights   03/14/17 0500 03/15/17 0500 03/16/17 0500  Weight: (!) 164.6 kg (362 lb 14 oz) (!) 164.5 kg (362 lb 10.5 oz) (!) 164.6 kg (362 lb 14 oz)    Intake/Output:    Intake/Output Summary (Last 24 hours) at 03/16/2017 0903 Last data filed at 03/16/2017 0753 Gross per 24 hour  Intake 2255.47 ml  Output 71 ml  Net 2184.47 ml     Physical Exam: General: Lying in bed. Intubated.   HEENT Normocephalic. Pupils equal.   Neck Supple  Pulm/lungs Clear to auscultation   CVS/Heart Regular rhythm. Tachycardic. No murmurs   Abdomen:  Soft   Extremities: Left upper extremity noticably more swollen. General edema.   Neurologic: Does not follow commands   Skin: brusing on left pinna and temporal area. Bluish discoloration, blisters and weeping on left forearm. Discoloration on right great toe. Dressing on right ankle   Access: Right Femoral catheter    Foley in place    Basic Metabolic Panel:  Recent Labs  Lab 03/14/17 0634 03/14/17 1039 03/14/17 1457 03/14/17 1830 03/15/17 0436 03/16/17 0520  NA 135 136 136 136 137 136  K 3.9 4.1 3.9 3.8 3.9 3.8  CL 99* 99* 98* 98* 98* 97*  CO2 27 28 29 29 30 30   GLUCOSE 209* 200* 212* 184* 188* 164*  BUN 26* 25* 28* 29* 36* 58*  CREATININE  2.66* 2.67* 2.81* 2.97* 3.50* 4.73*  CALCIUM 7.1* 7.1* 7.2* 6.9* 7.0* 7.0*  MG 1.9 1.8 2.0 1.9 2.0  --   PHOS 2.9 3.0 2.9 3.1 3.2  --      CBC: Recent Labs  Lab 03/23/2017 2012  03/12/17 0533 03/13/17 0359 03/14/17 0514 03/15/17 0436 03/16/17 0520  WBC 13.4*   < > 13.1* 18.0* 13.2* 16.7* 13.9*  NEUTROABS 11.5*  --  11.2*  --   --   --   --   HGB 15.5   < > 13.0 13.9 11.3* 10.5* 10.0*  HCT 54.0*   < > 40.5 42.3 33.8* 31.7* 30.5*  MCV 94.1   < > 83.9 83.2 82.5 82.6 83.3  PLT 379   < > 224 226 153 205 232   < > = values in this interval not displayed.     No results found for: HEPBSAG, HEPBSAB, HEPBIGM    Microbiology:  Recent Results (from the past 240 hour(s))  Urine culture     Status: None   Collection Time: 03/06/2017  8:12 PM  Result Value Ref Range Status   Specimen Description   Final    URINE, RANDOM Performed at [facility], [address] [place],  [address]    Special Requests   Final  NONE Performed at [facility], [address] [place], [place] [address]    Culture   Final    NO GROWTH Performed at [facility], [place] [address], [place], [place] [address]    Report Status 03/12/2017 FINAL  Final  Blood Culture (routine x 2)     Status: None   Collection Time: 03/28/2017 11:28 PM  Result Value Ref Range Status   Specimen Description BLOOD LT [facility]  Final   Special Requests   Final    BOTTLES DRAWN AEROBIC AND ANAEROBIC Blood Culture adequate volume   Culture   Final    NO GROWTH 5 DAYS Performed at [facility], [address] [place], [place] [address]    Report Status 03/15/2017 FINAL  Final  Blood Culture (routine x 2)     Status: None   Collection Time: 03/27/2017 11:28 PM  Result Value Ref Range Status   Specimen Description BLOOD RT [facility]  Final   Special Requests   Final    BOTTLES DRAWN AEROBIC AND ANAEROBIC Blood Culture adequate volume   Culture   Final    NO GROWTH 5 DAYS Performed at [facility], [address], [place], [place] [address]    Report Status 03/15/2017 FINAL  Final  MRSA PCR Screening     Status: Abnormal   Collection Time: 03/11/17 12:20 AM  Result Value Ref Range Status   MRSA by PCR POSITIVE (A) NEGATIVE Final    Comment:        The GeneXpert MRSA Assay (FDA approved for NASAL specimens only), is one component of a comprehensive MRSA colonization surveillance program. It is not intended to diagnose MRSA infection nor to guide or monitor treatment for MRSA infections. RESULT CALLED TO, READ BACK BY AND VERIFIED WITH: BARBARA THAO ON 03/11/17 AT 0310 JAG Performed at [facility], [address] [place], [place] [address]   Aerobic/Anaerobic Culture (surgical/deep wound)     Status: None   Collection Time: 03/11/17 12:53 AM  Result Value Ref Range Status   Specimen Description   Final    ANKLE RIGHT ANKLE Performed at [facility], [address], [place], [place] [address]    Special Requests   Final    NONE Performed at [facility], [address] [place], [place] [address]    Gram Stain   Final    RARE WBC PRESENT, PREDOMINANTLY PMN FEW GRAM POSITIVE RODS RARE GRAM POSITIVE COCCI IN PAIRS RARE YEAST    Culture   Final    ABUNDANT METHICILLIN RESISTANT STAPHYLOCOCCUS AUREUS MODERATE GROUP B STREP(S.AGALACTIAE)ISOLATED TESTING AGAINST S. AGALACTIAE NOT ROUTINELY PERFORMED DUE TO PREDICTABILITY OF AMP/PEN/VAN SUSCEPTIBILITY. FEW CANDIDA TROPICALIS NO ANAEROBES ISOLATED Performed at [facility], [place] [address], [place], [place] [address]    Report Status 03/15/2017 FINAL  Final   Organism ID, Bacteria METHICILLIN RESISTANT STAPHYLOCOCCUS AUREUS  Final      Susceptibility   Methicillin resistant staphylococcus aureus - MIC*    CIPROFLOXACIN >=8 RESISTANT Resistant     ERYTHROMYCIN <=0.25 SENSITIVE Sensitive     GENTAMICIN <=0.5 SENSITIVE Sensitive     OXACILLIN RESISTANT Resistant      TETRACYCLINE <=1 SENSITIVE Sensitive     VANCOMYCIN <=0.5 SENSITIVE Sensitive     TRIMETH/SULFA <=10 SENSITIVE Sensitive     CLINDAMYCIN <=0.25 SENSITIVE Sensitive     RIFAMPIN <=0.5 SENSITIVE Sensitive     Inducible Clindamycin NEGATIVE Sensitive     *  ABUNDANT METHICILLIN RESISTANT STAPHYLOCOCCUS AUREUS    Coagulation Studies: No results for input(s): LABPROT, INR in the last 72 hours.  Urinalysis: No results for input(s): COLORURINE, LABSPEC, PHURINE, GLUCOSEU, HGBUR, BILIRUBINUR, KETONESUR, PROTEINUR, UROBILINOGEN, NITRITE, LEUKOCYTESUR in the last 72 hours.  Invalid input(s): APPERANCEUR    Imaging: Mr Brain Wo Contrast  Result Date: 03/15/2017 CLINICAL DATA:  37 y/o M; found on floor, severe DKA, acute kidney injury, possible sepsis, altered level of consciousness. EXAM: MRI HEAD WITHOUT CONTRAST TECHNIQUE: Multiplanar, multiecho pulse sequences of the brain and surrounding structures were obtained without intravenous contrast. COMPARISON:  03/12/2017 CT head FINDINGS: Brain: Bilateral globe pallidus increased T2 FLAIR signal and reduced diffusion. No hemorrhage. No additional focus of reduced diffusion to suggest acute or early subacute infarct. No mass effect, extra-axial collection, or effacement of basilar cisterns. Vascular: Normal flow voids. Skull and upper cervical spine: Normal marrow signal. Sinuses/Orbits: Paranasal sinus mucosal thickening and mastoid opacification likely due to intubation. Orbits are unremarkable. Other: None. IMPRESSION: Symmetric nonspecific globus pallidus lesions with reduced diffusion. Some differential considerations include sequelae of hypoxic ischemic injury, carbon monoxide/cyanide toxicity, or toxic encephalopathy (notably heroin, MDMA). Electronically Signed   By: Kristine Garbe M.D.   On: 03/15/2017 22:07   Dg Chest Port 1 View  Result Date: 03/16/2017 CLINICAL DATA:  Respiratory failure. EXAM: PORTABLE CHEST 1 VIEW COMPARISON:   03/04/2017. FINDINGS: Endotracheal tube, NG tube, right IJ line stable position. Cardiomegaly with normal pulmonary vascularity. Bibasilar atelectasis/infiltrate again noted. Similar findings on prior exam. Tiny left pleural effusion cannot be excluded on today's exam. No pneumothorax. IMPRESSION: 1.  Lines and tubes in stable position. 2. Low lung volumes with bibasilar atelectasis/infiltrates again noted. No significant interim change. Tiny left pleural effusion cannot be excluded on today's exam. Electronically Signed   By: Marcello Moores  Register   On: 03/16/2017 06:39   Dg Chest Port 1 View  Result Date: 03/14/2017 CLINICAL DATA:  Ventilator EXAM: PORTABLE CHEST 1 VIEW COMPARISON:  03/12/2017 FINDINGS: Low lung volumes with bibasilar atelectasis. Endotracheal tube, right central line are unchanged. Interval placement of NG tube into the stomach. Heart is borderline in size. No effusions. IMPRESSION: Low lung volumes, bibasilar atelectasis. Electronically Signed   By: Rolm Baptise M.D.   On: 03/14/2017 11:23     Medications:   . sodium chloride    . cefTRIAXone (ROCEPHIN)  IV Stopped (03/16/17 0216)  . famotidine (PEPCID) IV Stopped (03/16/17 0139)  . fentaNYL Stopped (03/16/17 0748)  . heparin 2,400 Units/hr (03/16/17 6195)  . insulin (NOVOLIN-R) infusion 7.6 Units/hr (03/16/17 0831)  . levETIRAcetam Stopped (03/16/17 0215)  . vancomycin Stopped (03/16/17 0024)   . alteplase  2 mg Intracatheter Once  . alteplase  2 mg Intracatheter Once  . chlorhexidine gluconate (MEDLINE KIT)  15 mL Mouth Rinse BID  . feeding supplement (PRO-STAT SUGAR FREE 64)  60 mL Per Tube BID  . feeding supplement (VITAL HIGH PROTEIN)  1,000 mL Per Tube Q24H  . mouth rinse  15 mL Mouth Rinse 10 times per day  . multivitamin  15 mL Per Tube Daily  . mupirocin ointment  1 application Nasal BID   sodium chloride, acetaminophen, midazolam, midazolam, ondansetron (ZOFRAN) IV  Assessment/ Plan:  37 y.o.caucasian  male  with insulin dependent diabetes mellitus, hypertension, morbid obesity, diabetic peripheral neuropathy, Legg-calve-Perthes Disease, osteoarthritis, who was admitted to [facility] on1/9/2019for diabetic ketoacidosis.   1. Acute renal failure. Poor urine output.  K is normal; bicarb is normal  Electrolytes and  Volume status are acceptable No acute indication for Dialysis at present Will continue to monitor.  Consider trial of lasix  2. DKA. Poorly controlled insulin dependent diabetes. Continue management bu ICU team.   3. Rhabdomyolysis. Required HD. CK level has continued to decrease since admission. Last check on 1/13.   4. Generalized edema. Will monitor.   5. Acute respiratory failure  requiring ventilator support. Fio2 40%    LOS: 6 Veasna Santibanez 1/15/20199:03 AM  [place], [place]

## 2017-03-16 NOTE — Progress Notes (Signed)
PULMONARY / CRITICAL CARE MEDICINE   Name: Zachary Graves MRN: [id number] DOB: 1981/01/23    ADMISSION DATE:  03/24/2017  PT PROFILE:   65 M with PMH of DM, obesity, chronic R foot ulceration presented to ED 01/09 with unresponsiveness requiring intubation and found to be in severe DKA with MOSF  MAJOR EVENTS/TEST RESULTS: 01/09 admitted as above. Intubated in ED. DKA protocol, vasopressors, HCO3 infusion, empiric antibiotics. Tox screnn + for opiates 01/10 LUE [place]: positive for occlusive DVT extending from the peripheral aspect of the left subclavian vein to involve the left axillary and one of the paired left brachial veins 01/10 CT head: Normal noncontrast CT of the brain. Moderate left temporal subcutaneous hematoma 01/10 Neurology consultation: "Due to the fact that CNS infection remains in the differential broad-spectrum antibiotic coverage is indicated". Meningitis coverage initiated 01/10 Vasc surg consultation: "Left upper extremity with signs of ischemia.  This is likely multifactorial on 3 different pressor agents with continued hypotension.  At this point, the patient is hemodynamically unstable and on maximal support and would not be a candidate for any sort of surgical or interventional therapy.  If he can tolerate anticoagulation, this can certainly be considered" 01/10 Heparin infusion initiated 01/10 Nephrology consultation: anuria noted. CRRT initiaited 01/11 repeat CT head: Focal areas of low attenuation in the internal capsule medial to the lentiform nuclei bilaterally, new compared to the prior CT and may represent ischemia. Further evaluation with MRI recommended 01/13 CRRT discontinued with plan for intermittent HD thereafter 01/14 MRI brain: Symmetric nonspecific globus pallidus lesions with reduced diffusion. Some differential considerations include sequelae of hypoxic ischemic injury, carbon monoxide/cyanide toxicity, or toxic encephalopathy  01/15 Comatose off of all  sedation. Remains on insulin and heparin infusions.  01/15 Neurology conveyed poor prognosis to pt's mother. She indicated that she would not wish to pursue trach tube or G tube 01/15 EEG:    INDWELLING DEVICES:: R IJ CVL 01/09 >> 01/10 ETT 01/09 >>  L femoral CVL 01/10 >>  R IJ HD cath 01/10 >>   MICRO DATA: R ankle wound 01/10 >> abundant MRSA  Urine 01/09 >> NEG Blood 01/09 >> NEG  ANTIMICROBIALS:   Pip-tazo 01/09 >> 01/10 Ceftriaxone 01/10 >> 01/15 Vanc 01/09 >>   SUBJECTIVE:  Comatose off of all sedatives  VITAL SIGNS: BP (!) 116/59 (BP Location: Right Arm)   Pulse (!) 116   Temp 98.5 F (36.9 C) (Oral)   Resp (!) 26   Ht 5\' 9"  (1.753 m)   Wt (!) 164.6 kg (362 lb 14 oz)   SpO2 97%   BMI 53.59 kg/m   HEMODYNAMICS:    VENTILATOR SETTINGS: Vent Mode: Spontaneous FiO2 (%):  [40 %] 40 % PEEP:  [5 cmH20] 5 cmH20 Pressure Support:  [8 cmH20-10 cmH20] 8 cmH20 Plateau Pressure:  [14 cmH20] 14 cmH20  INTAKE / OUTPUT: I/O last 3 completed shifts: In: 5035.1 [I.V.:2575; NG/GT:825.2; IV Piggyback:1635] Out: 90 [Urine:85; Emesis/NG output:5]  PHYSICAL EXAMINATION: General: Comatose, obese, anasarca Neuro: Pupils react symmetrically, no withdrawal, no spontaneous movement HEENT: Ischemic/gangrenous changes on left ear, otherwise NCAT Cardiovascular: Distant at bedtime, no M noted, regular Lungs: Clear anteriorly without adventitious sounds Abdomen: Markedly obese, diminished BS, no palpable masses Extremities: Severe symmetric lower extremity edema.  Ischemic changes on forearm and multiple digits of left hand with blistering  LABS:  BMET Recent Labs  Lab 03/14/17 1830 03/15/17 0436 03/16/17 0520  NA 136 137 136  K 3.8 3.9 3.8  CL 98*  98* 97*  CO2 29 30 30   BUN 29* 36* 58*  CREATININE 2.97* 3.50* 4.73*  GLUCOSE 184* 188* 164*    Electrolytes Recent Labs  Lab 03/14/17 1457 03/14/17 1830 03/15/17 0436 03/16/17 0520  CALCIUM 7.2* 6.9* 7.0* 7.0*   MG 2.0 1.9 2.0  --   PHOS 2.9 3.1 3.2  --     CBC Recent Labs  Lab 03/14/17 0514 03/15/17 0436 03/16/17 0520  WBC 13.2* 16.7* 13.9*  HGB 11.3* 10.5* 10.0*  HCT 33.8* 31.7* 30.5*  PLT 153 205 232    Coag's Recent Labs  Lab 03/11/17 1116  03/13/17 0359 03/14/17 0514 03/15/17 0436 03/16/17 0520  APTT  --    < > 43* 73* 64* 93*  INR 1.34  --  1.22  --   --   --    < > = values in this interval not displayed.    Sepsis Markers Recent Labs  Lab 03/18/17 2327 2017/03/18 2328 03/11/17 0113 03/11/17 0428 03/12/17 0533  LATICACIDVEN 4.2*  --  4.8* 5.0*  --   PROCALCITON  --  6.82  --  8.94 5.80    ABG Recent Labs  Lab 03/12/17 0553 03/12/17 2308 03/14/17 1050  PHART 7.34* 7.41 7.45  PCO2ART 43 40 44  PO2ART 76* 64* 52*    Liver Enzymes Recent Labs  Lab 03/14/17 1039  03/14/17 1830 03/15/17 0436 03/16/17 0520  AST 282*  --   --  169* 126*  ALT 772*  --   --  508* 300*  ALKPHOS 132*  --   --  135* 164*  BILITOT 0.8  --   --  0.8 0.6  ALBUMIN 1.7*  1.8*   < > 1.7* 1.7* 1.7*   < > = values in this interval not displayed.    Cardiac Enzymes Recent Labs  Lab 03/11/17 0113 03/11/17 0840 03/11/17 1241  TROPONINI 0.67* 1.46* 1.35*    Glucose Recent Labs  Lab 03/16/17 0830 03/16/17 0939 03/16/17 1033 03/16/17 1138 03/16/17 1231 03/16/17 1332  GLUCAP 155* 160* 172* 190* 198* 192*    CXR: No interval change    ASSESSMENT / PLAN:  PULMONARY A: Acute respiratory failure -intubated for altered mental status P:   Cont vent support - settings reviewed and/or adjusted PSV as toelrated Cont vent bundle Daily SBT if/when meets criteria   CARDIOVASCULAR A:  Shock, resolved Hypertension P:  MAP goal > 65 mmHg  RENAL A:   AKI, anuric Hypervolemia P:   Monitor BMET intermittently Monitor I/Os Correct electrolytes as indicated  Nephrology following Furosemide trial 01/15  GASTROINTESTINAL A:   Obesity P:   SUP: IV  famotidine Continue TFs at low rate until glycemic control improved  HEMATOLOGIC A:   Mild anemia without acute blood loss LUE DVT LUE arterial ischemia P:  Continue full dose heparin Monitor CBC intermittently Transfuse per usual guidelines   INFECTIOUS A:   MRSA right ankle wound infection Doubt meningitis Doubt pneumonia P:   Monitor temp, WBC count Micro and abx as above   ENDOCRINE A:   DKA resolved Severe insulin resistance Severe hyperglycemia P:   Continue insulin infusion  NEUROLOGIC A:   Severe and anoxic encephalopathy P:   RASS goal: 0, -1 Avoid sedatives if possible Neurology following Continue Keppra   Mother updated at bedside  CCM time: 45 mins The above time includes time spent in consultation with patient and/or family members and reviewing care plan on multidisciplinary rounds  Billy Fischer, MD  PCCM service Mobile [phone] Pager [phone] 03/16/2017, 2:02 PM

## 2017-03-17 ENCOUNTER — Inpatient Hospital Stay: Payer: Medicaid Other

## 2017-03-17 DIAGNOSIS — I6782 Cerebral ischemia: Secondary | ICD-10-CM

## 2017-03-17 LAB — GLUCOSE, CAPILLARY
GLUCOSE-CAPILLARY: 186 mg/dL — AB (ref 65–99)
GLUCOSE-CAPILLARY: 169 mg/dL — AB (ref 65–99)
GLUCOSE-CAPILLARY: 139 mg/dL — AB (ref 65–99)
GLUCOSE-CAPILLARY: 155 mg/dL — AB (ref 65–99)
GLUCOSE-CAPILLARY: 170 mg/dL — AB (ref 65–99)
GLUCOSE-CAPILLARY: 216 mg/dL — AB (ref 65–99)
GLUCOSE-CAPILLARY: 221 mg/dL — AB (ref 65–99)
GLUCOSE-CAPILLARY: 196 mg/dL — AB (ref 65–99)
GLUCOSE-CAPILLARY: 152 mg/dL — AB (ref 65–99)
GLUCOSE-CAPILLARY: 197 mg/dL — AB (ref 65–99)
GLUCOSE-CAPILLARY: 146 mg/dL — AB (ref 65–99)
GLUCOSE-CAPILLARY: 147 mg/dL — AB (ref 65–99)
GLUCOSE-CAPILLARY: 157 mg/dL — AB (ref 65–99)
GLUCOSE-CAPILLARY: 160 mg/dL — AB (ref 65–99)
Glucose-Capillary: 174 mg/dL — ABNORMAL HIGH (ref 65–99)
Glucose-Capillary: 162 mg/dL — ABNORMAL HIGH (ref 65–99)
Glucose-Capillary: 169 mg/dL — ABNORMAL HIGH (ref 65–99)
Glucose-Capillary: 159 mg/dL — ABNORMAL HIGH (ref 65–99)
Glucose-Capillary: 62 mg/dL — ABNORMAL LOW (ref 65–99)
Glucose-Capillary: 156 mg/dL — ABNORMAL HIGH (ref 65–99)
Glucose-Capillary: 165 mg/dL — ABNORMAL HIGH (ref 65–99)
Glucose-Capillary: 147 mg/dL — ABNORMAL HIGH (ref 65–99)
Glucose-Capillary: 190 mg/dL — ABNORMAL HIGH (ref 65–99)

## 2017-03-17 LAB — HEPATIC FUNCTION PANEL
ALBUMIN: 1.9 g/dL — AB (ref 3.5–5.0)
ALK PHOS: 176 U/L — AB (ref 38–126)
ALT: 204 U/L — AB (ref 17–63)
AST: 90 U/L — ABNORMAL HIGH (ref 15–41)
Bilirubin, Direct: <0.1 mg/dL — ABNORMAL LOW (ref 0.1–0.5)
TOTAL PROTEIN: 5.7 g/dL — AB (ref 6.5–8.1)
Total Bilirubin: 0.8 mg/dL (ref 0.3–1.2)

## 2017-03-17 LAB — BASIC METABOLIC PANEL
ANION GAP: 14 (ref 5–15)
BUN: 83 mg/dL — ABNORMAL HIGH (ref 6–20)
CO2: 26 mmol/L (ref 22–32)
Calcium: 7.6 mg/dL — ABNORMAL LOW (ref 8.9–10.3)
Chloride: 95 mmol/L — ABNORMAL LOW (ref 101–111)
Creatinine, Ser: 6.07 mg/dL — ABNORMAL HIGH (ref 0.61–1.24)
GFR, EST AFRICAN AMERICAN: 12 mL/min — AB (ref >60)
GFR, EST NON AFRICAN AMERICAN: 11 mL/min — AB (ref >60)
GLUCOSE: 163 mg/dL — AB (ref 65–99)
POTASSIUM: 4.4 mmol/L (ref 3.5–5.1)
SODIUM: 135 mmol/L (ref 135–145)

## 2017-03-17 LAB — CBC
HCT: 31.4 % — ABNORMAL LOW (ref 40.0–52.0)
Hemoglobin: 10.1 g/dL — ABNORMAL LOW (ref 13.0–18.0)
MCH: 27.1 pg (ref 26.0–34.0)
MCHC: 32.3 g/dL (ref 32.0–36.0)
MCV: 84 fL (ref 80.0–100.0)
PLATELETS: 284 10*3/uL (ref 150–440)
RBC: 3.74 MIL/uL — AB (ref 4.40–5.90)
RDW: 15.8 % — ABNORMAL HIGH (ref 11.5–14.5)
WBC: 15.2 10*3/uL — ABNORMAL HIGH (ref 3.8–10.6)

## 2017-03-17 LAB — HEPARIN LEVEL (UNFRACTIONATED): HEPARIN UNFRACTIONATED: 0.61 [IU]/mL (ref 0.30–0.70)

## 2017-03-17 LAB — VANCOMYCIN, RANDOM
VANCOMYCIN RM: 41
Vancomycin Rm: 31

## 2017-03-17 LAB — APTT: APTT: 86 s — AB (ref 24–36)

## 2017-03-17 MED ORDER — MIDAZOLAM HCL 2 MG/2ML IJ SOLN
2.0000 mg | INTRAMUSCULAR | Status: DC | PRN
  Administered 2017-03-17: 2 mg via INTRAVENOUS
  Filled 2017-03-17: qty 2

## 2017-03-17 MED ORDER — PROPOFOL 1000 MG/100ML IV EMUL
0.0000 ug/kg/min | INTRAVENOUS | Status: DC
  Administered 2017-03-17: 10 ug/kg/min via INTRAVENOUS
  Administered 2017-03-18: 20 ug/kg/min via INTRAVENOUS
  Administered 2017-03-18: 45 ug/kg/min via INTRAVENOUS
  Administered 2017-03-18: 20 ug/kg/min via INTRAVENOUS
  Administered 2017-03-18: 30 ug/kg/min via INTRAVENOUS
  Administered 2017-03-18: 40 ug/kg/min via INTRAVENOUS
  Administered 2017-03-19: 20 ug/kg/min via INTRAVENOUS
  Administered 2017-03-19 (×2): 10 ug/kg/min via INTRAVENOUS
  Administered 2017-03-19: 20 ug/kg/min via INTRAVENOUS
  Administered 2017-03-20: 15 ug/kg/min via INTRAVENOUS
  Filled 2017-03-17 (×10): qty 100

## 2017-03-17 MED ORDER — FENTANYL CITRATE (PF) 100 MCG/2ML IJ SOLN
100.0000 ug | INTRAMUSCULAR | Status: DC | PRN
  Administered 2017-03-17: 100 ug via INTRAVENOUS
  Filled 2017-03-17: qty 2

## 2017-03-17 MED ORDER — ALBUMIN HUMAN 25 % IV SOLN
12.5000 g | Freq: Once | INTRAVENOUS | Status: AC
  Administered 2017-03-17: 12.5 g via INTRAVENOUS
  Filled 2017-03-17: qty 50

## 2017-03-17 MED ORDER — HYDRALAZINE HCL 20 MG/ML IJ SOLN
10.0000 mg | Freq: Four times a day (QID) | INTRAMUSCULAR | Status: DC | PRN
  Administered 2017-03-17 – 2017-03-18 (×2): 10 mg via INTRAVENOUS
  Filled 2017-03-17 (×2): qty 1

## 2017-03-17 MED ORDER — LACTULOSE 10 GM/15ML PO SOLN
20.0000 g | Freq: Three times a day (TID) | ORAL | Status: DC
  Administered 2017-03-17 (×2): 20 g via ORAL
  Filled 2017-03-17 (×3): qty 30

## 2017-03-17 NOTE — Progress Notes (Signed)
Post dialysis assessment 

## 2017-03-17 NOTE — Progress Notes (Signed)
Hemodialysis treatment started. 

## 2017-03-17 NOTE — Progress Notes (Signed)
ANTICOAGULATION CONSULT NOTE  Pharmacy Consult for Heparin Indication: upper extremity DVT  Allergies  Allergen Reactions  . Naproxen   . Sulfa Antibiotics Hives    Patient Measurements: Height: 5\' 9"  (175.3 cm) Weight: (!) 365 lb 11.9 oz (165.9 kg) IBW/kg (Calculated) : 70.7 Heparin Dosing Weight: 105 kg  Vital Signs: Temp: 99.2 F (37.3 C) (01/16 0400) Temp Source: Oral (01/16 0400) BP: 161/79 (01/16 0600) Pulse Rate: 111 (01/16 0600)  Labs: Recent Labs    03/14/17 1039  03/14/17 1830  03/15/17 0436 03/16/17 0520 03/16/17 1137 03/17/17 0459  HGB  --   --   --   --  10.5* 10.0*  --   --   HCT  --   --   --   --  31.7* 30.5*  --   --   PLT  --   --   --   --  205 232  --   --   APTT  --   --   --   --  64* 93*  --  86*  HEPARINUNFRC  --   --   --    < > 0.44 0.68 0.60 0.61  CREATININE 2.67*   < > 2.97*  --  3.50* 4.73*  --   --   CKTOTAL 3,652*  --   --   --   --   --   --   --    < > = values in this interval not displayed.    Estimated Creatinine Clearance: 33.2 mL/min (A) (by C-G formula based on SCr of 4.73 mg/dL (H)).   Medical History: Past Medical History:  Diagnosis Date  . Back pain   . Diabetes 1.5, managed as type 1 (HCC)   . Legg-Calve-Perthes disease   . Neuropathic pain   . Obesity   . Osteoarthritis     Assessment: 37 y/o M admitted with unresponsiveness and ventilated with DKA and found to have upper extremity DVT.   Heparin infusing at 2400 units/hr HL = 0.60, therapeutic.   Goal of Therapy:  Heparin level 0.3-0.7 units/ml Monitor platelets by anticoagulation protocol: Yes   Plan:  Will continue with current rate. Heparin level and CBC with AM labs.  01/16 0500 HL 0.61 therapeutic. Will continue current rate and will recheck HL w/ am labs.  Thomasene Ripple, PharmD, BCPS Clinical Pharmacist 03/17/2017

## 2017-03-17 NOTE — Progress Notes (Signed)
Pre hemodialysis assessment 

## 2017-03-17 NOTE — Progress Notes (Signed)
PULMONARY / CRITICAL CARE MEDICINE   Name: Zachary Graves MRN: [id number] DOB: 02/23/1981    ADMISSION DATE:  03/09/2017  PT PROFILE:   23 M with PMH of DM, obesity, chronic R foot ulceration presented to ED 01/09 with unresponsiveness requiring intubation and found to be in severe DKA with MOSF  MAJOR EVENTS/TEST RESULTS: 01/09 admitted as above. Intubated in ED. DKA protocol, vasopressors, HCO3 infusion, empiric antibiotics. Tox screnn + for opiates 01/10 LUE [place]: positive for occlusive DVT extending from the peripheral aspect of the left subclavian vein to involve the left axillary and one of the paired left brachial veins 01/10 CT head: Normal noncontrast CT of the brain. Moderate left temporal subcutaneous hematoma 01/10 Neurology consultation: "Due to the fact that CNS infection remains in the differential broad-spectrum antibiotic coverage is indicated". Meningitis coverage initiated 01/10 EEG: Diffuse slowing, no epileptiform activity 01/10 Vasc surg consultation: "Left upper extremity with signs of ischemia.  This is likely multifactorial on 3 different pressor agents with continued hypotension.  At this point, the patient is hemodynamically unstable and on maximal support and would not be a candidate for any sort of surgical or interventional therapy.  If he can tolerate anticoagulation, this can certainly be considered" 01/10 Heparin infusion initiated 01/10 Nephrology consultation: anuria noted. CRRT initiaited 01/11 repeat CT head: Focal areas of low attenuation in the internal capsule medial to the lentiform nuclei bilaterally, new compared to the prior CT and may represent ischemia. Further evaluation with MRI recommended 01/13 CRRT discontinued with plan for intermittent HD thereafter 01/14 MRI brain: Symmetric nonspecific globus pallidus lesions with reduced diffusion. Some differential considerations include sequelae of hypoxic ischemic injury, carbon monoxide/cyanide toxicity, or  toxic encephalopathy  01/15 Comatose off of all sedation. Remains on insulin and heparin infusions.  01/15 Neurology conveyed poor prognosis to pt's mother. She indicated that she would not wish to pursue trach tube or G tube 01/15 EEG:  abnormal electroencephalogram secondary to a discontinuous background activity that is slow and poorly organized.  There vertex sharp activity noted may represent epileptiform transients versus poorly formed sleep transients.  Clinical correlation recommended 01/16 Remains comatose. Plan HD X 2 days to see if any improvement in cognition. If not, will discuss terminal extubation 01/18. Mother apprised  INDWELLING DEVICES:: R IJ CVL 01/09 >> 01/10 ETT 01/09 >>  L femoral CVL 01/10 >>  R IJ HD cath 01/10 >>   MICRO DATA: R ankle wound 01/10 >> abundant MRSA  Urine 01/09 >> NEG Blood 01/09 >> NEG  ANTIMICROBIALS:   Pip-tazo 01/09 >> 01/10 Ceftriaxone 01/10 >> 01/15 Vanc 01/09 >>   SUBJECTIVE:  Remains comatose off of all sedatives  VITAL SIGNS: BP (!) 149/75   Pulse (!) 111   Temp 99.2 F (37.3 C) (Axillary)   Resp 14   Ht 5\' 9"  (1.753 m)   Wt (!) 165.9 kg (365 lb 11.9 oz)   SpO2 96%   BMI 54.01 kg/m   HEMODYNAMICS:    VENTILATOR SETTINGS: Vent Mode: PSV FiO2 (%):  [35 %-40 %] 40 % Pressure Support:  [8 cmH20] 8 cmH20  INTAKE / OUTPUT: I/O last 3 completed shifts: In: 3102.9 [I.V.:1260.4; NG/GT:772.5; IV Piggyback:1070] Out: 154 [Urine:154]  PHYSICAL EXAMINATION: General: Comatose, obese, anasarca Neuro: Pupils react symmetrically, no withdrawal, no spontaneous movement HEENT: Ischemic/gangrenous changes on left ear Cardiovascular: Distant HS, no M noted, regular Lungs: Clear anteriorly without adventitious sounds Abdomen: Markedly obese, diminished BS, no palpable masses Extremities: Severe symmetric lower extremity  edema.  Ischemic changes on forearm and multiple digits of left hand with blistering  LABS:  BMET Recent Labs   Lab 03/15/17 0436 03/16/17 0520 03/17/17 0459  NA 137 136 135  K 3.9 3.8 4.4  CL 98* 97* 95*  CO2 30 30 26   BUN 36* 58* 83*  CREATININE 3.50* 4.73* 6.07*  GLUCOSE 188* 164* 163*    Electrolytes Recent Labs  Lab 03/14/17 1457 03/14/17 1830 03/15/17 0436 03/16/17 0520 03/17/17 0459  CALCIUM 7.2* 6.9* 7.0* 7.0* 7.6*  MG 2.0 1.9 2.0  --   --   PHOS 2.9 3.1 3.2  --   --     CBC Recent Labs  Lab 03/15/17 0436 03/16/17 0520 03/17/17 0459  WBC 16.7* 13.9* 15.2*  HGB 10.5* 10.0* 10.1*  HCT 31.7* 30.5* 31.4*  PLT 205 232 284    Coag's Recent Labs  Lab 03/11/17 1116  03/13/17 0359  03/15/17 0436 03/16/17 0520 03/17/17 0459  APTT  --    < > 43*   < > 64* 93* 86*  INR 1.34  --  1.22  --   --   --   --    < > = values in this interval not displayed.    Sepsis Markers Recent Labs  Lab 03/07/2017 2327 03/02/2017 2328 03/11/17 0113 03/11/17 0428 03/12/17 0533  LATICACIDVEN 4.2*  --  4.8* 5.0*  --   PROCALCITON  --  6.82  --  8.94 5.80    ABG Recent Labs  Lab 03/12/17 0553 03/12/17 2308 03/14/17 1050  PHART 7.34* 7.41 7.45  PCO2ART 43 40 44  PO2ART 76* 64* 52*    Liver Enzymes Recent Labs  Lab 03/15/17 0436 03/16/17 0520 03/17/17 0459  AST 169* 126* 90*  ALT 508* 300* 204*  ALKPHOS 135* 164* 176*  BILITOT 0.8 0.6 0.8  ALBUMIN 1.7* 1.7* 1.9*    Cardiac Enzymes Recent Labs  Lab 03/11/17 0113 03/11/17 0840 03/11/17 1241  TROPONINI 0.67* 1.46* 1.35*    Glucose Recent Labs  Lab 03/17/17 0740 03/17/17 0842 03/17/17 1010 03/17/17 1102 03/17/17 1210 03/17/17 1312  GLUCAP 139* 155* 170* 216* 221* 196*    CXR: Retrocardiac atelectasis versus infiltrate    ASSESSMENT / PLAN:  PULMONARY A: Acute respiratory failure -intubated for altered mental status P:   Cont vent support - settings reviewed and/or adjusted PSV as tolerated Cont vent bundle Daily SBT if/when meets criteria   CARDIOVASCULAR A:  Shock,  resolved Hypertension P:  MAP goal > 65 mmHg  RENAL A:   AKI, anuric Hypervolemia P:   Monitor BMET intermittently Monitor I/Os Correct electrolytes as indicated  HD today and tomorrow Discussed with nephrology  GASTROINTESTINAL A:   Obesity P:   SUP: IV famotidine Continue TFs at current rate  HEMATOLOGIC A:   Mild anemia without acute blood loss LUE DVT LUE arterial ischemia P:  Continue full dose heparin Monitor CBC intermittently Transfuse per usual guidelines   INFECTIOUS A:   MRSA right ankle wound infection Doubt pneumonia P:   Monitor temp, WBC count Micro and abx as above   ENDOCRINE A:   DKA resolved Severe insulin resistance Severe hyperglycemia P:   Continue insulin infusion  NEUROLOGIC A:   Severe anoxic encephalopathy Doubt seizure P:   RASS goal: 0, -1 Avoid sedatives if possible Neurology following Discontinue Keppra      CCM time: 35 mins The above time includes time spent in consultation with patient and/or family members and reviewing  care plan on multidisciplinary rounds  Billy Fischer, MD PCCM service Mobile [phone] Pager [phone] 03/17/2017, 1:33 PM

## 2017-03-17 NOTE — Progress Notes (Signed)
[facility] [place] [facility]   PATIENT NAME: Zachary Graves    MR#:  [id number]  DATE OF BIRTH:  Mar 23, 1980  SUBJECTIVE:   Patient is off sedation but still not following commands. MRI of the brain yesterday evidence of anoxic brain injury. Cr. Trending up and pt. Is volume overloaded and Nephro plans on starting HD today.   REVIEW OF SYSTEMS:    Review of Systems  Unable to perform ROS: Intubated    Nutrition: Tube feeds Tolerating Diet: Yes  DRUG ALLERGIES:   Allergies  Allergen Reactions  . Naproxen   . Sulfa Antibiotics Hives    VITALS:  Blood pressure 127/73, pulse (!) 110, temperature 99 F (37.2 C), temperature source Axillary, resp. rate (!) 0, height 5\' 9"  (1.753 m), weight (!) 164.5 kg (362 lb 10.5 oz), SpO2 96 %.  PHYSICAL EXAMINATION:   Physical Exam  GENERAL:  37 y.o.-year-old obese patient lying in bed encephalopathic & intubated.  EYES: Pupils equal, round, reactive to light. No scleral icterus. HEENT: Head atraumatic, normocephalic. ET and OG tubes in place.  NECK:  Supple, no jugular venous distention. No thyroid enlargement, no tenderness.  LUNGS: Normal breath sounds bilaterally, no wheezing, rales, rhonchi. No use of accessory muscles of respiration.  CARDIOVASCULAR: S1, S2 normal. No murmurs, rubs, or gallops.  ABDOMEN: Soft, nontender, nondistended. Bowel sounds present. No organomegaly or mass.  EXTREMITIES: No cyanosis, clubbing or edema b/l.   Left upper ext. Edema > right. Anasarcic.  NEUROLOGIC: Encephalopathic and intubated.  PSYCHIATRIC: Encephalopathic and Intubated.  SKIN: No obvious rash, lesion, or ulcer. Sign of chronic venous stasis and some dry excoriating areas in the left foot.   LABORATORY PANEL:   CBC Recent Labs  Lab 03/17/17 0459  WBC 15.2*  HGB 10.1*  HCT 31.4*  PLT 284    ------------------------------------------------------------------------------------------------------------------  Chemistries  Recent Labs  Lab 03/15/17 0436  03/17/17 0459  NA 137   < > 135  K 3.9   < > 4.4  CL 98*   < > 95*  CO2 30   < > 26  GLUCOSE 188*   < > 163*  BUN 36*   < > 83*  CREATININE 3.50*   < > 6.07*  CALCIUM 7.0*   < > 7.6*  MG 2.0  --   --   AST 169*   < > 90*  ALT 508*   < > 204*  ALKPHOS 135*   < > 176*  BILITOT 0.8   < > 0.8   < > = values in this interval not displayed.   ------------------------------------------------------------------------------------------------------------------  Cardiac Enzymes Recent Labs  Lab 03/11/17 1241  TROPONINI 1.35*   ------------------------------------------------------------------------------------------------------------------  RADIOLOGY:  Mr Brain Wo Contrast  Result Date: 03/15/2017 CLINICAL DATA:  37 y/o M; found on floor, severe DKA, acute kidney injury, possible sepsis, altered level of consciousness. EXAM: MRI HEAD WITHOUT CONTRAST TECHNIQUE: Multiplanar, multiecho pulse sequences of the brain and surrounding structures were obtained without intravenous contrast. COMPARISON:  03/12/2017 CT head FINDINGS: Brain: Bilateral globe pallidus increased T2 FLAIR signal and reduced diffusion. No hemorrhage. No additional focus of reduced diffusion to suggest acute or early subacute infarct. No mass effect, extra-axial collection, or effacement of basilar cisterns. Vascular: Normal flow voids. Skull and upper cervical spine: Normal marrow signal. Sinuses/Orbits: Paranasal sinus mucosal thickening and mastoid opacification likely due to intubation. Orbits are unremarkable. Other: None. IMPRESSION: Symmetric nonspecific globus pallidus lesions with reduced diffusion. Some differential considerations include  sequelae of hypoxic ischemic injury, carbon monoxide/cyanide toxicity, or toxic encephalopathy (notably heroin, MDMA).  Electronically Signed   By: Mitzi Hansen M.D.   On: 03/15/2017 22:07   Dg Chest Port 1 View  Result Date: 03/17/2017 CLINICAL DATA:  Hypoxia EXAM: PORTABLE CHEST 1 VIEW COMPARISON:  March 16, 2017 FINDINGS: Endotracheal tube tip is 3.5 cm above the carina. Central catheter tip is in the superior vena cava. Nasogastric tube tip and side port are below the diaphragm. No pneumothorax. There is atelectatic change and areas of consolidation in the left base. There is a small left pleural effusion. Lungs elsewhere are clear. Heart is upper normal in size with pulmonary vascularity within normal limits. No adenopathy. No bone lesions. An azygos lobe is noted on the right, an anatomic variant. IMPRESSION: Tube and catheter positions as described without pneumothorax. Airspace consolidation left base medially with left base atelectasis and small pleural effusion on the left as well. Right lung clear. Stable cardiac silhouette. Electronically Signed   By: Bretta Bang III M.D.   On: 03/17/2017 07:35   Dg Chest Port 1 View  Result Date: 03/16/2017 CLINICAL DATA:  Respiratory failure. EXAM: PORTABLE CHEST 1 VIEW COMPARISON:  03/04/2017. FINDINGS: Endotracheal tube, NG tube, right IJ line stable position. Cardiomegaly with normal pulmonary vascularity. Bibasilar atelectasis/infiltrate again noted. Similar findings on prior exam. Tiny left pleural effusion cannot be excluded on today's exam. No pneumothorax. IMPRESSION: 1.  Lines and tubes in stable position. 2. Low lung volumes with bibasilar atelectasis/infiltrates again noted. No significant interim change. Tiny left pleural effusion cannot be excluded on today's exam. Electronically Signed   By: Maisie Fus  Register   On: 03/16/2017 06:39     ASSESSMENT AND PLAN:   37 year old male with past medical history of morbid obesity, diabetes, medical noncompliance, diabetic neuropathy who presented to the hospital due to altered mental status and noted  to be in acute rhabdomyolysis and also acute diabetic ketoacidosis.  1. Altered mental status/encephalopathy- pt. Remains encephalopathic and does not respond to verbal stimuli. Remains Off sedation now.  -CT head on admission was positive for possible subacute CVA, but  MRI of the brain yesterday showing evidence of anoxic brain injury. Neurology discussed plan with patient's mother at bedside. She does not want to pursue tracheostomy/PEG tube. Continue supportive care with Keppra and if not improving consider withdrawal of care within the week.  2. Sepsis-no clear source identified. Off abx now.   -Cultures so far remain negative. Off vasopressors and stress dose steroids. BP improved.  - Follow hemodynamics.  Pt's right ankle wound cultures are + for MRSA but BC remain (-).   3. Acute diabetic ketoacidosis-secondary to patient's noncompliance. -Improved with IV fluids, insulin drip. Anion gap currently closed. Continue on insulin drip per ICU protocol.  4. Acute renal failure-secondary to sepsis. Patient remains oliguric, seen by nephrology and plan for HD today as Cr. Trending up and remains volume overloaded.   -- Renal dose meds, avoid nephrotoxins. Follow urine output.  5. Left upper extremity DVT-continue heparin drip.  6. Acute rhabdomyolysis- improved w/ IV fluids/sodium bicarb.  - CK's normalized.  7. Abnormal LFTs-secondary to shock liver from septic shock and multiorgan failure. - LFT's improving.   Patient is critically ill with multiorgan failure. Prognosis is poor given MRI Brain showing signs of Anoxic Brain Injury.  Mother is aware of poor prognosis and does not want to Pursue Trach/PEG.  Palliative Care consult to discuss goals of care.   All the  records are reviewed and case discussed with Care Management/Social Worker. Management plans discussed with the patient, family and they are in agreement.  CODE STATUS: Full  DVT Prophylaxis: Heparin gtt  TOTAL TIME TAKING  CARE OF THIS PATIENT: 30 minutes.   POSSIBLE D/C unclear, DEPENDING ON CLINICAL CONDITION.   Houston Siren M.D on 03/17/2017 at 3:48 PM  Between 7am to 6pm - Pager - [phone]  After 6pm go to [email or web address] - Social research officer, government  [employer] [facility]  Hospitalists  Office  [phone]  CC: Primary care physician; Patient, No Pcp Per

## 2017-03-17 NOTE — Progress Notes (Signed)
Dialysis treatment ended safely 

## 2017-03-17 NOTE — Progress Notes (Addendum)
Pharmacy Antibiotic Note  Zachary Graves is a 37 y.o. male admitted on 03/30/2017 with sepsis. Patient with MRSA and Group B Strep in right ankle culture.  Pharmacy has been consulted for vancomycin dosing. Patient now receiving daily dialysis.   Plan: Post dialysis level remains elevated at 31. Will obtain next level prior to initiation of dialysis on 1/17. Will redose vancomycin when level approaches 25.   Height: 5\' 9"  (175.3 cm) Weight: (!) 362 lb 10.5 oz (164.5 kg) IBW/kg (Calculated) : 70.7  Temp (24hrs), Avg:98.9 F (37.2 C), Min:98.2 F (36.8 C), Max:99.2 F (37.3 C)  Recent Labs  Lab 03/29/2017 2327 03/11/17 0113 03/11/17 0428  03/12/17 0321  03/13/17 0359  03/14/17 0514  03/14/17 1457 03/14/17 1830 03/15/17 0436 03/16/17 0520 03/16/17 2102 03/17/17 0459 03/17/17 0505 03/17/17 1729  WBC  --   --  14.4*  --   --    < > 18.0*  --  13.2*  --   --   --  16.7* 13.9*  --  15.2*  --   --   CREATININE  --  2.81* 2.69*   < > 2.59*   < > 2.56*   < >  --    < > 2.81* 2.97* 3.50* 4.73*  --  6.07*  --   --   LATICACIDVEN 4.2* 4.8* 5.0*  --   --   --   --   --   --   --   --   --   --   --   --   --   --   --   VANCOTROUGH  --   --   --   --  20  --   --   --   --   --   --   --   --   --  65*  --   --   --   VANCORANDOM  --   --   --   --   --   --   --   --   --   --   --   --   --   --   --   --  41 31   < > = values in this interval not displayed.    Estimated Creatinine Clearance: 25.7 mL/min (A) (by C-G formula based on SCr of 6.07 mg/dL (H)).    Allergies  Allergen Reactions  . Naproxen   . Sulfa Antibiotics Hives    Antimicrobials this admission: Zosyn 1/9 x 1   Cefepime 1/10 x1  Vancomycin 1/10 >> Ceftriaxone 1/10 >> 1/15 Acyclovir 1/10 >> 1/13   Microbiology results: 1/9 BCx: no growth x 5 days 1/9 UCx: no growth  1/10 Right Ankle Culture: MRSA/Group B Strep 1/10 MRSA PCR: positive  Thank you for allowing pharmacy to be a part of this patient's  care.  Simpson,Michael L 03/17/2017 7:04 PM

## 2017-03-17 NOTE — Progress Notes (Signed)
[place], [place] 03/17/17  Subjective:   Patient's mother at bedside.  UOP 124 cc Patient is critically ill and remains intubated   He is not on sedation but is now waking up or following commands No longer on pressors.  Getting iv heparin and iv insulin Tube feeds are continued 45 cc/hr FIO2 40%; PEEP 5 Continues to have large amount of edema  Objective:  Vital signs in last 24 hours:  Temp:  [98.2 F (36.8 C)-99.2 F (37.3 C)] 98.6 F (37 C) (01/16 0821) Pulse Rate:  [109-116] 110 (01/16 1000) Resp:  [0-29] 14 (01/16 1000) BP: (116-176)/(59-89) 154/72 (01/16 1000) SpO2:  [94 %-98 %] 96 % (01/16 1000) FiO2 (%):  [35 %-40 %] 40 % (01/16 0447) Weight:  [165.9 kg (365 lb 11.9 oz)] 165.9 kg (365 lb 11.9 oz) (01/16 0421)  Weight change: 1.3 kg (2 lb 13.9 oz) Filed Weights   03/15/17 0500 03/16/17 0500 03/17/17 0421  Weight: (!) 164.5 kg (362 lb 10.5 oz) (!) 164.6 kg (362 lb 14 oz) (!) 165.9 kg (365 lb 11.9 oz)    Intake/Output:    Intake/Output Summary (Last 24 hours) at 03/17/2017 1119 Last data filed at 03/17/2017 0700 Gross per 24 hour  Intake 1287.03 ml  Output 93 ml  Net 1194.03 ml     Physical Exam: General: Lying in bed. Intubated.   HEENT Normocephalic.   Neck Supple  Pulm/lungs Clear to auscultation   CVS/Heart Regular rhythm. Tachycardic. No murmurs   Abdomen:  Soft   Extremities: Left upper extremity noticably more swollen.Generalized edema.   Neurologic: Does not follow commands   Skin: brusing on left pinna and temporal area. Bluish discoloration, blisters and weeping on left forearm. Discoloration on right great toe. Dressing on right ankle   Access:  Temporary dialysis catheter   Foley in place    Basic Metabolic Panel:  Recent Labs  Lab 03/14/17 0634 03/14/17 1039 03/14/17 1457 03/14/17 1830 03/15/17 0436 03/16/17 0520 03/17/17 0459  NA 135 136 136 136 137 136 135  K 3.9 4.1 3.9 3.8 3.9 3.8 4.4  CL 99*  99* 98* 98* 98* 97* 95*  CO2 _0 GLUCOSE 209* 200* 212* 184* 188* 164* 163*  BUN 26* 25* 28* 29* 36* 58* 83*  CREATININE 2.66* 2.67* 2.81* 2.97* 3.50* 4.73* 6.07*  CALCIUM 7.1* 7.1* 7.2* 6.9* 7.0* 7.0* 7.6*  MG 1.9 1.8 2.0 1.9 2.0  --   --   PHOS 2.9 3.0 2.9 3.1 3.2  --   --      CBC: Recent Labs  Lab 03/12/2017 2012  03/12/17 0533 03/13/17 0359 03/14/17 0514 03/15/17 0436 03/16/17 0520 03/17/17 0459  WBC 13.4*   < > 13.1* 18.0* 13.2* 16.7* 13.9* 15.2*  NEUTROABS 11.5*  --  11.2*  --   --   --   --   --   HGB 15.5   < > 13.0 13.9 11.3* 10.5* 10.0* 10.1*  HCT 54.0*   < > 40.5 42.3 33.8* 31.7* 30.5* 31.4*  MCV 94.1   < > 83.9 83.2 82.5 82.6 83.3 84.0  PLT 379   < > 224 226 153 205 232 284   < > = values in this interval not displayed.     No results found for: HEPBSAG, HEPBSAB, HEPBIGM    Microbiology:  Recent Results (from the past 240 hour(s))  Urine culture     Status: None   Collection  Time: 03/29/2017  8:12 PM  Result Value Ref Range Status   Specimen Description   Final    URINE, RANDOM Performed at [facility], [address] [place], [place] [address]    Special Requests   Final    NONE Performed at [facility], [address], [place], [place] [address]    Culture   Final    NO GROWTH Performed at [facility], [place] [address], [place], [place] [address]    Report Status 03/12/2017 FINAL  Final  Blood Culture (routine x 2)     Status: None   Collection Time: 04/01/2017 11:28 PM  Result Value Ref Range Status   Specimen Description BLOOD LT [facility]  Final   Special Requests   Final    BOTTLES DRAWN AEROBIC AND ANAEROBIC Blood Culture adequate volume   Culture   Final    NO GROWTH 5 DAYS Performed at [facility], [address] [place], [place] [address]    Report Status 03/15/2017 FINAL  Final  Blood Culture (routine x 2)     Status: None   Collection Time: 03/13/2017 11:28 PM  Result Value Ref Range  Status   Specimen Description BLOOD RT [facility]  Final   Special Requests   Final    BOTTLES DRAWN AEROBIC AND ANAEROBIC Blood Culture adequate volume   Culture   Final    NO GROWTH 5 DAYS Performed at [facility], [address], [place], [place] [address]    Report Status 03/15/2017 FINAL  Final  MRSA PCR Screening     Status: Abnormal   Collection Time: 03/11/17 12:20 AM  Result Value Ref Range Status   MRSA by PCR POSITIVE (A) NEGATIVE Final    Comment:        The GeneXpert MRSA Assay (FDA approved for NASAL specimens only), is one component of a comprehensive MRSA colonization surveillance program. It is not intended to diagnose MRSA infection nor to guide or monitor treatment for MRSA infections. RESULT CALLED TO, READ BACK BY AND VERIFIED WITH: BARBARA THAO ON 03/11/17 AT 0310 JAG Performed at [facility], [address] [place], [place] [address]   Aerobic/Anaerobic Culture (surgical/deep wound)     Status: None   Collection Time: 03/11/17 12:53 AM  Result Value Ref Range Status   Specimen Description   Final    ANKLE RIGHT ANKLE Performed at [facility], [address], [place], [place] [address]    Special Requests   Final    NONE Performed at [facility], [address] [place],  [address]    Gram Stain   Final    RARE WBC PRESENT, PREDOMINANTLY PMN FEW GRAM POSITIVE RODS RARE GRAM POSITIVE COCCI IN PAIRS RARE YEAST    Culture   Final    ABUNDANT METHICILLIN RESISTANT STAPHYLOCOCCUS AUREUS MODERATE GROUP B STREP(S.AGALACTIAE)ISOLATED TESTING AGAINST S. AGALACTIAE NOT ROUTINELY PERFORMED DUE TO PREDICTABILITY OF AMP/PEN/VAN SUSCEPTIBILITY. FEW CANDIDA TROPICALIS NO ANAEROBES ISOLATED Performed at [facility], [place] [address] [place],  [address]    Report Status 03/15/2017 FINAL  Final   Organism ID, Bacteria METHICILLIN RESISTANT STAPHYLOCOCCUS AUREUS  Final      Susceptibility   Methicillin  resistant staphylococcus aureus - MIC*    CIPROFLOXACIN >=8 RESISTANT Resistant     ERYTHROMYCIN <=0.25 SENSITIVE Sensitive     GENTAMICIN <=0.5 SENSITIVE Sensitive     OXACILLIN RESISTANT Resistant     TETRACYCLINE <=1  SENSITIVE Sensitive     VANCOMYCIN <=0.5 SENSITIVE Sensitive     TRIMETH/SULFA <=10 SENSITIVE Sensitive     CLINDAMYCIN <=0.25 SENSITIVE Sensitive     RIFAMPIN <=0.5 SENSITIVE Sensitive     Inducible Clindamycin NEGATIVE Sensitive     * ABUNDANT METHICILLIN RESISTANT STAPHYLOCOCCUS AUREUS    Coagulation Studies: No results for input(s): LABPROT, INR in the last 72 hours.  Urinalysis: No results for input(s): COLORURINE, LABSPEC, PHURINE, GLUCOSEU, HGBUR, BILIRUBINUR, KETONESUR, PROTEINUR, UROBILINOGEN, NITRITE, LEUKOCYTESUR in the last 72 hours.  Invalid input(s): APPERANCEUR    Imaging: Mr Brain Wo Contrast  Result Date: 03/15/2017 CLINICAL DATA:  37 y/o M; found on floor, severe DKA, acute kidney injury, possible sepsis, altered level of consciousness. EXAM: MRI HEAD WITHOUT CONTRAST TECHNIQUE: Multiplanar, multiecho pulse sequences of the brain and surrounding structures were obtained without intravenous contrast. COMPARISON:  03/12/2017 CT head FINDINGS: Brain: Bilateral globe pallidus increased T2 FLAIR signal and reduced diffusion. No hemorrhage. No additional focus of reduced diffusion to suggest acute or early subacute infarct. No mass effect, extra-axial collection, or effacement of basilar cisterns. Vascular: Normal flow voids. Skull and upper cervical spine: Normal marrow signal. Sinuses/Orbits: Paranasal sinus mucosal thickening and mastoid opacification likely due to intubation. Orbits are unremarkable. Other: None. IMPRESSION: Symmetric nonspecific globus pallidus lesions with reduced diffusion. Some differential considerations include sequelae of hypoxic ischemic injury, carbon monoxide/cyanide toxicity, or toxic encephalopathy (notably heroin, MDMA).  Electronically Signed   By: Kristine Garbe M.D.   On: 03/15/2017 22:07   Dg Chest Port 1 View  Result Date: 03/17/2017 CLINICAL DATA:  Hypoxia EXAM: PORTABLE CHEST 1 VIEW COMPARISON:  March 16, 2017 FINDINGS: Endotracheal tube tip is 3.5 cm above the carina. Central catheter tip is in the superior vena cava. Nasogastric tube tip and side port are below the diaphragm. No pneumothorax. There is atelectatic change and areas of consolidation in the left base. There is a small left pleural effusion. Lungs elsewhere are clear. Heart is upper normal in size with pulmonary vascularity within normal limits. No adenopathy. No bone lesions. An azygos lobe is noted on the right, an anatomic variant. IMPRESSION: Tube and catheter positions as described without pneumothorax. Airspace consolidation left base medially with left base atelectasis and small pleural effusion on the left as well. Right lung clear. Stable cardiac silhouette. Electronically Signed   By: Lowella Grip III M.D.   On: 03/17/2017 07:35   Dg Chest Port 1 View  Result Date: 03/16/2017 CLINICAL DATA:  Respiratory failure. EXAM: PORTABLE CHEST 1 VIEW COMPARISON:  03/04/2017. FINDINGS: Endotracheal tube, NG tube, right IJ line stable position. Cardiomegaly with normal pulmonary vascularity. Bibasilar atelectasis/infiltrate again noted. Similar findings on prior exam. Tiny left pleural effusion cannot be excluded on today's exam. No pneumothorax. IMPRESSION: 1.  Lines and tubes in stable position. 2. Low lung volumes with bibasilar atelectasis/infiltrates again noted. No significant interim change. Tiny left pleural effusion cannot be excluded on today's exam. Electronically Signed   By: [place]   On: 03/16/2017 06:39     Medications:   . sodium chloride    . albumin human    . famotidine (PEPCID) IV Stopped (03/17/17 0027)  . heparin 2,400 Units/hr (03/17/17 0700)  . insulin (NOVOLIN-R) infusion 11.2 Units/hr (03/17/17  1112)  . levETIRAcetam Stopped (03/17/17 0230)   . chlorhexidine gluconate (MEDLINE KIT)  15 mL Mouth Rinse BID  . feeding supplement (PRO-STAT SUGAR FREE 64)  60 mL Per Tube BID  . feeding supplement (  VITAL HIGH PROTEIN)  1,000 mL Per Tube Q24H  . mouth rinse  15 mL Mouth Rinse 10 times per day  . multivitamin  15 mL Per Tube Daily   sodium chloride, acetaminophen, fentaNYL (SUBLIMAZE) injection, ondansetron (ZOFRAN) IV  Assessment/ Plan:  37 y.o.caucasian  male with insulin dependent diabetes mellitus, hypertension, morbid obesity, diabetic peripheral neuropathy, Legg-calve-Perthes Disease, osteoarthritis, who was admitted to [facility] on1/9/2019for diabetic ketoacidosis.   1. Acute renal failure. Poor urine output.  K is normal; bicarb is normal  No response to IV Lasix.  Urine output did not improve. Daily dialysis for the next few days to clear uremia and see if it helps patient's mental status  2. DKA. Poorly controlled insulin dependent diabetes.  Continue management by ICU team.   3. Rhabdomyolysis. Required HD.  CK level has continued to decrease since admission. Last check on 1/13.   4. Generalized edema.  Volume removal with HD as tolerated with iv albumin support.   5. Acute respiratory failure  requiring ventilator support. Fio2 40%    LOS: [place] 1/16/201911:19 AM  [facility] [place], [place]

## 2017-03-17 NOTE — Progress Notes (Signed)
Pre dialysis assessment 

## 2017-03-18 ENCOUNTER — Inpatient Hospital Stay: Payer: Medicaid Other

## 2017-03-18 LAB — GLUCOSE, CAPILLARY
GLUCOSE-CAPILLARY: 165 mg/dL — AB (ref 65–99)
GLUCOSE-CAPILLARY: 156 mg/dL — AB (ref 65–99)
GLUCOSE-CAPILLARY: 153 mg/dL — AB (ref 65–99)
GLUCOSE-CAPILLARY: 167 mg/dL — AB (ref 65–99)
GLUCOSE-CAPILLARY: 184 mg/dL — AB (ref 65–99)
GLUCOSE-CAPILLARY: 151 mg/dL — AB (ref 65–99)
GLUCOSE-CAPILLARY: 149 mg/dL — AB (ref 65–99)
GLUCOSE-CAPILLARY: 150 mg/dL — AB (ref 65–99)
GLUCOSE-CAPILLARY: 169 mg/dL — AB (ref 65–99)
GLUCOSE-CAPILLARY: 184 mg/dL — AB (ref 65–99)
Glucose-Capillary: 129 mg/dL — ABNORMAL HIGH (ref 65–99)
Glucose-Capillary: 137 mg/dL — ABNORMAL HIGH (ref 65–99)
Glucose-Capillary: 138 mg/dL — ABNORMAL HIGH (ref 65–99)
Glucose-Capillary: 138 mg/dL — ABNORMAL HIGH (ref 65–99)
Glucose-Capillary: 139 mg/dL — ABNORMAL HIGH (ref 65–99)
Glucose-Capillary: 144 mg/dL — ABNORMAL HIGH (ref 65–99)
Glucose-Capillary: 154 mg/dL — ABNORMAL HIGH (ref 65–99)
Glucose-Capillary: 168 mg/dL — ABNORMAL HIGH (ref 65–99)
Glucose-Capillary: 176 mg/dL — ABNORMAL HIGH (ref 65–99)
Glucose-Capillary: 179 mg/dL — ABNORMAL HIGH (ref 65–99)
Glucose-Capillary: 180 mg/dL — ABNORMAL HIGH (ref 65–99)
Glucose-Capillary: 200 mg/dL — ABNORMAL HIGH (ref 65–99)

## 2017-03-18 LAB — CBC
HCT: 28.6 % — ABNORMAL LOW (ref 40.0–52.0)
Hemoglobin: 9.4 g/dL — ABNORMAL LOW (ref 13.0–18.0)
MCH: 27.5 pg (ref 26.0–34.0)
MCHC: 32.8 g/dL (ref 32.0–36.0)
MCV: 83.7 fL (ref 80.0–100.0)
Platelets: 283 10*3/uL (ref 150–440)
RBC: 3.41 MIL/uL — AB (ref 4.40–5.90)
RDW: 15.8 % — AB (ref 11.5–14.5)
WBC: 15.8 10*3/uL — AB (ref 3.8–10.6)

## 2017-03-18 LAB — COMPREHENSIVE METABOLIC PANEL
ALT: 121 U/L — AB (ref 17–63)
AST: 55 U/L — AB (ref 15–41)
Albumin: 2.2 g/dL — ABNORMAL LOW (ref 3.5–5.0)
Alkaline Phosphatase: 140 U/L — ABNORMAL HIGH (ref 38–126)
Anion gap: 13 (ref 5–15)
BUN: 91 mg/dL — ABNORMAL HIGH (ref 6–20)
CHLORIDE: 96 mmol/L — AB (ref 101–111)
CO2: 26 mmol/L (ref 22–32)
CREATININE: 6.11 mg/dL — AB (ref 0.61–1.24)
Calcium: 7.8 mg/dL — ABNORMAL LOW (ref 8.9–10.3)
GFR, EST AFRICAN AMERICAN: 12 mL/min — AB (ref >60)
GFR, EST NON AFRICAN AMERICAN: 11 mL/min — AB (ref >60)
Glucose, Bld: 150 mg/dL — ABNORMAL HIGH (ref 65–99)
POTASSIUM: 4.9 mmol/L (ref 3.5–5.1)
SODIUM: 135 mmol/L (ref 135–145)
Total Bilirubin: 0.9 mg/dL (ref 0.3–1.2)
Total Protein: 6 g/dL — ABNORMAL LOW (ref 6.5–8.1)

## 2017-03-18 LAB — BLOOD GAS, ARTERIAL
ACID-BASE EXCESS: 2.9 mmol/L — AB (ref 0.0–2.0)
Bicarbonate: 27.1 mmol/L (ref 20.0–28.0)
FIO2: 100
LHR: 18 {breaths}/min
MECHVT: 650 mL
O2 SAT: 95.2 %
PCO2 ART: 39 mmHg (ref 32.0–48.0)
PEEP: 5 cmH2O
Patient temperature: 37
pH, Arterial: 7.45 (ref 7.350–7.450)
pO2, Arterial: 73 mmHg — ABNORMAL LOW (ref 83.0–108.0)

## 2017-03-18 LAB — TRIGLYCERIDES: TRIGLYCERIDES: 247 mg/dL — AB (ref <150)

## 2017-03-18 LAB — HEPATITIS B SURFACE ANTIBODY, QUANTITATIVE: Hepatitis B-Post: <3.1 m[IU]/mL — ABNORMAL LOW (ref >9.9)

## 2017-03-18 LAB — HEPATITIS B SURFACE ANTIGEN: HEP B S AG: NEGATIVE

## 2017-03-18 LAB — VANCOMYCIN, RANDOM: Vancomycin Rm: 30

## 2017-03-18 LAB — HEPARIN LEVEL (UNFRACTIONATED): Heparin Unfractionated: 0.56 IU/mL (ref 0.30–0.70)

## 2017-03-18 LAB — APTT: APTT: 75 s — AB (ref 24–36)

## 2017-03-18 MED ORDER — VECURONIUM BROMIDE 10 MG IV SOLR
INTRAVENOUS | Status: AC
  Administered 2017-03-18: 10 mg via INTRAVENOUS
  Filled 2017-03-18: qty 10

## 2017-03-18 MED ORDER — STERILE WATER FOR INJECTION IJ SOLN
INTRAMUSCULAR | Status: AC
  Administered 2017-03-18: 10 mL
  Filled 2017-03-18: qty 10

## 2017-03-18 MED ORDER — PHENYLEPHRINE HCL 10 MG/ML IJ SOLN
0.0000 ug/min | Freq: Once | INTRAVENOUS | Status: DC
  Filled 2017-03-18: qty 4

## 2017-03-18 MED ORDER — FAMOTIDINE 20 MG PO TABS
20.0000 mg | ORAL_TABLET | Freq: Every day | ORAL | Status: DC
  Administered 2017-03-18 – 2017-03-19 (×2): 20 mg
  Filled 2017-03-18 (×2): qty 1

## 2017-03-18 MED ORDER — SODIUM CHLORIDE 0.9 % IV SOLN
0.0000 ug/min | INTRAVENOUS | Status: DC
  Administered 2017-03-18: 50 ug/min via INTRAVENOUS
  Filled 2017-03-18: qty 40

## 2017-03-18 MED ORDER — FENTANYL CITRATE (PF) 100 MCG/2ML IJ SOLN
25.0000 ug | INTRAMUSCULAR | Status: DC | PRN
  Administered 2017-03-18: 50 ug via INTRAVENOUS

## 2017-03-18 MED ORDER — VECURONIUM BROMIDE 10 MG IV SOLR
10.0000 mg | Freq: Once | INTRAVENOUS | Status: AC
  Administered 2017-03-18: 10 mg via INTRAVENOUS

## 2017-03-18 MED ORDER — FENTANYL CITRATE (PF) 100 MCG/2ML IJ SOLN
INTRAMUSCULAR | Status: AC
  Administered 2017-03-18: 50 ug via INTRAVENOUS
  Filled 2017-03-18: qty 2

## 2017-03-18 MED ORDER — HYDRALAZINE HCL 20 MG/ML IJ SOLN
10.0000 mg | INTRAMUSCULAR | Status: DC | PRN
  Administered 2017-03-18: 20 mg via INTRAVENOUS
  Filled 2017-03-18: qty 2

## 2017-03-18 NOTE — Progress Notes (Signed)
Subjective: Period of agitation through out last night at this AM but not following commands.    Objective: Current vital signs: BP (!) 177/75   Pulse (!) 112   Temp 99 F (37.2 C) (Axillary)   Resp (!) 35   Ht _0  (1.753 m)   Wt (!) 362 lb 10.5 oz (164.5 kg)   SpO2 94%   BMI 53.56 kg/m  Vital signs in last 24 hours: Temp:  [98.2 F (36.8 C)-99.2 F (37.3 C)] 99 F (37.2 C) (01/17 0811) Pulse Rate:  [96-115] 112 (01/17 1100) Resp:  [0-35] 35 (01/17 1100) BP: (114-180)/(58-80) 177/75 (01/17 1100) SpO2:  [92 %-98 %] 94 % (01/17 1116) FiO2 (%):  [40 %] 40 % (01/17 1116) Weight:  [362 lb 10.5 oz (164.5 kg)] 362 lb 10.5 oz (164.5 kg) (01/16 1349)  Intake/Output from previous day: 01/16 0701 - 01/17 0700 In: 1059.7 [I.V.:741.4; NG/GT:268.3; IV Piggyback:50] Out: 0630 [Urine:100] Intake/Output this shift: No intake/output data recorded. Nutritional status: No diet orders on file  Neurologic Exam: Mental Status: Withdraws from painful stimuli  Cranial Nerves: II: patient does not respond confrontation bilaterally, pupils right 3 mm, left 3 mm,and reactive bilaterally III,IV,VI: doll's response absent bilaterally. V,VII: corneal reflex reduced bilaterally  VIII: patient does not respond to verbal stimuli IX,X: gag reflex reduced, XI: trapezius strength unable to test bilaterally XII: tongue strength unable to test Motor: Extremities flaccid throughout.  No spontaneous movement noted.  No purposeful movements noted. Sensory: Does not respond to noxious stimuli in any extremity.   Lab Results: Basic Metabolic Panel: Recent Labs  Lab 03/14/17 0634 03/14/17 1039 03/14/17 1457 03/14/17 1830 03/15/17 0436 03/16/17 0520 03/17/17 0459 03/18/17 0702  NA 135 136 136 136 137 136 135 135  K 3.9 4.1 3.9 3.8 3.9 3.8 4.4 4.9  CL 99* 99* 98* 98* 98* 97* 95* 96*  CO2 _1 GLUCOSE 209* 200* 212* 184* 188* 164* 163* 150*  BUN 26* 25* 28* 29* 36* 58* 83*  91*  CREATININE 2.66* 2.67* 2.81* 2.97* 3.50* 4.73* 6.07* 6.11*  CALCIUM 7.1* 7.1* 7.2* 6.9* 7.0* 7.0* 7.6* 7.8*  MG 1.9 1.8 2.0 1.9 2.0  --   --   --   PHOS 2.9 3.0 2.9 3.1 3.2  --   --   --     Liver Function Tests: Recent Labs  Lab 03/14/17 1039  03/14/17 1830 03/15/17 0436 03/16/17 0520 03/17/17 0459 03/18/17 0702  AST 282*  --   --  169* 126* 90* 55*  ALT 772*  --   --  508* 300* 204* 121*  ALKPHOS 132*  --   --  135* 164* 176* 140*  BILITOT 0.8  --   --  0.8 0.6 0.8 0.9  PROT 5.1*  --   --  5.0* 5.0* 5.7* 6.0*  ALBUMIN 1.7*  1.8*   < > 1.7* 1.7* 1.7* 1.9* 2.2*   < > = values in this interval not displayed.   Recent Labs  Lab 03/11/17 1543  LIPASE 1,539*  AMYLASE 789*   No results for input(s): AMMONIA in the last 168 hours.  CBC: Recent Labs  Lab 03/12/17 0533  03/14/17 0514 03/15/17 0436 03/16/17 0520 03/17/17 0459 03/18/17 0702  WBC 13.1*   < > 13.2* 16.7* 13.9* 15.2* 15.8*  NEUTROABS 11.2*  --   --   --   --   --   --   HGB 13.0   < >  11.3* 10.5* 10.0* 10.1* 9.4*  HCT 40.5   < > 33.8* 31.7* 30.5* 31.4* 28.6*  MCV 83.9   < > 82.5 82.6 83.3 84.0 83.7  PLT 224   < > 153 205 232 284 283   < > = values in this interval not displayed.    Cardiac Enzymes: Recent Labs  Lab 03/11/17 1241  03/12/17 1524 03/13/17 0359 03/13/17 1503 03/14/17 0304 03/14/17 1039  CKTOTAL  --    < > 35,820* 19,835* 11,678* 7,822* 3,652*  TROPONINI 1.35*  --   --   --   --   --   --    < > = values in this interval not displayed.    Lipid Panel: Recent Labs  Lab 03/18/17 0014  TRIG 247*    CBG: Recent Labs  Lab 03/18/17 0655 03/18/17 0759 03/18/17 0915 03/18/17 1017 03/18/17 1124  GLUCAP 137* 153* 138* 167* 49*    Microbiology: Results for orders placed or performed during the hospital encounter of 03/23/2017  Urine culture     Status: None   Collection Time: 03/03/2017  8:12 PM  Result Value Ref Range Status   Specimen Description   Final    URINE,  RANDOM Performed at [facility], [address] [place], [place] [address]    Special Requests   Final    NONE Performed at [facility], [address] [place], [place] [address]    Culture   Final    NO GROWTH Performed at [facility], [place] [address] [place], [place] [address]    Report Status 03/12/2017 FINAL  Final  Blood Culture (routine x 2)     Status: None   Collection Time: 03/15/2017 11:28 PM  Result Value Ref Range Status   Specimen Description BLOOD LT [facility]  Final   Special Requests   Final    BOTTLES DRAWN AEROBIC AND ANAEROBIC Blood Culture adequate volume   Culture   Final    NO GROWTH 5 DAYS Performed at [facility], [address] [place], [place] [address]    Report Status 03/15/2017 FINAL  Final  Blood Culture (routine x 2)     Status: None   Collection Time: 03/18/2017 11:28 PM  Result Value Ref Range Status   Specimen Description BLOOD RT [facility]  Final   Special Requests   Final    BOTTLES DRAWN AEROBIC AND ANAEROBIC Blood Culture adequate volume   Culture   Final    NO GROWTH 5 DAYS Performed at [facility], [address] [place], [place] [address]    Report Status 03/15/2017 FINAL  Final  MRSA PCR Screening     Status: Abnormal   Collection Time: 03/11/17 12:20 AM  Result Value Ref Range Status   MRSA by PCR POSITIVE (A) NEGATIVE Final    Comment:        The GeneXpert MRSA Assay (FDA approved for NASAL specimens only), is one component of a comprehensive MRSA colonization surveillance program. It is not intended to diagnose MRSA infection nor to guide or monitor treatment for MRSA infections. RESULT CALLED TO, READ BACK BY AND VERIFIED WITH: BARBARA THAO ON 03/11/17 AT 0310 JAG Performed at [facility], [address] [place], [place] [address]   Aerobic/Anaerobic Culture (surgical/deep wound)     Status: None   Collection Time: 03/11/17 12:53 AM  Result Value Ref Range Status    Specimen Description   Final    ANKLE  RIGHT ANKLE Performed at [facility], [address] [place], [place] [address]    Special Requests   Final    NONE Performed at [facility], [address] [place], [place] [address]    Gram Stain   Final    RARE WBC PRESENT, PREDOMINANTLY PMN FEW GRAM POSITIVE RODS RARE GRAM POSITIVE COCCI IN PAIRS RARE YEAST    Culture   Final    ABUNDANT METHICILLIN RESISTANT STAPHYLOCOCCUS AUREUS MODERATE GROUP B STREP(S.AGALACTIAE)ISOLATED TESTING AGAINST S. AGALACTIAE NOT ROUTINELY PERFORMED DUE TO PREDICTABILITY OF AMP/PEN/VAN SUSCEPTIBILITY. FEW CANDIDA TROPICALIS NO ANAEROBES ISOLATED Performed at [facility], [place] [address], [place], [place] [address]    Report Status 03/15/2017 FINAL  Final   Organism ID, Bacteria METHICILLIN RESISTANT STAPHYLOCOCCUS AUREUS  Final      Susceptibility   Methicillin resistant staphylococcus aureus - MIC*    CIPROFLOXACIN >=8 RESISTANT Resistant     ERYTHROMYCIN <=0.25 SENSITIVE Sensitive     GENTAMICIN <=0.5 SENSITIVE Sensitive     OXACILLIN RESISTANT Resistant     TETRACYCLINE <=1 SENSITIVE Sensitive     VANCOMYCIN <=0.5 SENSITIVE Sensitive     TRIMETH/SULFA <=10 SENSITIVE Sensitive     CLINDAMYCIN <=0.25 SENSITIVE Sensitive     RIFAMPIN <=0.5 SENSITIVE Sensitive     Inducible Clindamycin NEGATIVE Sensitive     * ABUNDANT METHICILLIN RESISTANT STAPHYLOCOCCUS AUREUS    Coagulation Studies: No results for input(s): LABPROT, INR in the last 72 hours.  Imaging: Dg Chest Port 1 View  Result Date: 03/17/2017 CLINICAL DATA:  Hypoxia EXAM: PORTABLE CHEST 1 VIEW COMPARISON:  March 16, 2017 FINDINGS: Endotracheal tube tip is 3.5 cm above the carina. Central catheter tip is in the superior vena cava. Nasogastric tube tip and side port are below the diaphragm. No pneumothorax. There is atelectatic change and areas of consolidation in the left base. There is a small left pleural  effusion. Lungs elsewhere are clear. Heart is upper normal in size with pulmonary vascularity within normal limits. No adenopathy. No bone lesions. An azygos lobe is noted on the right, an anatomic variant. IMPRESSION: Tube and catheter positions as described without pneumothorax. Airspace consolidation left base medially with left base atelectasis and small pleural effusion on the left as well. Right lung clear. Stable cardiac silhouette. Electronically Signed   By: Lowella Grip III M.D.   On: 03/17/2017 07:35    Medications:  I have reviewed the patient's current medications. Scheduled: . chlorhexidine gluconate (MEDLINE KIT)  15 mL Mouth Rinse BID  . feeding supplement (PRO-STAT SUGAR FREE 64)  60 mL Per Tube BID  . feeding supplement (VITAL HIGH PROTEIN)  1,000 mL Per Tube Q24H  . lactulose  20 g Oral Q8H  . mouth rinse  15 mL Mouth Rinse 10 times per day  . multivitamin  15 mL Per Tube Daily    Assessment/Plan: Not waking up and not following commands Agitation and needed sedation/fentanyl D/w family and still do no thing good news for meaningful recovery as not following commands.  MRI consistent with subcortical anoxia specifically bilateral globus pallidus which is relay center They would not want trach/peg Likely withdrawal of care next few days.   Barney Gertsch    LOS: 8 days    03/18/2017  11:38 AM

## 2017-03-18 NOTE — Progress Notes (Signed)
HD tx end  

## 2017-03-18 NOTE — Progress Notes (Signed)
[place], [place] 03/18/17  Subjective:   Patient's mother at bedside.  UOP 100 cc Patient is critically ill and remains intubated   He is not on sedation but is not waking up or following commands No longer on pressors.  Getting iv heparin and iv insulin Tube feeds are continued 20 cc/hr FIO2 40%; PEEP 5 Continues to have large amount of edema Underwent HD yesterday. 1.5 L removed No improvement in mental status so far  Objective:  Vital signs in last 24 hours:  Temp:  [98.2 F (36.8 C)-99.2 F (37.3 C)] 99 F (37.2 C) (01/17 0811) Pulse Rate:  [96-115] 106 (01/17 0811) Resp:  [0-33] 28 (01/17 0811) BP: (114-180)/(58-81) 168/80 (01/17 0800) SpO2:  [92 %-98 %] 96 % (01/17 0811) FiO2 (%):  [40 %] 40 % (01/17 0752) Weight:  [164.5 kg (362 lb 10.5 oz)] 164.5 kg (362 lb 10.5 oz) (01/16 1349)  Weight change: -1.4 kg (-1.4 oz) Filed Weights   03/16/17 0500 03/17/17 0421 03/17/17 1349  Weight: (!) 164.6 kg (362 lb 14 oz) (!) 165.9 kg (365 lb 11.9 oz) (!) 164.5 kg (362 lb 10.5 oz)    Intake/Output:    Intake/Output Summary (Last 24 hours) at 03/18/2017 0922 Last data filed at 03/18/2017 0700 Gross per 24 hour  Intake 1059.7 ml  Output 1810 ml  Net -750.3 ml     Physical Exam: General: Lying in bed. Intubated.   HEENT Normocephalic.   Neck Supple  Pulm/lungs Clear to auscultation   CVS/Heart Regular rhythm. Tachycardic. No murmurs   Abdomen:  Soft   Extremities: Left upper extremity noticably more swollen.Generalized edema.   Neurologic: Does not follow commands    Access:  rt IJ Temporary dialysis catheter    Foley in place       Basic Metabolic Panel:  Recent Labs  Lab 03/14/17 0634 03/14/17 1039 03/14/17 1457 03/14/17 1830 03/15/17 0436 03/16/17 0520 03/17/17 0459 03/18/17 0702  NA 135 136 136 136 137 136 135 135  K 3.9 4.1 3.9 3.8 3.9 3.8 4.4 4.9  CL 99* 99* 98* 98* 98* 97* 95* 96*  CO2 27 28 29 29 30 30 26 26    GLUCOSE 209* 200* 212* 184* 188* 164* 163* 150*  BUN 26* 25* 28* 29* 36* 58* 83* 91*  CREATININE 2.66* 2.67* 2.81* 2.97* 3.50* 4.73* 6.07* 6.11*  CALCIUM 7.1* 7.1* 7.2* 6.9* 7.0* 7.0* 7.6* 7.8*  MG 1.9 1.8 2.0 1.9 2.0  --   --   --   PHOS 2.9 3.0 2.9 3.1 3.2  --   --   --      CBC: Recent Labs  Lab 03/12/17 0533  03/14/17 0514 03/15/17 0436 03/16/17 0520 03/17/17 0459 03/18/17 0702  WBC 13.1*   < > 13.2* 16.7* 13.9* 15.2* 15.8*  NEUTROABS 11.2*  --   --   --   --   --   --   HGB 13.0   < > 11.3* 10.5* 10.0* 10.1* 9.4*  HCT 40.5   < > 33.8* 31.7* 30.5* 31.4* 28.6*  MCV 83.9   < > 82.5 82.6 83.3 84.0 83.7  PLT 224   < > 153 205 232 284 283   < > = values in this interval not displayed.      Lab Results  Component Value Date   HEPBSAG Negative 03/17/2017      Microbiology:  Recent Results (from the past 240 hour(s))  Urine culture  Status: None   Collection Time: 03/30/2017  8:12 PM  Result Value Ref Range Status   Specimen Description   Final    URINE, RANDOM Performed at [facility], [address] [place], [place] [address]    Special Requests   Final    NONE Performed at [facility], [address] [place], [place] [address]    Culture   Final    NO GROWTH Performed at [facility], [place] [address], [place], [place] [address]    Report Status 03/12/2017 FINAL  Final  Blood Culture (routine x 2)     Status: None   Collection Time: 03/05/2017 11:28 PM  Result Value Ref Range Status   Specimen Description BLOOD LT [facility]  Final   Special Requests   Final    BOTTLES DRAWN AEROBIC AND ANAEROBIC Blood Culture adequate volume   Culture   Final    NO GROWTH 5 DAYS Performed at [facility], [address] [place], [place] [address]    Report Status 03/15/2017 FINAL  Final  Blood Culture (routine x 2)     Status: None   Collection Time: 03/17/2017 11:28 PM  Result Value Ref Range Status   Specimen Description BLOOD RT [facility]   Final   Special Requests   Final    BOTTLES DRAWN AEROBIC AND ANAEROBIC Blood Culture adequate volume   Culture   Final    NO GROWTH 5 DAYS Performed at [facility], [address], [place], [place] [address]    Report Status 03/15/2017 FINAL  Final  MRSA PCR Screening     Status: Abnormal   Collection Time: 03/11/17 12:20 AM  Result Value Ref Range Status   MRSA by PCR POSITIVE (A) NEGATIVE Final    Comment:        The GeneXpert MRSA Assay (FDA approved for NASAL specimens only), is one component of a comprehensive MRSA colonization surveillance program. It is not intended to diagnose MRSA infection nor to guide or monitor treatment for MRSA infections. RESULT CALLED TO, READ BACK BY AND VERIFIED WITH: BARBARA THAO ON 03/11/17 AT 0310 JAG Performed at [facility], [address] [place], [place] [address]   Aerobic/Anaerobic Culture (surgical/deep wound)     Status: None   Collection Time: 03/11/17 12:53 AM  Result Value Ref Range Status   Specimen Description   Final    ANKLE RIGHT ANKLE Performed at [facility], [address], [place], [place] [address]    Special Requests   Final    NONE Performed at [facility], [address] [place], [place] [address]    Gram Stain   Final    RARE WBC PRESENT, PREDOMINANTLY PMN FEW GRAM POSITIVE RODS RARE GRAM POSITIVE COCCI IN PAIRS RARE YEAST    Culture   Final    ABUNDANT METHICILLIN RESISTANT STAPHYLOCOCCUS AUREUS MODERATE GROUP B STREP(S.AGALACTIAE)ISOLATED TESTING AGAINST S. AGALACTIAE NOT ROUTINELY PERFORMED DUE TO PREDICTABILITY OF AMP/PEN/VAN SUSCEPTIBILITY. FEW CANDIDA TROPICALIS NO ANAEROBES ISOLATED Performed at [facility], [place] [address], [place], [place] [address]    Report Status 03/15/2017 FINAL  Final   Organism ID, Bacteria METHICILLIN RESISTANT STAPHYLOCOCCUS AUREUS  Final      Susceptibility   Methicillin resistant staphylococcus aureus - MIC*     CIPROFLOXACIN >=8 RESISTANT Resistant     ERYTHROMYCIN <=0.25 SENSITIVE Sensitive     GENTAMICIN <=0.5 SENSITIVE Sensitive     OXACILLIN RESISTANT Resistant  TETRACYCLINE <=1 SENSITIVE Sensitive     VANCOMYCIN <=0.5 SENSITIVE Sensitive     TRIMETH/SULFA <=10 SENSITIVE Sensitive     CLINDAMYCIN <=0.25 SENSITIVE Sensitive     RIFAMPIN <=0.5 SENSITIVE Sensitive     Inducible Clindamycin NEGATIVE Sensitive     * ABUNDANT METHICILLIN RESISTANT STAPHYLOCOCCUS AUREUS    Coagulation Studies: No results for input(s): LABPROT, INR in the last 72 hours.  Urinalysis: No results for input(s): COLORURINE, LABSPEC, PHURINE, GLUCOSEU, HGBUR, BILIRUBINUR, KETONESUR, PROTEINUR, UROBILINOGEN, NITRITE, LEUKOCYTESUR in the last 72 hours.  Invalid input(s): APPERANCEUR    Imaging: Dg Chest Port 1 View  Result Date: 03/17/2017 CLINICAL DATA:  Hypoxia EXAM: PORTABLE CHEST 1 VIEW COMPARISON:  March 16, 2017 FINDINGS: Endotracheal tube tip is 3.5 cm above the carina. Central catheter tip is in the superior vena cava. Nasogastric tube tip and side port are below the diaphragm. No pneumothorax. There is atelectatic change and areas of consolidation in the left base. There is a small left pleural effusion. Lungs elsewhere are clear. Heart is upper normal in size with pulmonary vascularity within normal limits. No adenopathy. No bone lesions. An azygos lobe is noted on the right, an anatomic variant. IMPRESSION: Tube and catheter positions as described without pneumothorax. Airspace consolidation left base medially with left base atelectasis and small pleural effusion on the left as well. Right lung clear. Stable cardiac silhouette. Electronically Signed   By: Lowella Grip III M.D.   On: 03/17/2017 07:35     Medications:   . sodium chloride    . famotidine (PEPCID) IV Stopped (03/18/17 0039)  . heparin 2,400 Units/hr (03/18/17 0700)  . insulin (NOVOLIN-R) infusion 4.1 Units/hr (03/18/17 0916)  .  propofol (DIPRIVAN) infusion 20 mcg/kg/min (03/18/17 0600)   . chlorhexidine gluconate (MEDLINE KIT)  15 mL Mouth Rinse BID  . feeding supplement (PRO-STAT SUGAR FREE 64)  60 mL Per Tube BID  . feeding supplement (VITAL HIGH PROTEIN)  1,000 mL Per Tube Q24H  . lactulose  20 g Oral Q8H  . mouth rinse  15 mL Mouth Rinse 10 times per day  . multivitamin  15 mL Per Tube Daily   sodium chloride, acetaminophen, hydrALAZINE, ondansetron (ZOFRAN) IV  Assessment/ Plan:  37 y.o.caucasian  male with insulin dependent diabetes mellitus, hypertension, morbid obesity, diabetic peripheral neuropathy, Legg-calve-Perthes Disease, osteoarthritis, who was admitted to [facility] on1/9/2019for diabetic ketoacidosis.   1. Acute renal failure. Poor urine output.  K is normal; bicarb is normal  No response to IV Lasix.  Urine output did not improve. Daily dialysis for the next few days to clear uremia and see if it helps patient's mental status  2. DKA. Poorly controlled insulin dependent diabetes.  Continue management by ICU team.   3. Rhabdomyolysis. Required HD.  CK level has continued to decrease since admission.   4. Generalized edema.  Volume removal with HD as tolerated with iv albumin support.   5. Acute respiratory failure  requiring ventilator support. Fio2 40%  6. Rt ankle culture MRSA Abx as per ICU team    LOS: [place] 1/17/20199:22 [place], [place]

## 2017-03-18 NOTE — Progress Notes (Signed)
PHARMACIST - PHYSICIAN COMMUNICATION  CONCERNING: IV to Oral Route Change Policy  RECOMMENDATION: This patient is receiving famotidine by the intravenous route.  Based on criteria approved by the Pharmacy and Therapeutics Committee, the intravenous medication(s) is/are being converted to the equivalent oral dose form(s).   DESCRIPTION: These criteria include:  The patient is eating (either orally or via tube) and/or has been taking other orally administered medications for a least 24 hours  The patient has no evidence of active gastrointestinal bleeding or impaired GI absorption (gastrectomy, short bowel, patient on TNA or NPO).  If you have questions about this conversion, please contact the Pharmacy Department  []   [phone] )  [facility] []   [phone] )  [facility] []   [phone] )  [facility] []   [phone] )  [facility] []   [phone] )  [facility]   Simpson,Michael L, [facility] 03/18/2017 11:44 AM

## 2017-03-18 NOTE — Progress Notes (Signed)
RT, RN, and MD at bedside.

## 2017-03-18 NOTE — Progress Notes (Signed)
HD tx start 

## 2017-03-18 NOTE — Progress Notes (Signed)
Nutrition Follow-up  DOCUMENTATION CODES:   Morbid obesity  INTERVENTION:  Plan is to continue Vital High Protein at 20 mL/hr + Pro-Stat 60 mL BID. Provides 880 kcal, 102 grams of protein, 403 mL H2O daily. With current propofol rate provides 1400 kcal.  Continue liquid MVI daily per tube.  Will continue to monitor outcome of discussions regarding goals of care.  NUTRITION DIAGNOSIS:   Inadequate oral intake related to inability to eat as evidenced by NPO status.  Ongoing.  GOAL:   Provide needs based on ASPEN/SCCM guidelines  Not met. However, plan is for possible withdrawal of care in the next few days.  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 Patient remained unresponsive in ER requiring mechanical intubation for airway protection. Patient found to have severe DKA, septic shock, acute liver failure with hepatic coma, and anuric renal failure requiring CRRT.  Patient intubated and sedated. Per chart and discussion on rounds patient is not waking up or following commands. MRI consistent with subcortical anoxia. Family would not want trach/PEG. Plan is for likely withdrawal of care in the next few days. Patient will undergo HD again today.  Access: OGT placed 1/11; tip in stomach per chest x-ray 1/13; 70 cm at corner of mouth  TF: patient tolerating Vital High Protein at 20 mL/hr; per chart he received 460 mL VHP past 24 hrs + 4 Pro-Stat; total of 860 kcal (54% minimum estimated kcal needs) and 100 grams of protein (55% estimated protein needs)  Patient is currently intubated on ventilator support MV: 10.8 L/min Temp (24hrs), Avg:99 F (37.2 C), Min:98.2 F  (36.8 C), Max:99.2 F (37.3 C)  Propofol: 19.7 ml/hr (520 kcal daily)  Medications reviewed and include: famotidine, lactulose 20 grams Q8hrs, liquid MVI daily per tube, heparin gtt, regular insulin gtt, propofol gtt.  Labs reviewed: CBG 137-200, Chloride 96, BUN 91.  I/O: 100 mL UOP yesterday; 1710 mL removed from HD yesterday  Weight trend: 170.6 kg 1/17; +24.5 kg from admission  Discussed with RN and on rounds. Plan discussed is to continue VHP at 20 mL/hr. No meaningful chance for recovery. Plan is to try to transition off insulin gtt and onto sliding scale coverage.  Diet Order:  No diet orders on file  EDUCATION NEEDS:   No education needs have been identified at this time  Skin:  Skin Assessment: Skin Integrity Issues: Skin Integrity Issues:: Stage III, Other (Comment) Stage III: right ankle Other: serous blister left arm; cracking to bilateral feet; ecchymosis to left head, arm, and leg  Last BM:  03/18/2017 - large type 6  Height:   Ht Readings from Last 1 Encounters:  03/11/17 _0  (1.753 m)    Weight:   Wt Readings from Last 1 Encounters:  03/18/17 (!) 376 lb 1.7 oz (170.6 kg)    Ideal Body Weight:  72.7 kg  BMI:  Body mass index is 55.54 kg/m.  Estimated Nutritional Needs:   Kcal:  8786-7672 (11-14 kcal/kg)  Protein:  >/= 182 grams (>/= 2.5 grams/kg IBW)  Fluid:  2.2-2.5 L/day (30-35 mL/kg IBW)  Willey Blade, MS, RD, LDN Office: [phone] Pager: [phone] After Hours/Weekend Pager: [phone]

## 2017-03-18 NOTE — Progress Notes (Signed)
PULMONARY / CRITICAL CARE MEDICINE   Name: Zachary Graves MRN: [id number] DOB: 10/12/1980    ADMISSION DATE:  03/17/2017  PT PROFILE:   42 M with PMH of DM, obesity, chronic R foot ulceration presented to ED 01/09 with unresponsiveness requiring intubation and found to be in severe DKA with MOSF  MAJOR EVENTS/TEST RESULTS: 01/09 admitted as above. Intubated in ED. DKA protocol, vasopressors, HCO3 infusion, empiric antibiotics. Tox screnn + for opiates 01/10 LUE [place]: positive for occlusive DVT extending from the peripheral aspect of the left subclavian vein to involve the left axillary and one of the paired left brachial veins 01/10 CT head: Normal noncontrast CT of the brain. Moderate left temporal subcutaneous hematoma 01/10 Neurology consultation: "Due to the fact that CNS infection remains in the differential broad-spectrum antibiotic coverage is indicated". Meningitis coverage initiated 01/10 EEG: Diffuse slowing, no epileptiform activity 01/10 Vasc surg consultation: "Left upper extremity with signs of ischemia.  This is likely multifactorial on 3 different pressor agents with continued hypotension.  At this point, the patient is hemodynamically unstable and on maximal support and would not be a candidate for any sort of surgical or interventional therapy.  If he can tolerate anticoagulation, this can certainly be considered" 01/10 Heparin infusion initiated 01/10 Nephrology consultation: anuria noted. CRRT initiaited 01/11 repeat CT head: Focal areas of low attenuation in the internal capsule medial to the lentiform nuclei bilaterally, new compared to the prior CT and may represent ischemia. Further evaluation with MRI recommended 01/13 CRRT discontinued with plan for intermittent HD thereafter 01/14 MRI brain: Symmetric nonspecific globus pallidus lesions with reduced diffusion. Some differential considerations include sequelae of hypoxic ischemic injury, carbon monoxide/cyanide toxicity, or  toxic encephalopathy  01/15 Comatose off of all sedation. Remains on insulin and heparin infusions.  01/15 Neurology conveyed poor prognosis to pt's mother. She indicated that she would not wish to pursue trach tube or G tube 01/15 EEG:  abnormal electroencephalogram secondary to a discontinuous background activity that is slow and poorly organized.  There vertex sharp activity noted may represent epileptiform transients versus poorly formed sleep transients.  Clinical correlation recommended 01/16 Remains comatose. Plan HD X 2 days to see if any improvement in cognition. If not, will discuss terminal extubation 01/18. Mother apprised 01/17: Episodes of hypoxia and tachypnea overnight and this morning; placed back on [facility]  INDWELLING DEVICES:: R IJ CVL 01/09 >> 01/10 ETT 01/09 >>  L femoral CVL 01/10 >>  R IJ HD cath 01/10 >>   MICRO DATA: R ankle wound 01/10 >> abundant MRSA  Urine 01/09 >> NEG Blood 01/09 >> NEG  ANTIMICROBIALS:   Pip-tazo 01/09 >> 01/10 Ceftriaxone 01/10 >> 01/15 Vanc 01/09 >>   SUBJECTIVE:  Remains comatose but had episodes of hypoxia, and tachypnea with respiratory rate in the 40s and SPO2 in the low 80s. BVM ventilation for a couple of minutes with improvement in SPO2. Now on 100% FiO2 and propofol for vent dyssynchrony. Due for HD today. Remains volume overloaded. Total UOP 100cc.   VITAL SIGNS: BP (!) 112/52   Pulse (!) 108   Temp 99 F (37.2 C) (Axillary)   Resp (!) 29   Ht 5\' 9"  (1.753 m)   Wt (!) 170.6 kg (376 lb 1.7 oz)   SpO2 92%   BMI 55.54 kg/m   HEMODYNAMICS:    VENTILATOR SETTINGS: Vent Mode: PSV FiO2 (%):  [40 %] 40 % Set Rate:  [18 bmp] 18 bmp PEEP:  [5 cmH20] 5 cmH20  Pressure Support:  [8 cmH20-14 cmH20] 14 cmH20 Plateau Pressure:  [11 cmH20] 11 cmH20  INTAKE / OUTPUT: I/O last 3 completed shifts: In: 1766.7 [I.V.:1053.4; NG/GT:508.3; IV Piggyback:205] Out: 1860 [Urine:150; Other:1710]  PHYSICAL EXAMINATION: General:  Comatose, obese, anasarca Neuro: Pupils react symmetrically, no withdrawal, no spontaneous movement HEENT: Ischemic/gangrenous changes on left ear Cardiovascular: Distant HS, no M noted, regular Lungs: Clear anteriorly without adventitious sounds Abdomen: Markedly obese, diminished BS, no palpable masses Extremities: Severe symmetric lower extremity edema.  Ischemic changes on forearm and multiple digits of left hand with blistering  LABS:  BMET Recent Labs  Lab 03/16/17 0520 03/17/17 0459 03/18/17 0702  NA 136 135 135  K 3.8 4.4 4.9  CL 97* 95* 96*  CO2 30 26 26   BUN 58* 83* 91*  CREATININE 4.73* 6.07* 6.11*  GLUCOSE 164* 163* 150*    Electrolytes Recent Labs  Lab 03/14/17 1457 03/14/17 1830 03/15/17 0436 03/16/17 0520 03/17/17 0459 03/18/17 0702  CALCIUM 7.2* 6.9* 7.0* 7.0* 7.6* 7.8*  MG 2.0 1.9 2.0  --   --   --   PHOS 2.9 3.1 3.2  --   --   --     CBC Recent Labs  Lab 03/16/17 0520 03/17/17 0459 03/18/17 0702  WBC 13.9* 15.2* 15.8*  HGB 10.0* 10.1* 9.4*  HCT 30.5* 31.4* 28.6*  PLT 232 284 283    Coag's Recent Labs  Lab 03/13/17 0359  03/16/17 0520 03/17/17 0459 03/18/17 0702  APTT 43*   < > 93* 86* 75*  INR 1.22  --   --   --   --    < > = values in this interval not displayed.    Sepsis Markers Recent Labs  Lab 03/12/17 0533  PROCALCITON 5.80    ABG Recent Labs  Lab 03/12/17 2308 03/14/17 1050 03/18/17 1216  PHART 7.41 7.45 7.45  PCO2ART 40 44 39  PO2ART 64* 52* 73*    Liver Enzymes Recent Labs  Lab 03/16/17 0520 03/17/17 0459 03/18/17 0702  AST 126* 90* 55*  ALT 300* 204* 121*  ALKPHOS 164* 176* 140*  BILITOT 0.6 0.8 0.9  ALBUMIN 1.7* 1.9* 2.2*    Cardiac Enzymes No results for input(s): TROPONINI, PROBNP in the last 168 hours.  Glucose Recent Labs  Lab 03/18/17 0608 03/18/17 0655 03/18/17 0759 03/18/17 0915 03/18/17 1017 03/18/17 1124  GLUCAP 144* 137* 153* 138* 167* 184*    CXR: Retrocardiac  atelectasis versus infiltrate Dg Chest Port 1 View  Result Date: 03/18/2017 CLINICAL DATA:  Acute respiratory failure.  Intubated patient. EXAM: PORTABLE CHEST 1 VIEW COMPARISON:  Single-view of the chest 03/17/2017 and 03/16/2017. FINDINGS: Support tubes and lines are unchanged. Left basilar airspace opacity persists. Right lung is clear. Heart size is upper normal. No pneumothorax. IMPRESSION: Support tubes and lines are unchanged and project in good position. No change in left basilar opacity.  No new abnormality. Electronically Signed   By: Drusilla Kanner M.D.   On: 03/18/2017 12:51    ASSESSMENT / PLAN:  PULMONARY A: Acute respiratory failure -intubated for altered mental status; now with recurrent episodes of tachypnea and hypoxia Left pleural effusion P:   Cont vent support - settings reviewed and/or adjusted PSV as tolerated Cont vent bundle Daily SBT if/when meets criteria  STAT ABG and CXR reviewed  Nebulized bronchodilators  CARDIOVASCULAR A:  Shock, resolved Hypertension-BP stable; off pressors P:  MAP goal > 65 mmHg Prn pressors to keep MAP>65  RENAL A:  AKI, anuric Hypervolemia P:   Monitor BMET intermittently Monitor I/Os Correct electrolytes as indicated  HD yesterday with 1.5 L out; HD in progress Plan per nephrology   GASTROINTESTINAL A:   Obesity P:   SUP: IV famotidine Continue TFs at current rate  HEMATOLOGIC A:   Mild anemia without acute blood loss LUE DVT LUE arterial ischemia P:  Continue full dose heparin Monitor CBC intermittently Transfuse per usual guidelines   INFECTIOUS A:   MRSA right ankle wound infection Doubt pneumonia P:   Monitor temp, WBC count Micro and abx as above   ENDOCRINE A:   DKA resolved Severe insulin resistance Severe hyperglycemia P:   Continue insulin infusion with blood glucose monitoring  NEUROLOGIC A:   Severe anoxic encephalopathy Doubt seizure-no abnormal arm and leg movements; last  EEG abnormal but not definitive for seizure activity P:   RASS goal: 0, -1 Avoid sedatives if possible Neurology following  Family update: Per Dr Sung Amabile conversation with the family, he is not to be resuscitated in case of cardiac arrest. Plan is to terminal wean if no improvement by 1/18  Magdalene S. [facility] ANP-BC Pulmonary and Critical Care Medicine [facility] Pager [phone] or [phone]  03/18/2017, 12:46 PM     PCCM ATTENDING ATTESTATION:  I have evaluated patient with the APP [place], reviewed database in its entirety and discussed care plan in detail. In addition, this patient was discussed on multidisciplinary rounds.   Repeat HD today. If not significantly improved by 01/18, will likely undertake terminal extubation  Billy Fischer, MD PCCM service Mobile [phone] Pager [phone] 03/18/2017 2:41 PM

## 2017-03-18 NOTE — Progress Notes (Signed)
ANTICOAGULATION CONSULT NOTE - Follow Up Consult  Pharmacy Consult for heparin dosing. Indication: upper extremity DVT.  Assessment: 37 yo M admitted for unresponsiveness secondary to DKA and intubated. Patient was also found to have upper extremity DVT.   Goal of Therapy:  Heparin level 0.3-0.7 units/ml. Monitor platelets by anticoagulation protocol: Yes.   Plan:  Continue heparin imfusion rate at 2400 units/hr. Will check heparin level and CBC with morning labs.  1/16 0500 HL 0.61 1/17 0700 HL 0.56   Allergies  Allergen Reactions  . Naproxen   . Sulfa Antibiotics Hives    Patient Measurements: Height: 5\' 9"  (175.3 cm) Weight: (!) 362 lb 10.5 oz (164.5 kg) IBW/kg (Calculated) : 70.7 Heparin Dosing Weight: 111kg  Vital Signs: Temp: 99 F (37.2 C) (01/17 0811) Temp Source: Axillary (01/17 0811) BP: 168/80 (01/17 0800) Pulse Rate: 106 (01/17 0811)  Labs: Recent Labs    03/16/17 0520 03/16/17 1137 03/17/17 0459 03/18/17 0702  HGB 10.0*  --  10.1* 9.4*  HCT 30.5*  --  31.4* 28.6*  PLT 232  --  284 283  APTT 93*  --  86* 75*  HEPARINUNFRC 0.68 0.60 0.61 0.56  CREATININE 4.73*  --  6.07* 6.11*    Estimated Creatinine Clearance: 25.6 mL/min (A) (by C-G formula based on SCr of 6.11 mg/dL (H)).   Medications:  Medications Prior to Admission  Medication Sig Dispense Refill Last Dose  . doxycycline (VIBRAMYCIN) 100 MG capsule Take 100 mg by mouth 2 (two) times daily. For 14 days   Unknown at Unknown  . DULoxetine (CYMBALTA) 30 MG capsule Take 30 mg by mouth daily.  2 Unknown at Unknown  . gabapentin (NEURONTIN) 800 MG tablet Take 800 mg by mouth 4 (four) times daily.  5 Unknown at Unknown  . ibuprofen (ADVIL,MOTRIN) 800 MG tablet Take 800 mg by mouth every 8 (eight) hours as needed. For pain   prn at prn  . ondansetron (ZOFRAN-ODT) 4 MG disintegrating tablet Take 1 tablet (4 mg total) every 8 (eight) hours as needed by mouth for nausea or vomiting. 8 tablet 0 prn  at prn  . Oxycodone HCl 20 MG TABS Take 1 tablet by mouth every 6 (six) hours as needed.  0 prn at prn  . OXYCONTIN 60 MG 12 hr tablet Take 60 mg by mouth every 12 (twelve) hours.  0 Unknown at Unknown  . ranitidine (ZANTAC) 75 MG tablet Take 75 mg by mouth daily as needed for heartburn.   prn at prn  . tiZANidine (ZANAFLEX) 4 MG tablet Take 4 mg by mouth 4 (four) times daily.   Unknown at Unknown  . ATENOLOL PO Take by mouth.   Not Taking at Unknown time  . Phenylephrine-DM-GG-APAP (TYLENOL COLD/FLU SEVERE) 5-10-200-325 MG TABS Take 2 tablets by mouth every 4 (four) hours as needed. For fever and nausea   Not Taking at Unknown time   Pharmacy will continue to monitor per consult.  [place] Dru Primeau 03/18/2017,10:48 AM

## 2017-03-18 NOTE — Progress Notes (Signed)
Pre HD assessment  

## 2017-03-18 NOTE — Progress Notes (Signed)
[facility]   PATIENT NAME: Zachary Graves    MR#:  [id number]  DATE OF BIRTH:  [birth year]  SUBJECTIVE:   [place] O2 requirements increased 100% sat afternoon. Chest x-ray shows no acute changes. Still not following any commands and remains intubated. MRI of the brain done a few days ago shows evidence of anoxic brain injury. If not improving in the next 24-48 hours family is considering a terminal wean off ventilator.  REVIEW OF SYSTEMS:    Review of Systems  Unable to perform ROS: Intubated    Nutrition: Tube feeds Tolerating Diet: Yes  DRUG ALLERGIES:   Allergies  Allergen Reactions  . Naproxen   . Sulfa Antibiotics Hives    VITALS:  Blood pressure 130/65, pulse 94, temperature 99 F (37.2 C), temperature source Axillary, resp. rate (!) 24, height 5\' 9"  (1.753 m), weight (!) 170.6 kg (376 lb 1.7 oz), SpO2 98 %.  PHYSICAL EXAMINATION:   Physical Exam  GENERAL:  37 y.o.-year-old obese patient lying in bed encephalopathic & intubated.  EYES: Pupils equal, round, reactive to light. No scleral icterus. HEENT: Head atraumatic, normocephalic. ET and OG tubes in place.  NECK:  Supple, no jugular venous distention. No thyroid enlargement, no tenderness.  LUNGS: Normal breath sounds bilaterally, no wheezing, rales, rhonchi. No use of accessory muscles of respiration.  CARDIOVASCULAR: S1, S2 normal. No murmurs, rubs, or gallops.  ABDOMEN: Soft, nontender, nondistended. Bowel sounds present. No organomegaly or mass.  EXTREMITIES: No cyanosis, clubbing or edema b/l.   Left upper ext. Edema > right. Anasarcic.  NEUROLOGIC: Encephalopathic and intubated.  PSYCHIATRIC: Encephalopathic and Intubated.  SKIN: No obvious rash, lesion, or ulcer. Sign of chronic venous stasis and some dry excoriating areas in the left foot.   LABORATORY PANEL:   CBC Recent Labs  Lab 03/18/17 0702  WBC 15.8*  HGB 9.4*  HCT 28.6*  PLT 283    ------------------------------------------------------------------------------------------------------------------  Chemistries  Recent Labs  Lab 03/15/17 0436  03/18/17 0702  NA 137   < > 135  K 3.9   < > 4.9  CL 98*   < > 96*  CO2 30   < > 26  GLUCOSE 188*   < > 150*  BUN 36*   < > 91*  CREATININE 3.50*   < > 6.11*  CALCIUM 7.0*   < > 7.8*  MG 2.0  --   --   AST 169*   < > 55*  ALT 508*   < > 121*  ALKPHOS 135*   < > 140*  BILITOT 0.8   < > 0.9   < > = values in this interval not displayed.   ------------------------------------------------------------------------------------------------------------------  Cardiac Enzymes No results for input(s): TROPONINI in the last 168 hours. ------------------------------------------------------------------------------------------------------------------  RADIOLOGY:  Dg Chest Port 1 View  Result Date: 03/18/2017 CLINICAL DATA:  Acute respiratory failure.  Intubated patient. EXAM: PORTABLE CHEST 1 VIEW COMPARISON:  Single-view of the chest 03/17/2017 and 03/16/2017. FINDINGS: Support tubes and lines are unchanged. Left basilar airspace opacity persists. Right lung is clear. Heart size is upper normal. No pneumothorax. IMPRESSION: Support tubes and lines are unchanged and project in good position. No change in left basilar opacity.  No new abnormality. Electronically Signed   By: Drusilla Kanner M.D.   On: 03/18/2017 12:51   Dg Chest Port 1 View  Result Date: 03/17/2017 CLINICAL DATA:  Hypoxia EXAM: PORTABLE CHEST 1 VIEW COMPARISON:  March 16, 2017 FINDINGS: Endotracheal  tube tip is 3.5 cm above the carina. Central catheter tip is in the superior vena cava. Nasogastric tube tip and side port are below the diaphragm. No pneumothorax. There is atelectatic change and areas of consolidation in the left base. There is a small left pleural effusion. Lungs elsewhere are clear. Heart is upper normal in size with pulmonary vascularity within  normal limits. No adenopathy. No bone lesions. An azygos lobe is noted on the right, an anatomic variant. IMPRESSION: Tube and catheter positions as described without pneumothorax. Airspace consolidation left base medially with left base atelectasis and small pleural effusion on the left as well. Right lung clear. Stable cardiac silhouette. Electronically Signed   By: Bretta Bang III M.D.   On: 03/17/2017 07:35     ASSESSMENT AND PLAN:   37 year old male with past medical history of morbid obesity, diabetes, medical noncompliance, diabetic neuropathy who presented to the hospital due to altered mental status and noted to be in acute rhabdomyolysis and also acute diabetic ketoacidosis.  1. Altered mental status/encephalopathy- pt. Remains encephalopathic and does not respond to verbal stimuli and remains off sedation. -CT head on admission was positive for possible subacute CVA, but  MRI of the brain 03/16/17 showing evidence of anoxic brain injury. Neurology discussed plan with patient's mother at bedside. She does not want to pursue tracheostomy/PEG tube. Continue supportive care with Keppra and if not improving consider withdrawal of care in next 24-48 hrs.  2. Sepsis-no clear source identified. Off abx now.   -Cultures so far remain negative. Off vasopressors and stress dose steroids. BP improved.  - Follow hemodynamics.  Pt's right ankle wound cultures are + for MRSA but BC remain (-).   3. Acute diabetic ketoacidosis-secondary to patient's noncompliance. -Improved with IV fluids, insulin drip. Anion gap currently closed. Continue on insulin drip per ICU protocol.  4. Acute renal failure-secondary to sepsis. Patient remains oliguric, seen by nephrology and HD yesterday and plan for HD again today.  -mental status not improving despite getting HD.   5. Left upper extremity DVT-continue heparin drip.  6. Acute rhabdomyolysis- improved w/ IV fluids/sodium bicarb.  - CK's  normalized.  7. Abnormal LFTs-secondary to shock liver from septic shock and multiorgan failure. - LFT's improving.   Patient is critically ill with multiorgan failure. Prognosis is poor given MRI Brain showing signs of Anoxic Brain Injury.  Mother is aware of poor prognosis and does not want to Pursue Trach/PEG.  Possible Terminal wean off vent tomorrow.   All the records are reviewed and case discussed with Care Management/Social Worker. Management plans discussed with the patient, family and they are in agreement.  CODE STATUS: Full  DVT Prophylaxis: Heparin gtt  TOTAL TIME TAKING CARE OF THIS PATIENT: 30 minutes.   POSSIBLE D/C unclear, DEPENDING ON CLINICAL CONDITION.   Houston Siren M.D on 03/18/2017 at 3:49 PM  Between 7am to 6pm - Pager - [phone]  After 6pm go to [email or web address] - Social research officer, government  [facility]  Office  [phone]  CC: Primary care physician; Patient, No Pcp Per

## 2017-03-18 NOTE — Progress Notes (Signed)
Post HD assessment  

## 2017-03-18 NOTE — Progress Notes (Signed)
Pharmacy Antibiotic Note  Zachary Graves is a 37 y.o. male admitted on 04/01/2017 with sepsis. Patient with MRSA and Group B Strep in right ankle culture.  Pharmacy has been consulted for vancomycin dosing. Patient is now receiving daily dialysis.   Plan: Post dialysis level remains elevated at 30. Will obtain next level prior to initiation of dialysis on 1/18. Will redose vancomycin when level approaches 25.   Pharmacy will continue to monitor per consult.  Height: 5\' 9"  (175.3 cm) Weight: (!) 376 lb 1.7 oz (170.6 kg) IBW/kg (Calculated) : 70.7  Temp (24hrs), Avg:99 F (37.2 C), Min:98.2 F (36.8 C), Max:99.2 F (37.3 C)  Recent Labs  Lab 03/12/17 0321  03/14/17 0514  03/14/17 1830 03/15/17 0436 03/16/17 0520 03/16/17 2102 03/17/17 0459  03/17/17 1729 03/18/17 0702 03/18/17 1132  WBC  --    < > 13.2*  --   --  16.7* 13.9*  --  15.2*  --   --  15.8*  --   CREATININE 2.59*   < >  --    < > 2.97* 3.50* 4.73*  --  6.07*  --   --  6.11*  --   VANCOTROUGH 20  --   --   --   --   --   --  63*  --   --   --   --   --   VANCORANDOM  --   --   --   --   --   --   --   --   --    < > 31  --  30   < > = values in this interval not displayed.    Estimated Creatinine Clearance: 26.2 mL/min (A) (by C-G formula based on SCr of 6.11 mg/dL (H)).    Allergies  Allergen Reactions  . Naproxen   . Sulfa Antibiotics Hives    Antimicrobials this admission: Zosyn 1/9 x 1   Cefepime 1/10 x1  Vancomycin 1/10 >> Ceftriaxone 1/10 >> 1/15 Acyclovir 1/10 >> 1/13   Microbiology results: 1/9 BCx: no growth x 5 days 1/9 UCx: no growth  1/10 Right Ankle Culture: MRSA/Group B Strep 1/10 MRSA PCR: positive  Thank you for allowing pharmacy to be a part of this patient's care.  [place] Jalei Shibley 03/18/2017 2:32 PM

## 2017-03-18 NOTE — Progress Notes (Signed)
Patient started becoming tachypneic- orders for PRN fentanyl ordered which pt did not response from.  Pt placed back on PRVC-Propofol initiated then patient suddenly became hypoxic in the low 80's- pt Fi02 brought up to 100% from 40%- the patient sating best at 90%. Still had increased work of breathing- vecuronium ordered and STAT chest xray with ABG.  Started neo-synephrine to help maintain blood pressures during dialysis- pt now DNR.

## 2017-03-19 LAB — GLUCOSE, CAPILLARY
GLUCOSE-CAPILLARY: 180 mg/dL — AB (ref 65–99)
GLUCOSE-CAPILLARY: 153 mg/dL — AB (ref 65–99)
GLUCOSE-CAPILLARY: 153 mg/dL — AB (ref 65–99)
Glucose-Capillary: 182 mg/dL — ABNORMAL HIGH (ref 65–99)
Glucose-Capillary: 162 mg/dL — ABNORMAL HIGH (ref 65–99)
Glucose-Capillary: 182 mg/dL — ABNORMAL HIGH (ref 65–99)
Glucose-Capillary: 148 mg/dL — ABNORMAL HIGH (ref 65–99)
Glucose-Capillary: 139 mg/dL — ABNORMAL HIGH (ref 65–99)
Glucose-Capillary: 118 mg/dL — ABNORMAL HIGH (ref 65–99)
Glucose-Capillary: 146 mg/dL — ABNORMAL HIGH (ref 65–99)

## 2017-03-19 LAB — APTT: aPTT: 37 seconds — ABNORMAL HIGH (ref 24–36)

## 2017-03-19 LAB — HEPARIN LEVEL (UNFRACTIONATED): HEPARIN UNFRACTIONATED: 0.36 [IU]/mL (ref 0.30–0.70)

## 2017-03-19 NOTE — Progress Notes (Signed)
Nurse suggested chaplain check in with patient family. Chaplain introduced self and offered to visit.  Family expressed preference for a check in later.

## 2017-03-19 NOTE — Care Management (Addendum)
RNCM received a call from RN stating that patient's mother is requesting assistnace with burial of patient when he passes. I have reached out to [place] [employer] [phone] Alvie [place]. They would like for family to try to come up with cost of burial or at least have some money to go towards burial. They only assist with cremation. Medicaid of [place] does not assist with burial either. DSS may get involved if family cannot pay after around 10 days. Dennard Nip said that patient's mom could have his number if she has questions however I tried to call patient's mom and her voice mail was full. RN updated. I understand that [employer] may have something called [employer] also for cremation. I spoke with Sharyl Nimrod patient's sister and shared Eugene's contact to see if he could advise her on process.  I explained to Sharyl Nimrod that she would need to try to obtain the money elsewhere/other family members first and would be allowed up to six days per Dennard Nip.  I understand that the body would remain in hospital morgue until then.

## 2017-03-19 NOTE — Progress Notes (Signed)
   03/19/17 1445  Clinical Encounter Type  Visited With Patient and family together  Visit Type Follow-up;Spiritual support  Spiritual Encounters  Spiritual Needs Prayer;Grief support  Stress Factors  Family Stress Factors Loss  Chaplain offered emotional support and ministry of presence to patient parents.  Conversation around prayers for patient and how parents choose to shape prayer at this time. Also discussed self-care.  Chaplain met with sister in waiting room. Sister inquiring about cremation resources. Chaplain and patient sister discussed resources for supporting and educating sister's children. Chaplain spoke with care manager regarding sister's request.

## 2017-03-19 NOTE — Progress Notes (Signed)
PULMONARY / CRITICAL CARE MEDICINE   Name: Zachary Graves MRN: [id number] DOB: Jul 22, 1980    ADMISSION DATE:  01-Apr-2017  PT PROFILE:   59 M with PMH of DM, obesity, chronic R foot ulceration presented to ED 01/09 with unresponsiveness requiring intubation and found to be in severe DKA with MOSF  MAJOR EVENTS/TEST RESULTS: 01/09 admitted as above. Intubated in ED. DKA protocol, vasopressors, HCO3 infusion, empiric antibiotics. Tox screnn + for opiates 01/10 LUE [place]: positive for occlusive DVT extending from the peripheral aspect of the left subclavian vein to involve the left axillary and one of the paired left brachial veins 01/10 CT head: Normal noncontrast CT of the brain. Moderate left temporal subcutaneous hematoma 01/10 Neurology consultation: "Due to the fact that CNS infection remains in the differential broad-spectrum antibiotic coverage is indicated". Meningitis coverage initiated 01/10 EEG: Diffuse slowing, no epileptiform activity 01/10 Vasc surg consultation: "Left upper extremity with signs of ischemia.  This is likely multifactorial on 3 different pressor agents with continued hypotension.  At this point, the patient is hemodynamically unstable and on maximal support and would not be a candidate for any sort of surgical or interventional therapy.  If he can tolerate anticoagulation, this can certainly be considered" 01/10 Heparin infusion initiated 01/10 Nephrology consultation: anuria noted. CRRT initiaited 01/11 repeat CT head: Focal areas of low attenuation in the internal capsule medial to the lentiform nuclei bilaterally, new compared to the prior CT and may represent ischemia. Further evaluation with MRI recommended 01/13 CRRT discontinued with plan for intermittent HD thereafter 01/14 MRI brain: Symmetric nonspecific globus pallidus lesions with reduced diffusion. Some differential considerations include sequelae of hypoxic ischemic injury, carbon monoxide/cyanide toxicity, or  toxic encephalopathy  01/15 Comatose off of all sedation. Remains on insulin and heparin infusions.  01/15 Neurology conveyed poor prognosis to pt's mother. She indicated that she would not wish to pursue trach tube or G tube 01/15 EEG:  abnormal electroencephalogram secondary to a discontinuous background activity that is slow and poorly organized.  There vertex sharp activity noted may represent epileptiform transients versus poorly formed sleep transients.  Clinical correlation recommended 01/16 Remains comatose. Plan HD X 2 days to see if any improvement in cognition. If not, will discuss terminal extubation 01/18. Mother apprised 01/17: Episodes of hypoxia and tachypnea overnight and this morning; placed back on [facility] 01/18 Worsening gas exchange. No improvement in neurological status. Family prepared for terminal extubation. Other family members to arrive. Plan terminal extubation 01/19  INDWELLING DEVICES:: R IJ CVL 01/09 >> 01/10 ETT 01/09 >>  L femoral CVL 01/10 >>  R IJ HD cath 01/10 >>   MICRO DATA: R ankle wound 01/10 >> abundant MRSA  Urine 01/09 >> NEG Blood 01/09 >> NEG  ANTIMICROBIALS:   Pip-tazo 01/09 >> 01/10 Ceftriaxone 01/10 >> 01/15 Vanc 01/09 >> 01/18  SUBJECTIVE:  Remains comatose off of all sedatives  VITAL SIGNS: BP (!) 146/75   Pulse (!) 104   Temp 99.5 F (37.5 C) (Axillary)   Resp (!) 0   Ht 5\' 9"  (1.753 m)   Wt (!) 170.6 kg (376 lb 1.7 oz)   SpO2 97%   BMI 55.54 kg/m   HEMODYNAMICS:    VENTILATOR SETTINGS: Vent Mode: PRVC FiO2 (%):  [70 %-80 %] 70 % Set Rate:  [18 bmp] 18 bmp Vt Set:  [650 mL] 650 mL PEEP:  [5 cmH20] 5 cmH20  INTAKE / OUTPUT: I/O last 3 completed shifts: In: 2650.2 [I.V.:1781.9; NG/GT:818.3; IV Piggyback:50]  Out: 1483 [Urine:125; Other:1358]  PHYSICAL EXAMINATION: General: Comatose, obese, anasarca Neuro: Pupils react symmetrically, no withdrawal, no spontaneous movement HEENT: NSC Cardiovascular: Distant HS, no M  noted, regular Lungs: Clear anteriorly without adventitious sounds Abdomen: Markedly obese, diminished BS, no palpable masses Extremities: Severe symmetric lower extremity edema  LABS:  BMET Recent Labs  Lab 03/16/17 0520 03/17/17 0459 03/18/17 0702  NA 136 135 135  K 3.8 4.4 4.9  CL 97* 95* 96*  CO2 30 26 26   BUN 58* 83* 91*  CREATININE 4.73* 6.07* 6.11*  GLUCOSE 164* 163* 150*    Electrolytes Recent Labs  Lab 03/14/17 1457 03/14/17 1830 03/15/17 0436 03/16/17 0520 03/17/17 0459 03/18/17 0702  CALCIUM 7.2* 6.9* 7.0* 7.0* 7.6* 7.8*  MG 2.0 1.9 2.0  --   --   --   PHOS 2.9 3.1 3.2  --   --   --     CBC Recent Labs  Lab 03/16/17 0520 03/17/17 0459 03/18/17 0702  WBC 13.9* 15.2* 15.8*  HGB 10.0* 10.1* 9.4*  HCT 30.5* 31.4* 28.6*  PLT 232 284 283    Coag's Recent Labs  Lab 03/13/17 0359  03/17/17 0459 03/18/17 0702 03/19/17 0511  APTT 43*   < > 86* 75* 37*  INR 1.22  --   --   --   --    < > = values in this interval not displayed.    Sepsis Markers No results for input(s): LATICACIDVEN, PROCALCITON, O2SATVEN in the last 168 hours.  ABG Recent Labs  Lab 03/12/17 2308 03/14/17 1050 03/18/17 1216  PHART 7.41 7.45 7.45  PCO2ART 40 44 39  PO2ART 64* 52* 73*    Liver Enzymes Recent Labs  Lab 03/16/17 0520 03/17/17 0459 03/18/17 0702  AST 126* 90* 55*  ALT 300* 204* 121*  ALKPHOS 164* 176* 140*  BILITOT 0.6 0.8 0.9  ALBUMIN 1.7* 1.9* 2.2*    Cardiac Enzymes No results for input(s): TROPONINI, PROBNP in the last 168 hours.  Glucose Recent Labs  Lab 03/19/17 0413 03/19/17 0507 03/19/17 0613 03/19/17 0715 03/19/17 0820 03/19/17 0927  GLUCAP 153* 148* 146* 139* 118* 153*    CXR: NNF   ASSESSMENT / PLAN: Severe anoxic encephalopathy Acute respiratory failure with hypoxia Left pleural effusion Shock, resolved Hypertension AKI, anuric Hypervolemia Severe obesity   Mild anemia without acute blood loss LUE DVT LUE  arterial ischemia MRSA right ankle wound infection DKA resolved Severe insulin resistance Severe hyperglycemia P:   Discontinue all therapies except ventilator support and anything needed to ensure comfort. Family to arrive today and tomorrow with plan for terminal extubation 01/19    Billy Fischer, MD PCCM service Mobile [phone] Pager [phone] 03/19/2017 1:23 PM

## 2017-03-19 NOTE — Plan of Care (Signed)
  Not Met (add Reason) Clinical Measurements: Respiratory complications will improve 03/19/2017 0535 - Not Met (add Reason) by Dena Billet, RN Note Pt remains intubated with high oxygen requirements Activity: Risk for activity intolerance will decrease 03/19/2017 0535 - Not Met (add Reason) by Dena Billet, RN Skin Integrity: Risk for impaired skin integrity will decrease 03/19/2017 0535 - Not Met (add Reason) by Dena Billet, RN Note Several skin issues with no signs of improvement

## 2017-03-19 NOTE — Progress Notes (Signed)
ANTICOAGULATION CONSULT NOTE  Pharmacy Consult for Heparin Indication: upper extremity DVT  Allergies  Allergen Reactions  . Naproxen   . Sulfa Antibiotics Hives    Patient Measurements: Height: 5\' 9"  (175.3 cm) Weight: (!) 376 lb 1.7 oz (170.6 kg) IBW/kg (Calculated) : 70.7 Heparin Dosing Weight: 105 kg  Vital Signs: Temp: 98.6 F (37 C) (01/18 0400) Temp Source: Axillary (01/18 0400) BP: 141/69 (01/18 0600) Pulse Rate: 96 (01/18 0600)  Labs: Recent Labs    03/17/17 0459 03/18/17 0702 03/19/17 0511  HGB 10.1* 9.4*  --   HCT 31.4* 28.6*  --   PLT 284 283  --   APTT 86* 75* 37*  HEPARINUNFRC 0.61 0.56 0.36  CREATININE 6.07* 6.11*  --     Estimated Creatinine Clearance: 26.2 mL/min (A) (by C-G formula based on SCr of 6.11 mg/dL (H)).   Medical History: Past Medical History:  Diagnosis Date  . Back pain   . Diabetes 1.5, managed as type 1 (HCC)   . Legg-Calve-Perthes disease   . Neuropathic pain   . Obesity   . Osteoarthritis     Assessment: 37 y/o M admitted with unresponsiveness and ventilated with DKA and found to have upper extremity DVT.   Heparin infusing at 2400 units/hr HL = 0.60, therapeutic.   Goal of Therapy:  Heparin level 0.3-0.7 units/ml Monitor platelets by anticoagulation protocol: Yes   Plan:  Will continue with current rate. Heparin level and CBC with AM labs.  01/16 0500 HL 0.61 therapeutic. Will continue current rate and will recheck HL w/ am labs.  01/18 @ 0500 HL 0.36 therapeutic. Will continue current rate and will recheck HL/CBC w/ am labs.  Thomasene Ripple, PharmD, BCPS Clinical Pharmacist 03/19/2017

## 2017-03-19 NOTE — Progress Notes (Signed)
[facility] [place] at [facility]   PATIENT NAME: Zachary Graves    MR#:  [id number]  DATE OF BIRTH:  07/23/1980  SUBJECTIVE:   No acute change in patient's mental status despite getting hemodialysis over the past 2 days. FiO2 has been weaned down to 70% from 100% yesterday. Family is at bedside. Plan for terminal weaning and extubation tomorrow once other family arrives from out of town.  REVIEW OF SYSTEMS:    Review of Systems  Unable to perform ROS: Intubated    DRUG ALLERGIES:   Allergies  Allergen Reactions  . Naproxen   . Sulfa Antibiotics Hives    VITALS:  Blood pressure (!) 146/75, pulse (!) 104, temperature 99.5 F (37.5 C), temperature source Axillary, resp. rate (!) 0, height 5\' 9"  (1.753 m), weight (!) 170.6 kg (376 lb 1.7 oz), SpO2 97 %.  PHYSICAL EXAMINATION:   Physical Exam  GENERAL:  37 y.o.-year-old obese patient lying in bed encephalopathic & intubated.  EYES: Pupils equal, round, reactive to light. No scleral icterus. HEENT: Head atraumatic, normocephalic. ET and OG tubes in place.  NECK:  Supple, no jugular venous distention. No thyroid enlargement, no tenderness.  LUNGS: Normal breath sounds bilaterally, no wheezing, rales, rhonchi. No use of accessory muscles of respiration.  CARDIOVASCULAR: S1, S2 normal. No murmurs, rubs, or gallops.  ABDOMEN: Soft, nontender, nondistended. Bowel sounds present. No organomegaly or mass.  EXTREMITIES: No cyanosis, clubbing or edema b/l.   Left upper ext. Edema > right. Anasarcic.  NEUROLOGIC: Encephalopathic and intubated.  PSYCHIATRIC: Encephalopathic and Intubated.  SKIN: No obvious rash, lesion, or ulcer. Signs of chronic venous stasis b/l and some dry excoriating areas in the left foot.   LABORATORY PANEL:   CBC Recent Labs  Lab 03/18/17 0702  WBC 15.8*  HGB 9.4*  HCT 28.6*  PLT 283    ------------------------------------------------------------------------------------------------------------------  Chemistries  Recent Labs  Lab 03/15/17 0436  03/18/17 0702  NA 137   < > 135  K 3.9   < > 4.9  CL 98*   < > 96*  CO2 30   < > 26  GLUCOSE 188*   < > 150*  BUN 36*   < > 91*  CREATININE 3.50*   < > 6.11*  CALCIUM 7.0*   < > 7.8*  MG 2.0  --   --   AST 169*   < > 55*  ALT 508*   < > 121*  ALKPHOS 135*   < > 140*  BILITOT 0.8   < > 0.9   < > = values in this interval not displayed.   ------------------------------------------------------------------------------------------------------------------  Cardiac Enzymes No results for input(s): TROPONINI in the last 168 hours. ------------------------------------------------------------------------------------------------------------------  RADIOLOGY:  Dg Chest Port 1 View  Result Date: 03/18/2017 CLINICAL DATA:  Acute respiratory failure.  Intubated patient. EXAM: PORTABLE CHEST 1 VIEW COMPARISON:  Single-view of the chest 03/17/2017 and 03/16/2017. FINDINGS: Support tubes and lines are unchanged. Left basilar airspace opacity persists. Right lung is clear. Heart size is upper normal. No pneumothorax. IMPRESSION: Support tubes and lines are unchanged and project in good position. No change in left basilar opacity.  No new abnormality. Electronically Signed   By: Drusilla Kanner M.D.   On: 03/18/2017 12:51     ASSESSMENT AND PLAN:   37 year old male with past medical history of morbid obesity, diabetes, medical noncompliance, diabetic neuropathy who presented to the hospital due to altered mental status and noted to be in acute  rhabdomyolysis and also acute diabetic ketoacidosis.  1. Altered mental status/encephalopathy- pt. Remains encephalopathic and does not respond to verbal stimuli and remains off sedation. -CT head on admission was positive for possible subacute CVA, but MRI of the brain 03/16/17 showing evidence  of anoxic brain injury. Neurology discussed plan with patient's mother at bedside. She does not want to pursue tracheostomy/PEG tube. - plan to do terminal wean tomorrow when other family arrives.   2. Sepsis-no clear source identified. Off abx now.   -Cultures so far remain negative. Off vasopressors and stress dose steroids. BP improved.  - Follow hemodynamics.  Pt's right ankle wound cultures are + for MRSA but BC remain (-).   3. Acute diabetic ketoacidosis-secondary to patient's noncompliance. -Improved with IV fluids, insulin drip. Anion gap currently closed.   4. Acute renal failure-secondary to sepsis. Patient remains oliguric Patient had hemodialysis the past 2 days and his mental status has not improved.  5. Left upper extremity DVT-off heparin gtt tomorrow as family leaning towards comfort care.  6. Acute rhabdomyolysis- improved w/ IV fluids/sodium bicarb.  - CK's normalized.  7. Abnormal LFTs-secondary to shock liver from septic shock and multiorgan failure. - LFT's improving.   Patient's family is in agreement towards comfort care and plan for terminal extubation and weaning tomorrow once patient's other family arrives from out of town.   All the records are reviewed and case discussed with Care Management/Social Worker. Management plans discussed with the patient, family and they are in agreement.  CODE STATUS: Full  DVT Prophylaxis: Heparin gtt  TOTAL TIME TAKING CARE OF THIS PATIENT: 25 minutes.    Houston Siren M.D on 03/19/2017 at 3:26 PM  Between 7am to 6pm - Pager - [phone]  After 6pm go to [email or web address] - Social research officer, government  [facility]  Office  [phone]  CC: Primary care physician; Patient, No Pcp Per

## 2017-03-19 NOTE — Progress Notes (Signed)
[place], [place] 03/19/17  Subjective:   Patient remains critically ill.  Mother at bedside.  Patient intubated and does not respond to commands. No sedation Dialysis yesterday.  Family is desiring to withdraw care.   Objective:  Vital signs in last 24 hours:  Temp:  [98.6 F (37 C)-99 F (37.2 C)] 98.6 F (37 C) (01/18 0400) Pulse Rate:  [94-117] 96 (01/18 0600) Resp:  [0-35] 0 (01/18 0600) BP: (78-177)/(50-76) 141/69 (01/18 0600) SpO2:  [84 %-99 %] 97 % (01/18 0600) FiO2 (%):  [40 %-80 %] 80 % (01/18 0416) Weight:  [170.6 kg (376 lb 1.7 oz)] 170.6 kg (376 lb 1.7 oz) (01/18 0155)  Weight change: 6.1 kg (13 lb 7.2 oz) Filed Weights   03/18/17 1200 03/18/17 1600 03/19/17 0155  Weight: (!) 170.6 kg (376 lb 1.7 oz) (!) 170.6 kg (376 lb 1.7 oz) (!) 170.6 kg (376 lb 1.7 oz)    Intake/Output:    Intake/Output Summary (Last 24 hours) at 03/19/2017 0921 Last data filed at 03/19/2017 0600 Gross per 24 hour  Intake 2094.17 ml  Output 1433 ml  Net 661.17 ml     Physical Exam: General: Patient lying in bed. Intubated  HEENT Normocephalic. Atraumatic. Pupils equal   Neck Supple. IJ catheter  in place   Pulm/lungs Clear to auscultation   CVS/Heart Regular rhythm. Tachycardic   Abdomen:  Soft.   Extremities: Generalized edema. Left upper extremity greater than right    Neurologic: Does not follow commands. Off of sedation- non responsive   Skin: Bruising on left upper pinna and temporal area.   Access: Right IJ dialysis catheter        Basic Metabolic Panel:  Recent Labs  Lab 03/14/17 0634 03/14/17 1039 03/14/17 1457 03/14/17 1830 03/15/17 0436 03/16/17 0520 03/17/17 0459 03/18/17 0702  NA 135 136 136 136 137 136 135 135  K 3.9 4.1 3.9 3.8 3.9 3.8 4.4 4.9  CL 99* 99* 98* 98* 98* 97* 95* 96*  CO2 _0 GLUCOSE 209* 200* 212* 184* 188* 164* 163* 150*  BUN 26* 25* 28* 29* 36* 58* 83* 91*  CREATININE 2.66* 2.67*  2.81* 2.97* 3.50* 4.73* 6.07* 6.11*  CALCIUM 7.1* 7.1* 7.2* 6.9* 7.0* 7.0* 7.6* 7.8*  MG 1.9 1.8 2.0 1.9 2.0  --   --   --   PHOS 2.9 3.0 2.9 3.1 3.2  --   --   --      CBC: Recent Labs  Lab 03/14/17 0514 03/15/17 0436 03/16/17 0520 03/17/17 0459 03/18/17 0702  WBC 13.2* 16.7* 13.9* 15.2* 15.8*  HGB 11.3* 10.5* 10.0* 10.1* 9.4*  HCT 33.8* 31.7* 30.5* 31.4* 28.6*  MCV 82.5 82.6 83.3 84.0 83.7  PLT 153 205 232 284 283      Lab Results  Component Value Date   HEPBSAG Negative 03/17/2017      Microbiology:  Recent Results (from the past 240 hour(s))  Urine culture     Status: None   Collection Time: 03/29/2017  8:12 PM  Result Value Ref Range Status   Specimen Description   Final    URINE, RANDOM Performed at [facility], [address] [place], [place] [address]    Special Requests   Final    NONE Performed at [facility], [address], [place], [place] [address]    Culture   Final    NO GROWTH Performed at [facility],  [address] [place], [place] [address]    Report Status 03/12/2017 FINAL  Final  Blood Culture (routine x 2)     Status: None   Collection Time: 03/31/2017 11:28 PM  Result Value Ref Range Status   Specimen Description BLOOD LT [facility]  Final   Special Requests   Final    BOTTLES DRAWN AEROBIC AND ANAEROBIC Blood Culture adequate volume   Culture   Final    NO GROWTH 5 DAYS Performed at [facility], [address] [place], [place] [address]    Report Status 03/15/2017 FINAL  Final  Blood Culture (routine x 2)     Status: None   Collection Time: 03/25/2017 11:28 PM  Result Value Ref Range Status   Specimen Description BLOOD RT [facility]  Final   Special Requests   Final    BOTTLES DRAWN AEROBIC AND ANAEROBIC Blood Culture adequate volume   Culture   Final    NO GROWTH 5 DAYS Performed at [facility], [address] [place], [place] [address]    Report Status 03/15/2017 FINAL  Final  MRSA PCR  Screening     Status: Abnormal   Collection Time: 03/11/17 12:20 AM  Result Value Ref Range Status   MRSA by PCR POSITIVE (A) NEGATIVE Final    Comment:        The GeneXpert MRSA Assay (FDA approved for NASAL specimens only), is one component of a comprehensive MRSA colonization surveillance program. It is not intended to diagnose MRSA infection nor to guide or monitor treatment for MRSA infections. RESULT CALLED TO, READ BACK BY AND VERIFIED WITH: BARBARA THAO ON 03/11/17 AT 0310 JAG Performed at [facility], [address] [place], [place] [address]   Aerobic/Anaerobic Culture (surgical/deep wound)     Status: None   Collection Time: 03/11/17 12:53 AM  Result Value Ref Range Status   Specimen Description   Final    ANKLE RIGHT ANKLE Performed at [facility], [address], [place], [place] [address]    Special Requests   Final    NONE Performed at [facility], [address] [place], [place] [address]    Gram Stain   Final    RARE WBC PRESENT, PREDOMINANTLY PMN FEW GRAM POSITIVE RODS RARE GRAM POSITIVE COCCI IN PAIRS RARE YEAST    Culture   Final    ABUNDANT METHICILLIN RESISTANT STAPHYLOCOCCUS AUREUS MODERATE GROUP B STREP(S.AGALACTIAE)ISOLATED TESTING AGAINST S. AGALACTIAE NOT ROUTINELY PERFORMED DUE TO PREDICTABILITY OF AMP/PEN/VAN SUSCEPTIBILITY. FEW CANDIDA TROPICALIS NO ANAEROBES ISOLATED Performed at [facility], [address] [place], [place] [address]    Report Status 03/15/2017 FINAL  Final   Organism ID, Bacteria METHICILLIN RESISTANT STAPHYLOCOCCUS AUREUS  Final      Susceptibility   Methicillin resistant staphylococcus aureus - MIC*    CIPROFLOXACIN >=8 RESISTANT Resistant     ERYTHROMYCIN <=0.25 SENSITIVE Sensitive     GENTAMICIN <=0.5 SENSITIVE Sensitive     OXACILLIN RESISTANT Resistant     TETRACYCLINE <=1 SENSITIVE Sensitive     VANCOMYCIN <=0.5 SENSITIVE Sensitive     TRIMETH/SULFA <=10 SENSITIVE  Sensitive     CLINDAMYCIN <=0.25 SENSITIVE Sensitive     RIFAMPIN <=0.5 SENSITIVE Sensitive     Inducible Clindamycin NEGATIVE Sensitive     * ABUNDANT METHICILLIN RESISTANT STAPHYLOCOCCUS AUREUS    Coagulation Studies: No results for input(s): LABPROT, INR in the last 72 hours.  Urinalysis: No results for input(s): COLORURINE, LABSPEC, [place],  GLUCOSEU, HGBUR, BILIRUBINUR, KETONESUR, PROTEINUR, UROBILINOGEN, NITRITE, LEUKOCYTESUR in the last 72 hours.  Invalid input(s): APPERANCEUR    Imaging: Dg Chest Port 1 View  Result Date: 03/18/2017 CLINICAL DATA:  Acute respiratory failure.  Intubated patient. EXAM: PORTABLE CHEST 1 VIEW COMPARISON:  Single-view of the chest 03/17/2017 and 03/16/2017. FINDINGS: Support tubes and lines are unchanged. Left basilar airspace opacity persists. Right lung is clear. Heart size is upper normal. No pneumothorax. IMPRESSION: Support tubes and lines are unchanged and project in good position. No change in left basilar opacity.  No new abnormality. Electronically Signed   By: Inge Rise M.D.   On: 03/18/2017 12:51     Medications:   . sodium chloride    . heparin 2,400 Units/hr (03/19/17 0600)  . insulin (NOVOLIN-R) infusion 6.7 Units/hr (03/19/17 0615)  . phenylephrine (NEO-SYNEPHRINE) Adult infusion 0 mcg/min (03/18/17 1840)  . propofol (DIPRIVAN) infusion 20 mcg/kg/min (03/19/17 3614)   . chlorhexidine gluconate (MEDLINE KIT)  15 mL Mouth Rinse BID  . famotidine  20 mg Per Tube QHS  . feeding supplement (PRO-STAT SUGAR FREE 64)  60 mL Per Tube BID  . feeding supplement (VITAL HIGH PROTEIN)  1,000 mL Per Tube Q24H  . mouth rinse  15 mL Mouth Rinse 10 times per day  . multivitamin  15 mL Per Tube Daily   sodium chloride, acetaminophen, fentaNYL (SUBLIMAZE) injection, hydrALAZINE, ondansetron (ZOFRAN) IV  Assessment/ Plan:  37 y.o. caucasian  male with insulin dependent diabetes mellitus, hypertension, morbid obesity, diabetic peripheral  neuropathy, Legg-calve-Perthes Disease, osteoarthritis, who was admitted to [facility] on1/9/2019for diabetic ketoacidosis.   1. Acute renal failure. Poor renal output.  Potassium normal ( 4.9).  Family is withdrawing care. No dialysis today   2. DKA secondary to poorly controled insulin dependent diabetes.  Continue management by ICU team.   3. Rhabdomyolysis required HD.  CK level has decreased.   4. Generalized edema.  Hemodialysis treatment yesterday.   5. Acute respiratory failure.  Requiring ventilator support.   Will sign off Please re-consult as necessary     LOS: [place] 1/18/20199:21 [place], [place]

## 2017-03-20 MED ORDER — GLYCOPYRROLATE 0.2 MG/ML IJ SOLN
0.2000 mg | INTRAMUSCULAR | Status: DC | PRN
  Administered 2017-03-21: 0.2 mg via INTRAVENOUS
  Filled 2017-03-20 (×2): qty 1

## 2017-03-20 MED ORDER — MORPHINE BOLUS VIA INFUSION
5.0000 mg | INTRAVENOUS | Status: DC | PRN
  Filled 2017-03-20: qty 5

## 2017-03-20 MED ORDER — SODIUM CHLORIDE 0.9 % IV SOLN
2.0000 mg/h | INTRAVENOUS | Status: DC
  Administered 2017-03-20: 2 mg/h via INTRAVENOUS
  Filled 2017-03-20: qty 20

## 2017-03-20 MED ORDER — SCOPOLAMINE 1 MG/3DAYS TD PT72
1.0000 | MEDICATED_PATCH | TRANSDERMAL | Status: DC
  Administered 2017-03-20: 19:00:00 1.5 mg via TRANSDERMAL
  Filled 2017-03-20: qty 1

## 2017-03-20 NOTE — Progress Notes (Signed)
°   03/20/17 2045  Clinical Encounter Type  Visited With Health care provider  Visit Type Follow-up  Referral From Nurse  Consult/Referral To Chaplain  Spiritual Encounters  Spiritual Needs Emotional   CH followed up with nurse on 1C. PT was moved from CCU to 1C for comfort care. CH prayed for PT.

## 2017-03-20 NOTE — Progress Notes (Signed)
PULMONARY / CRITICAL CARE MEDICINE   Name: Nykolas Andrade MRN: [id number] DOB: April 18, 1980    ADMISSION DATE:  22-Mar-2017  PT PROFILE:   67 M with PMH of DM, obesity, chronic R foot ulceration presented to ED 01/09 with unresponsiveness requiring intubation and found to be in severe DKA with MOSF  MAJOR EVENTS/TEST RESULTS: 01/09 admitted as above. Intubated in ED. DKA protocol, vasopressors, HCO3 infusion, empiric antibiotics. Tox screnn + for opiates 01/10 LUE [place]: positive for occlusive DVT extending from the peripheral aspect of the left subclavian vein to involve the left axillary and one of the paired left brachial veins 01/10 CT head: Normal noncontrast CT of the brain. Moderate left temporal subcutaneous hematoma 01/10 Neurology consultation: "Due to the fact that CNS infection remains in the differential broad-spectrum antibiotic coverage is indicated". Meningitis coverage initiated 01/10 EEG: Diffuse slowing, no epileptiform activity 01/10 Vasc surg consultation: "Left upper extremity with signs of ischemia.  This is likely multifactorial on 3 different pressor agents with continued hypotension.  At this point, the patient is hemodynamically unstable and on maximal support and would not be a candidate for any sort of surgical or interventional therapy.  If he can tolerate anticoagulation, this can certainly be considered" 01/10 Heparin infusion initiated 01/10 Nephrology consultation: anuria noted. CRRT initiaited 01/11 repeat CT head: Focal areas of low attenuation in the internal capsule medial to the lentiform nuclei bilaterally, new compared to the prior CT and may represent ischemia. Further evaluation with MRI recommended 01/13 CRRT discontinued with plan for intermittent HD thereafter 01/14 MRI brain: Symmetric nonspecific globus pallidus lesions with reduced diffusion. Some differential considerations include sequelae of hypoxic ischemic injury, carbon monoxide/cyanide toxicity, or  toxic encephalopathy  01/15 Comatose off of all sedation. Remains on insulin and heparin infusions.  01/15 Neurology conveyed poor prognosis to pt's mother. She indicated that she would not wish to pursue trach tube or G tube 01/15 EEG:  abnormal electroencephalogram secondary to a discontinuous background activity that is slow and poorly organized.  There vertex sharp activity noted may represent epileptiform transients versus poorly formed sleep transients.  Clinical correlation recommended 01/16 Remains comatose. Plan HD X 2 days to see if any improvement in cognition. If not, will discuss terminal extubation 01/18. Mother apprised 01/17: Episodes of hypoxia and tachypnea overnight and this morning; placed back on [facility] 01/18 Worsening gas exchange. No improvement in neurological status. Family prepared for terminal extubation. Other family members to arrive. Plan terminal extubation 01/19  INDWELLING DEVICES:: R IJ CVL 01/09 >> 01/10 ETT 01/09 >>  L femoral CVL 01/10 >>  R IJ HD cath 01/10 >>   MICRO DATA: R ankle wound 01/10 >> abundant MRSA  Urine 01/09 >> NEG Blood 01/09 >> NEG  ANTIMICROBIALS:   Pip-tazo 01/09 >> 01/10 Ceftriaxone 01/10 >> 01/15 Vanc 01/09 >> 01/18  SUBJECTIVE:  Remains comatose off of all sedatives  VITAL SIGNS: BP 123/68   Pulse (!) 104   Temp 98.9 F (37.2 C) (Axillary)   Resp 17   Ht 5\' 9"  (1.753 m)   Wt (!) 170.6 kg (376 lb 1.7 oz)   SpO2 100%   BMI 55.54 kg/m   HEMODYNAMICS:    VENTILATOR SETTINGS: Vent Mode: PRVC FiO2 (%):  [50 %-60 %] 50 % Set Rate:  [18 bmp] 18 bmp Vt Set:  [650 mL] 650 mL PEEP:  [5 cmH20] 5 cmH20 Plateau Pressure:  [19 cmH20-23 cmH20] 19 cmH20  INTAKE / OUTPUT: I/O last 3 completed shifts:  In: 1609.9 [I.V.:879.6; NG/GT:730.3] Out: 175 [Urine:175]  PHYSICAL EXAMINATION: General: Comatose, obese, anasarca Neuro: Pupils react symmetrically, no withdrawal, no spontaneous movement HEENT: NSC Cardiovascular:  Distant HS, no M noted, regular Lungs: Clear anteriorly without adventitious sounds Abdomen: Markedly obese, diminished BS, no palpable masses Extremities: Severe symmetric lower extremity edema  LABS:  BMET Recent Labs  Lab 03/16/17 0520 03/17/17 0459 03/18/17 0702  NA 136 135 135  K 3.8 4.4 4.9  CL 97* 95* 96*  CO2 30 26 26   BUN 58* 83* 91*  CREATININE 4.73* 6.07* 6.11*  GLUCOSE 164* 163* 150*    Electrolytes Recent Labs  Lab 03/14/17 1457 03/14/17 1830 03/15/17 0436 03/16/17 0520 03/17/17 0459 03/18/17 0702  CALCIUM 7.2* 6.9* 7.0* 7.0* 7.6* 7.8*  MG 2.0 1.9 2.0  --   --   --   PHOS 2.9 3.1 3.2  --   --   --     CBC Recent Labs  Lab 03/16/17 0520 03/17/17 0459 03/18/17 0702  WBC 13.9* 15.2* 15.8*  HGB 10.0* 10.1* 9.4*  HCT 30.5* 31.4* 28.6*  PLT 232 284 283    Coag's Recent Labs  Lab 03/17/17 0459 03/18/17 0702 03/19/17 0511  APTT 86* 75* 37*    Sepsis Markers No results for input(s): LATICACIDVEN, PROCALCITON, O2SATVEN in the last 168 hours.  ABG Recent Labs  Lab 03/14/17 1050 03/18/17 1216  PHART 7.45 7.45  PCO2ART 44 39  PO2ART 52* 73*    Liver Enzymes Recent Labs  Lab 03/16/17 0520 03/17/17 0459 03/18/17 0702  AST 126* 90* 55*  ALT 300* 204* 121*  ALKPHOS 164* 176* 140*  BILITOT 0.6 0.8 0.9  ALBUMIN 1.7* 1.9* 2.2*    Cardiac Enzymes No results for input(s): TROPONINI, PROBNP in the last 168 hours.  Glucose Recent Labs  Lab 03/19/17 0413 03/19/17 0507 03/19/17 0613 03/19/17 0715 03/19/17 0820 03/19/17 0927  GLUCAP 153* 148* 146* 139* 118* 153*    CXR: NNF   ASSESSMENT / PLAN: Severe anoxic encephalopathy Acute respiratory failure with hypoxia Left pleural effusion Shock, resolved Hypertension AKI, anuric Hypervolemia Severe obesity   Mild anemia without acute blood loss LUE DVT LUE arterial ischemia MRSA right ankle wound infection DKA resolved Severe insulin resistance Severe hyperglycemia P:      Terminal extubation and full comfort care today.  Withdrawal protocol implemented.  I spoke with multiple family members and friends regarding the process of terminal extubation and all questions were answered.    Billy Fischer, MD PCCM service Mobile [phone] Pager [phone] 03/20/2017 1:31 PM

## 2017-03-20 NOTE — Progress Notes (Signed)
Pt extubated at 1250 to [facility]. Lungs Rhonchi to auscultation. Weak, non-productive cough. NGT suction as needed. Pt has remained in NSR on cardiac monitor, BP WDL. Pt remains unresponsive to external stimuli. Morphine gtt initiated at 2mg /ml for comfort. Family has been updated at bedside.

## 2017-03-20 NOTE — Progress Notes (Signed)
°   03/20/17 1245  Clinical Encounter Type  Visited With Patient and family together  Visit Type Follow-up  Referral From Nurse  Consult/Referral To Chaplain  Spiritual Encounters  Spiritual Needs Prayer;Emotional;Grief support   CH was stopped by nurse to talk to family and pray as life support was being removed from PT. CH prayed and offered support.

## 2017-03-20 NOTE — Progress Notes (Signed)
°   03/20/17 1330  Clinical Encounter Type  Visited With Family  Visit Type Follow-up  Referral From Nurse  Consult/Referral To Chaplain  Spiritual Encounters  Spiritual Needs Emotional   CH followed-up with PT's family. Offered support and will check in later.

## 2017-03-20 NOTE — Progress Notes (Signed)
Patient is extubated to 2 lpm o2 Zachary Graves

## 2017-03-20 NOTE — Progress Notes (Signed)
[facility] - [place] at [facility]   PATIENT NAME: Zachary Graves    MR#:  [id number]  DATE OF BIRTH:  Dec 28, 1980  SUBJECTIVE:   No acute change in patient's mental status despite getting hemodialysis over the past 2 days.  Family is at bedside. Plan for terminal weaning and extubation tomorrow once other family arrives from out of town.  REVIEW OF SYSTEMS:    Review of Systems  Unable to perform ROS: Intubated    DRUG ALLERGIES:   Allergies  Allergen Reactions  . Naproxen   . Sulfa Antibiotics Hives    VITALS:  Blood pressure 123/68, pulse (!) 104, temperature 98.9 F (37.2 C), temperature source Axillary, resp. rate 17, height 5\' 9"  (1.753 m), weight (!) 170.6 kg (376 lb 1.7 oz), SpO2 100 %.  PHYSICAL EXAMINATION:   Physical Exam  GENERAL:  37 y.o.-year-old obese patient lying in bed encephalopathic & intubated.  EYES: Pupils equal, round, reactive to light. No scleral icterus. HEENT: Head atraumatic, normocephalic. ET and OG tubes in place.  NECK:  Supple, no jugular venous distention. No thyroid enlargement, no tenderness.  LUNGS: Normal breath sounds bilaterally, no wheezing, rales, rhonchi. No use of accessory muscles of respiration.  CARDIOVASCULAR: S1, S2 normal. No murmurs, rubs, or gallops.  ABDOMEN: Soft, nontender, nondistended. Bowel sounds present. No organomegaly or mass.  EXTREMITIES: No cyanosis, clubbing or edema b/l.   Left upper ext. Edema > right. Anasarcic.  NEUROLOGIC: Encephalopathic and intubated.  PSYCHIATRIC: Encephalopathic and Intubated.  SKIN: No obvious rash, lesion, or ulcer. Signs of chronic venous stasis b/l and some dry excoriating areas in the left foot.   LABORATORY PANEL:   CBC Recent Labs  Lab 03/18/17 0702  WBC 15.8*  HGB 9.4*  HCT 28.6*  PLT 283   ------------------------------------------------------------------------------------------------------------------  Chemistries  Recent Labs  Lab  03/15/17 0436  03/18/17 0702  NA 137   < > 135  K 3.9   < > 4.9  CL 98*   < > 96*  CO2 30   < > 26  GLUCOSE 188*   < > 150*  BUN 36*   < > 91*  CREATININE 3.50*   < > 6.11*  CALCIUM 7.0*   < > 7.8*  MG 2.0  --   --   AST 169*   < > 55*  ALT 508*   < > 121*  ALKPHOS 135*   < > 140*  BILITOT 0.8   < > 0.9   < > = values in this interval not displayed.   ------------------------------------------------------------------------------------------------------------------  Cardiac Enzymes No results for input(s): TROPONINI in the last 168 hours. ------------------------------------------------------------------------------------------------------------------  RADIOLOGY:  No results found.   ASSESSMENT AND PLAN:   37 year old male with past medical history of morbid obesity, diabetes, medical noncompliance, diabetic neuropathy who presented to the hospital due to altered mental status and noted to be in acute rhabdomyolysis and also acute diabetic ketoacidosis.  1. Altered mental status/encephalopathy- pt. Remains encephalopathic and does not respond to verbal stimuli and remains off sedation. -CT head on admission was positive for possible subacute CVA, but MRI of the brain 03/16/17 showing evidence of anoxic brain injury. Neurology discussed plan with patient's mother at bedside. She does not want to pursue tracheostomy/PEG tube. - plan to do terminal wean today when other family arrives.   2. Sepsis-no clear source identified. Off abx now.   -Cultures so far remain negative. Off vasopressors and stress dose steroids. BP improved.  -  Follow hemodynamics.  Pt's right ankle wound cultures are + for MRSA but BC remain (-).   3. Acute diabetic ketoacidosis-secondary to patient's noncompliance. -Improved with IV fluids, insulin drip. Anion gap currently closed.   4. Acute renal failure-secondary to sepsis. Patient remains oliguric Patient had hemodialysis the past 2 days and his mental  status has not improved.  5. Left upper extremity DVT-off heparin gtt as family leaning towards comfort care.  6. Acute rhabdomyolysis- improved w/ IV fluids/sodium bicarb.  - CK's normalized.  7. Abnormal LFTs-secondary to shock liver from septic shock and multiorgan failure. - LFT's improving.   Patient's family is in agreement towards comfort care and plan for terminal extubation and weaning today once patient's other family arrives from out of town.   All the records are reviewed and case discussed with Care Management/Social Worker. Management plans discussed with the patient, family and they are in agreement.  CODE STATUS:DNR    TOTAL TIME TAKING CARE OF THIS PATIENT: 15 minutes.    Enedina Finner M.D on 03/20/2017 at 12:50 PM  Between 7am to 6pm - Pager - [phone]  After 6pm go to [email or web address] - Social research officer, government  [facility] [place] [facility]  Office  [phone]  CC: Primary care physician; Patient, No Pcp Per

## 2017-03-20 NOTE — Progress Notes (Signed)
Pt's family at bedside throughout the night.  Gave emotional support and met needs of patient for comfort.  Pt requiring frequent oral and ETT suctioning overnight.  Continuing to await arrival of family members prior to the decision for possible terminal wean.

## 2017-03-22 NOTE — Progress Notes (Signed)
[facility] consult with family of patient. Patient passed peacefully according to family. Family is making plans for patient's father to stay locally so he does not have to drive two hours to get home. Patient's sister, mother, and father are in room.  CH available if needed.

## 2017-04-02 NOTE — Progress Notes (Signed)
Nutrition Brief Note  Chart reviewed. Patient has now transitioned to comfort care.   No further nutrition interventions warranted at this time.  Please re-consult RD as needed.   Helane Rima, MS, RD, LDN Office: [phone] Pager: [phone] After Hours/Weekend Pager: [phone]

## 2017-04-02 NOTE — Progress Notes (Signed)
Pt without respirations or heartbeat-emotional support given to parents.

## 2017-04-02 NOTE — Care Management (Addendum)
Post demise note entry: Social security disability [phone] called this RNCM requesting [employer] to "close out his disability and pay his medical expenses per his mother's request".  I attempted to call them back but was on hold for an extended amount of time. "payor communication" note entered in medical record.

## 2017-04-02 NOTE — Plan of Care (Signed)
VS WDL, free of falls during shift.  No s/s pain.  Increased secretions, improved w/ PRN IV Robinul 0.2mg .  Mother at bedside, call bell within reach.  WCTM.

## 2017-04-02 NOTE — Progress Notes (Signed)
[employer] [facility] - [place] at [facility]   PATIENT NAME: Zachary Graves    MR#:  [id number]  DATE OF BIRTH:  1980-08-14  SUBJECTIVE:   Mom at bedside Getting morphine gtt No new issues  REVIEW OF SYSTEMS:    Review of Systems  Unable to perform ROS: Patient unresponsive    DRUG ALLERGIES:   Allergies  Allergen Reactions  . Naproxen   . Sulfa Antibiotics Hives    VITALS:  Blood pressure (!) 154/80, pulse 100, temperature 98.3 F (36.8 C), temperature source Oral, resp. rate (!) 22, height 5\' 9"  (1.753 m), weight (!) 170.6 kg (376 lb 1.7 oz), SpO2 95 %.  PHYSICAL EXAMINATION:   Physical Exam  GENERAL:  37 y.o.-year-old obese patient lying in bed encephalopathic  LUNGS: Normal breath sounds bilaterally, no wheezing, rales, rhonchi. No use of accessory muscles of respiration.  CARDIOVASCULAR: S1, S2 normal. No murmurs, rubs, or gallops.  ABDOMEN: Soft, nontender, nondistended. Bowel sounds present. No organomegaly or mass.  EXTREMITIES: No cyanosis, clubbing or edema b/l.   Left upper ext. Edema > right. Anasarcic.  NEUROLOGIC: Encephalopathic  PSYCHIATRIC: Encephalopathic  SKIN:  Signs of chronic venous stasis b/l and some dry excoriating areas in the left foot.   LABORATORY PANEL:   CBC Recent Labs  Lab 03/18/17 0702  WBC 15.8*  HGB 9.4*  HCT 28.6*  PLT 283   ------------------------------------------------------------------------------------------------------------------  Chemistries  Recent Labs  Lab 03/15/17 0436  03/18/17 0702  NA 137   < > 135  K 3.9   < > 4.9  CL 98*   < > 96*  CO2 30   < > 26  GLUCOSE 188*   < > 150*  BUN 36*   < > 91*  CREATININE 3.50*   < > 6.11*  CALCIUM 7.0*   < > 7.8*  MG 2.0  --   --   AST 169*   < > 55*  ALT 508*   < > 121*  ALKPHOS 135*   < > 140*  BILITOT 0.8   < > 0.9   < > = values in this interval not displayed.    ------------------------------------------------------------------------------------------------------------------  Cardiac Enzymes No results for input(s): TROPONINI in the last 168 hours. ------------------------------------------------------------------------------------------------------------------  RADIOLOGY:  No results found.   ASSESSMENT AND PLAN:   37 year old male with past medical history of morbid obesity, diabetes, medical noncompliance, diabetic neuropathy who presented to the hospital due to altered mental status and noted to be in acute rhabdomyolysis and also acute diabetic ketoacidosis.  1. Altered mental status/encephalopathy- terminally extubated 2. Sepsis   3. Acute diabetic ketoacidosis-secondary to patient's noncompliance.   4. Acute renal failure-secondary to sepsis.  5. Left upper extremity DVT  6. Acute rhabdomyolysis  7. Abnormal LFTs  Cont comfort measures  All the records are reviewed and case discussed with Care Management/Social Worker. CODE STATUS:DNR    TOTAL TIME TAKING CARE OF THIS PATIENT: 15 minutes.    Enedina Finner M.D on 03-28-17 at 1:05 PM  Between 7am to 6pm - Pager - [phone]  After 6pm go to [email or web address] - Social research officer, government  [facility] [place] [facility]  Office  [phone]  CC: Primary care physician; Patient, No Pcp Per

## 2017-04-02 NOTE — Progress Notes (Signed)
CH consulted with patient's father per requests from patient's nurse Morrie Sheldon. CH spent time with the patients father and listened as he spoke about his son loving old cars and cats. Patient's father stated losing his son is the hardest thing that has ever happened and he has been through a divorce. It was a time of healing as we talked about his son and things that he could do to remember him. CH provided prayer, shared in his grief, and empathetic listening. CH will follow-up at requests of family or nurse. CH's are available 24/7 this seemed to offer a peacefulness to the patient's father as he does not want to be alone when his son passes.

## 2017-04-02 DEATH — deceased

## 2017-04-30 NOTE — Death Summary Note (Signed)
DEATH SUMMARY   Patient Details  Name: Zachary Graves MRN: [id number] DOB: 06-20-1980  Admission/Discharge Information   Admit Date:  04-09-2017  Date of Death: Date of Death: 2017/04/20  Time of Death: Time of Death: Jul 22, 2223  Length of Stay: Jul 11, 2022  Referring Physician: Patient, No Pcp Per   Reason(s) for Hospitalization  Altered mental status  Diagnoses  Preliminary cause of death:  Secondary Diagnoses (including complications and co-morbidities):  Principal Problem:   Severe sepsis with septic shock (HCC) Active Problems:   DKA (diabetic ketoacidoses) (HCC)   Multi-organ failure with liver failure (HCC)   Acute respiratory failure with hypercapnia (HCC)   Pressure injury of skin   Rhabdomyolysis   Acute metabolic encephalopathy   Brief Hospital Course (including significant findings, care, treatment, and services provided and events leading to death)  Zachary Graves is a 37 year old male with past medical history of morbid obesity, diabetes, medical noncompliance, diabetic neuropathy who presented to the hospital due to altered mental status and noted to be in acute rhabdomyolysis and also acute diabetic ketoacidosis.  1. Altered mental status/encephalopathy- terminally extubated 2. Sepsis   3. Acute diabetic ketoacidosis-secondary to patient's noncompliance.   4. Acute renal failure-secondary to sepsis.  5. Left upper extremity DVT  6. Acute rhabdomyolysis  7. Abnormal LFTs  Cont comfort measures. Pt was critically ill and multiorgan failure. Family chose for comfort measures since pt continued to declined.      Pertinent Labs and Studies  Significant Diagnostic Studies Mr Brain 4 Contrast  Result Date: 03/15/2017 CLINICAL DATA:  37 y/o M; found on floor, severe DKA, acute kidney injury, possible sepsis, altered level of consciousness. EXAM: MRI HEAD WITHOUT CONTRAST TECHNIQUE: Multiplanar, multiecho pulse sequences of the brain and surrounding structures  were obtained without intravenous contrast. COMPARISON:  03/12/2017 CT head FINDINGS: Brain: Bilateral globe pallidus increased T2 FLAIR signal and reduced diffusion. No hemorrhage. No additional focus of reduced diffusion to suggest acute or early subacute infarct. No mass effect, extra-axial collection, or effacement of basilar cisterns. Vascular: Normal flow voids. Skull and upper cervical spine: Normal marrow signal. Sinuses/Orbits: Paranasal sinus mucosal thickening and mastoid opacification likely due to intubation. Orbits are unremarkable. Other: None. IMPRESSION: Symmetric nonspecific globus pallidus lesions with reduced diffusion. Some differential considerations include sequelae of hypoxic ischemic injury, carbon monoxide/cyanide toxicity, or toxic encephalopathy (notably heroin, MDMA). Electronically Signed   By: Mitzi Hansen M.D.   On: 03/15/2017 22:07   Dg Chest Port 1 View  Result Date: 03/18/2017 CLINICAL DATA:  Acute respiratory failure.  Intubated patient. EXAM: PORTABLE CHEST 1 VIEW COMPARISON:  Single-view of the chest 03/17/2017 and 03/16/2017. FINDINGS: Support tubes and lines are unchanged. Left basilar airspace opacity persists. Right lung is clear. Heart size is upper normal. No pneumothorax. IMPRESSION: Support tubes and lines are unchanged and project in good position. No change in left basilar opacity.  No new abnormality. Electronically Signed   By: Drusilla Kanner M.D.   On: 03/18/2017 12:51   Dg Chest Port 1 View  Result Date: 03/17/2017 CLINICAL DATA:  Hypoxia EXAM: PORTABLE CHEST 1 VIEW COMPARISON:  March 16, 2017 FINDINGS: Endotracheal tube tip is 3.5 cm above the carina. Central catheter tip is in the superior vena cava. Nasogastric tube tip and side port are below the diaphragm. No pneumothorax. There is atelectatic change and areas of consolidation in the left base. There is a small left pleural effusion. Lungs elsewhere are clear. Heart is upper normal in  size with pulmonary vascularity  within normal limits. No adenopathy. No bone lesions. An azygos lobe is noted on the right, an anatomic variant. IMPRESSION: Tube and catheter positions as described without pneumothorax. Airspace consolidation left base medially with left base atelectasis and small pleural effusion on the left as well. Right lung clear. Stable cardiac silhouette. Electronically Signed   By: Bretta Bang III M.D.   On: 03/17/2017 07:35   Dg Chest Port 1 View  Result Date: 03/16/2017 CLINICAL DATA:  Respiratory failure. EXAM: PORTABLE CHEST 1 VIEW COMPARISON:  03/04/2017. FINDINGS: Endotracheal tube, NG tube, right IJ line stable position. Cardiomegaly with normal pulmonary vascularity. Bibasilar atelectasis/infiltrate again noted. Similar findings on prior exam. Tiny left pleural effusion cannot be excluded on today's exam. No pneumothorax. IMPRESSION: 1.  Lines and tubes in stable position. 2. Low lung volumes with bibasilar atelectasis/infiltrates again noted. No significant interim change. Tiny left pleural effusion cannot be excluded on today's exam. Electronically Signed   By: Maisie Fus  Register   On: 03/16/2017 06:39    Microbiology No results found for this or any previous visit (from the past 240 hour(s)).  Lab Basic Metabolic Panel: No results for input(s): NA, K, CL, CO2, GLUCOSE, BUN, CREATININE, CALCIUM, MG, PHOS in the last 168 hours. Liver Function Tests: No results for input(s): AST, ALT, ALKPHOS, BILITOT, PROT, ALBUMIN in the last 168 hours. No results for input(s): LIPASE, AMYLASE in the last 168 hours. No results for input(s): AMMONIA in the last 168 hours. CBC: No results for input(s): WBC, NEUTROABS, HGB, HCT, MCV, PLT in the last 168 hours. Cardiac Enzymes: No results for input(s): CKTOTAL, CKMB, CKMBINDEX, TROPONINI in the last 168 hours. Sepsis Labs: No results for input(s): PROCALCITON, WBC, LATICACIDVEN in the last 168 hours.  Procedures/Operations      Enedina Finner 04/13/2017, 10:18 AM

## 2018-11-24 IMAGING — CT CT HEAD W/O CM
3 series · 14 of 47 positions shown, 16 images · non-contrast
Comparison: Head CT dated 03/10/2017

CLINICAL DATA: 36-year-old male with seizure.

EXAM:
CT HEAD WITHOUT CONTRAST
TECHNIQUE: Contiguous axial images were obtained from the base of the skull
through the vertex without intravenous contrast.

[Series 2: head wo · axial · 0.45mm/px · z∈[+355,+485]mm · 8 of 32 slices shown, 10 images]
[im 3/32  brain]
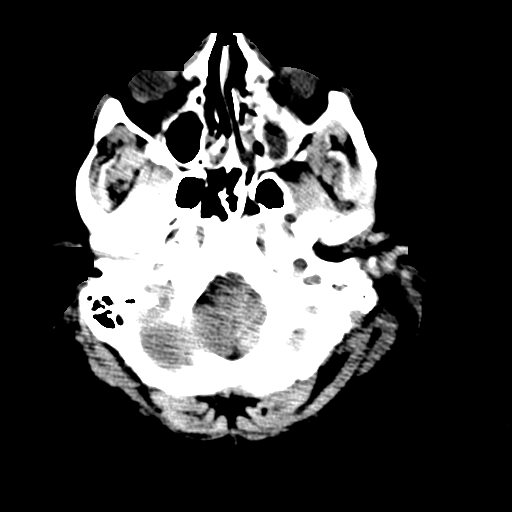
[im 3/32  bone]
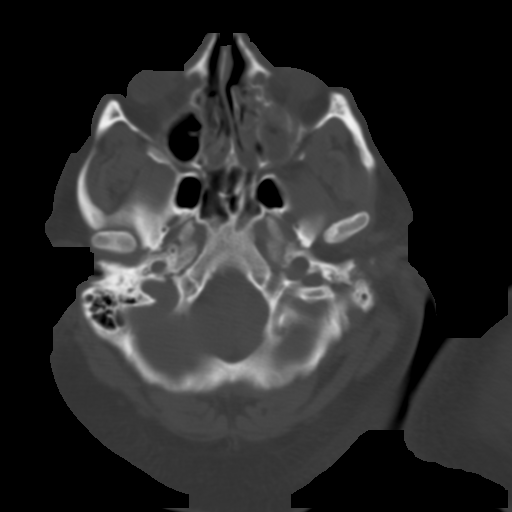
[im 7/32  brain]
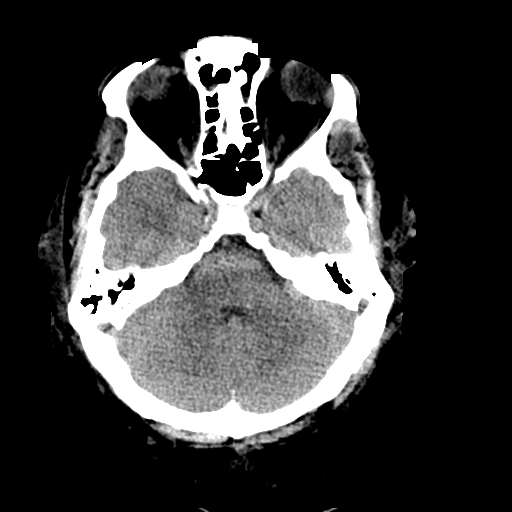
[im 10/32  brain]
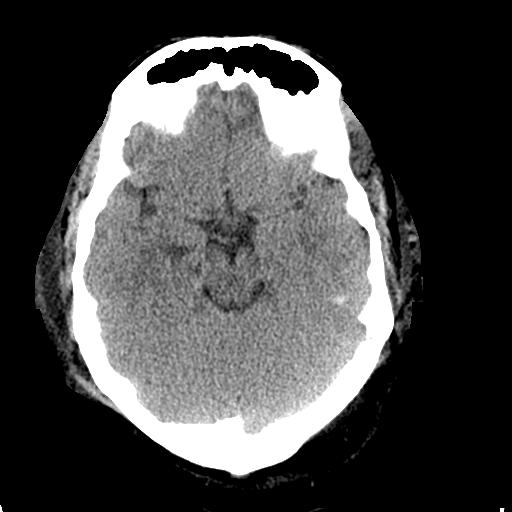
[im 14/32  brain]
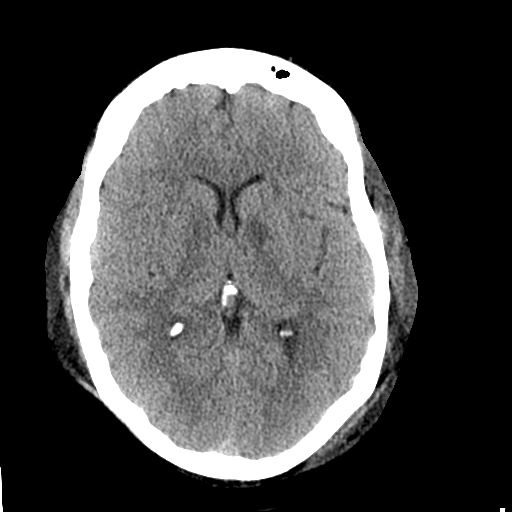
[im 18/32  brain]
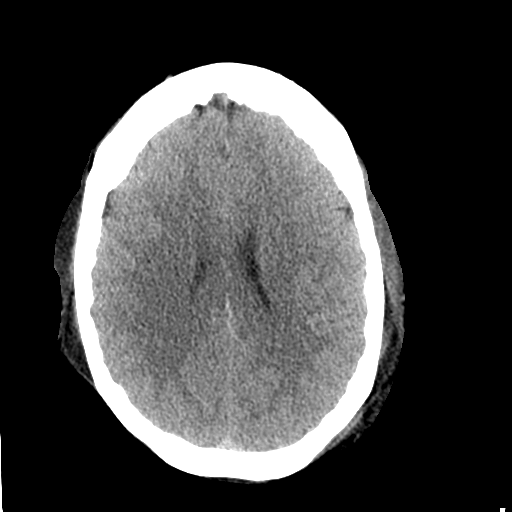
[im 18/32  bone]
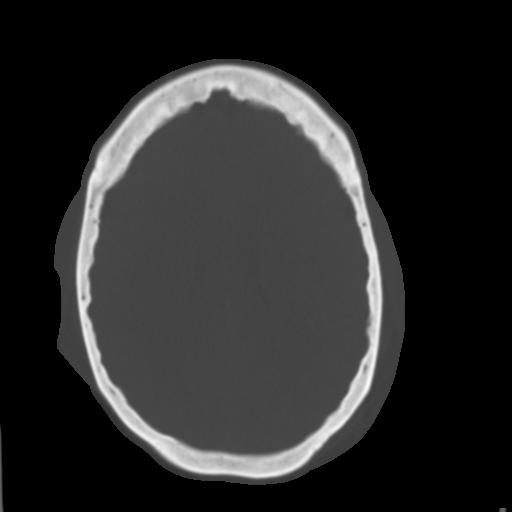
[im 22/32  brain]
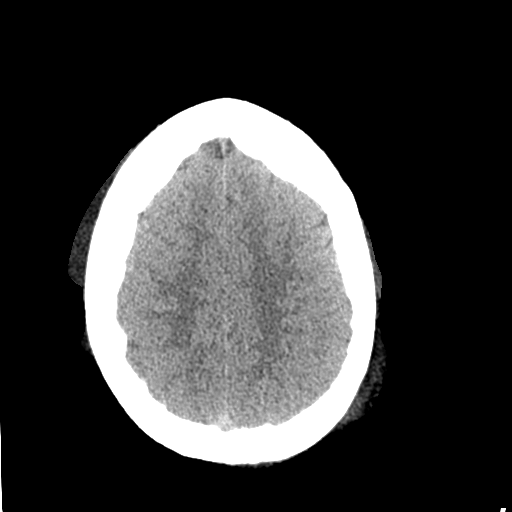
[im 25/32  brain]
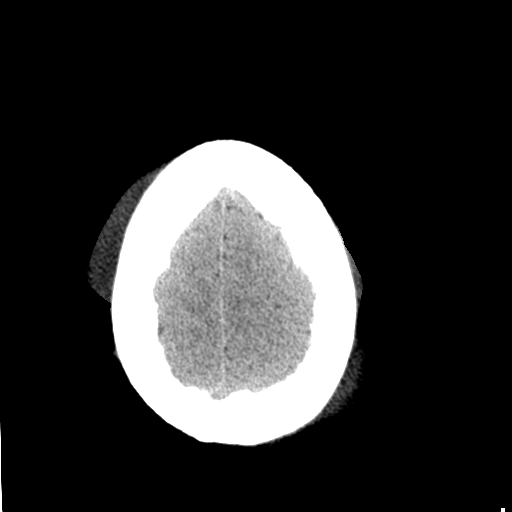
[im 29/32  brain]
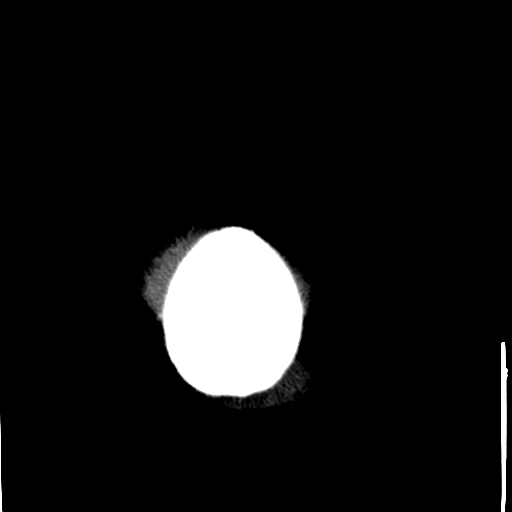

[Series 4: coronal soft tissue · coronal · 0.30mm/px · 3 of 70 slices shown]
[im 24/70  brain]
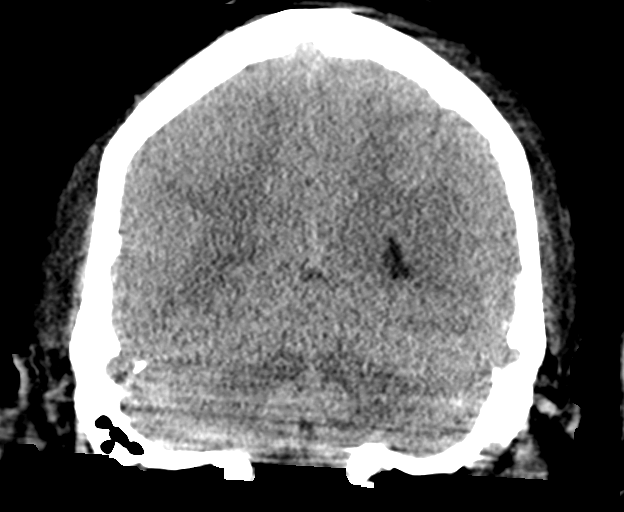
[im 31/70  brain]
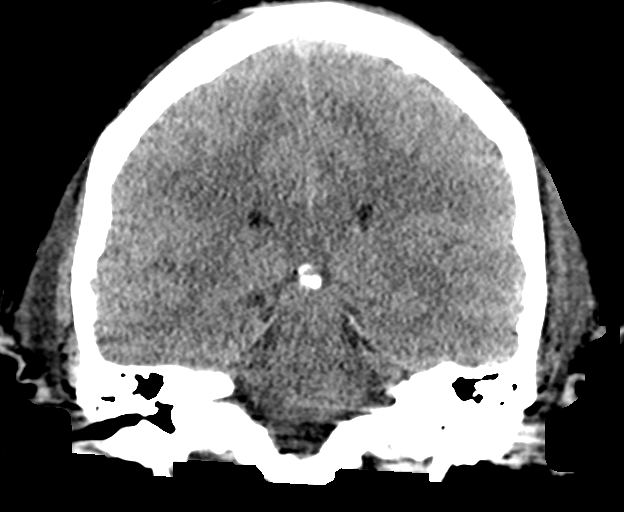
[im 39/70  brain]
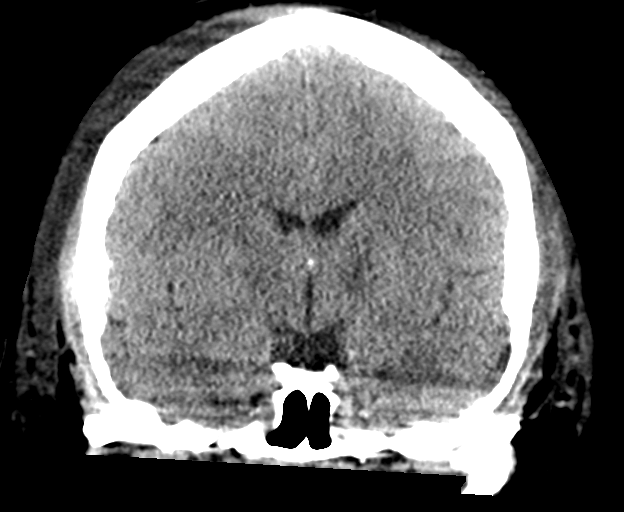

[Series 5: sagittal soft tissue · sagittal · 0.30mm/px · 3 of 55 slices shown]
[im 19/55  brain]
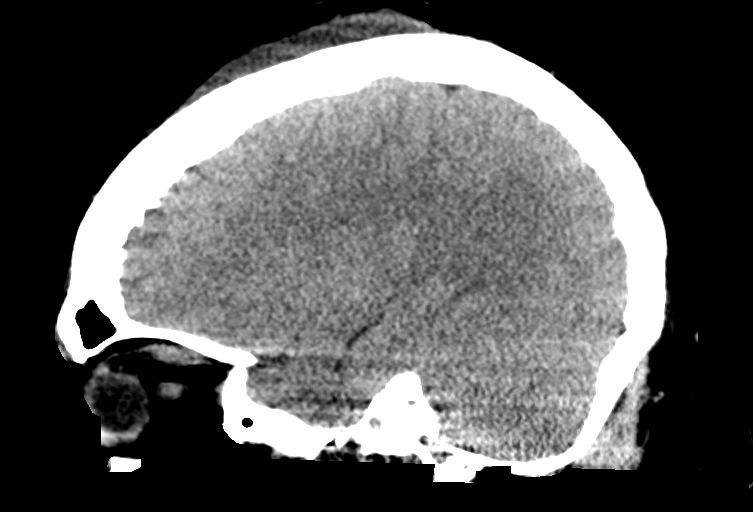
[im 28/55  brain]
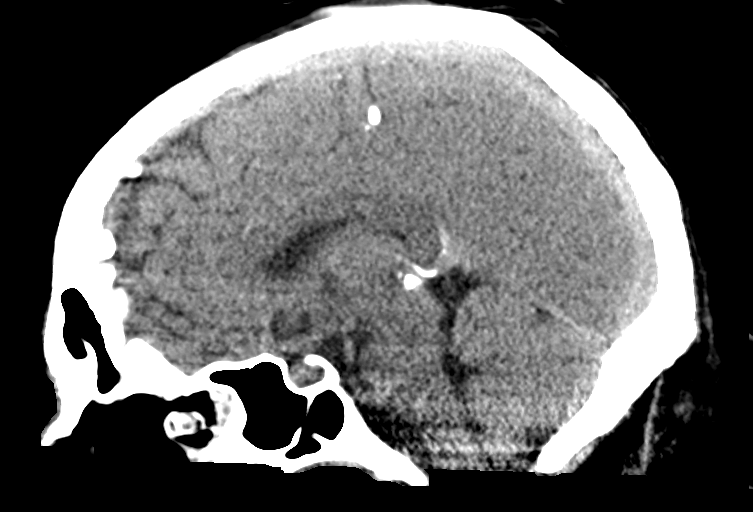
[im 37/55  brain]
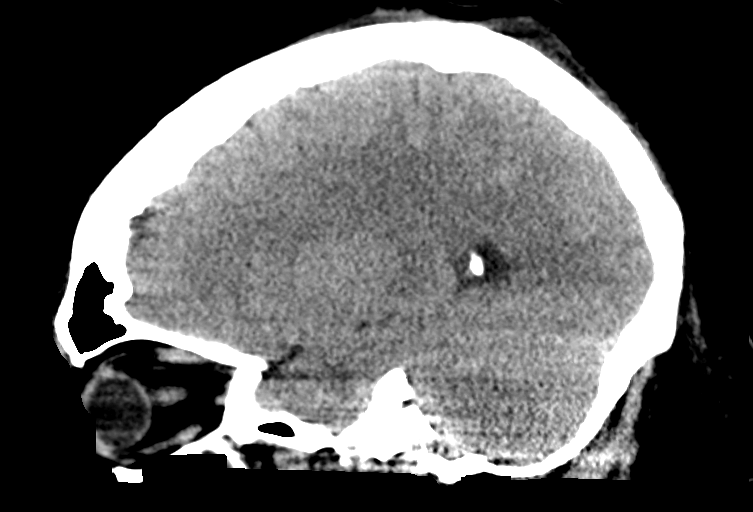

[14 of 47 positions shown; findings below may reference images not displayed]

FINDINGS: Brain: The ventricles and sulci appropriate size for patient's age.
Focal areas of low attenuation medial to the lentiform nuclei and in
the region of the internal capsule bilaterally appear new compared
to the prior CT study. Further evaluation with MRI is recommended.
There is no acute intracranial hemorrhage. No mass effect or midline
shift. No extra-axial fluid collection.

Vascular: No hyperdense vessel or unexpected calcification.

Skull: Normal. Negative for fracture or focal lesion.

Sinuses/Orbits: There is diffuse mucoperiosteal thickening of
paranasal sinuses with opacification of the multiple ethmoid air
cells and left maxillary sinus. The mastoid air cells are clear.

Other: Diffuse scalp edema or serosanguineous fluid. Clinical
correlation is recommended.
IMPRESSION: 1. Focal areas of low attenuation in the internal capsule medial to
the lentiform nuclei bilaterally, new compared to the prior CT and
may represent ischemia. Further evaluation with MRI recommended.
2. No acute intracranial hemorrhage.
3. Paranasal sinus disease.
4. Diffuse scalp edema.

These results were called by telephone at the time of interpretation
who verbally acknowledged these results.

## 2018-11-24 IMAGING — DX DG ABDOMEN 1V
1 series · 1 of 1 positions shown · non-contrast
Comparison: 03/11/2017.

CLINICAL DATA: OG tube placement.

EXAM:
ABDOMEN - 1 VIEW

[abdomen kub]
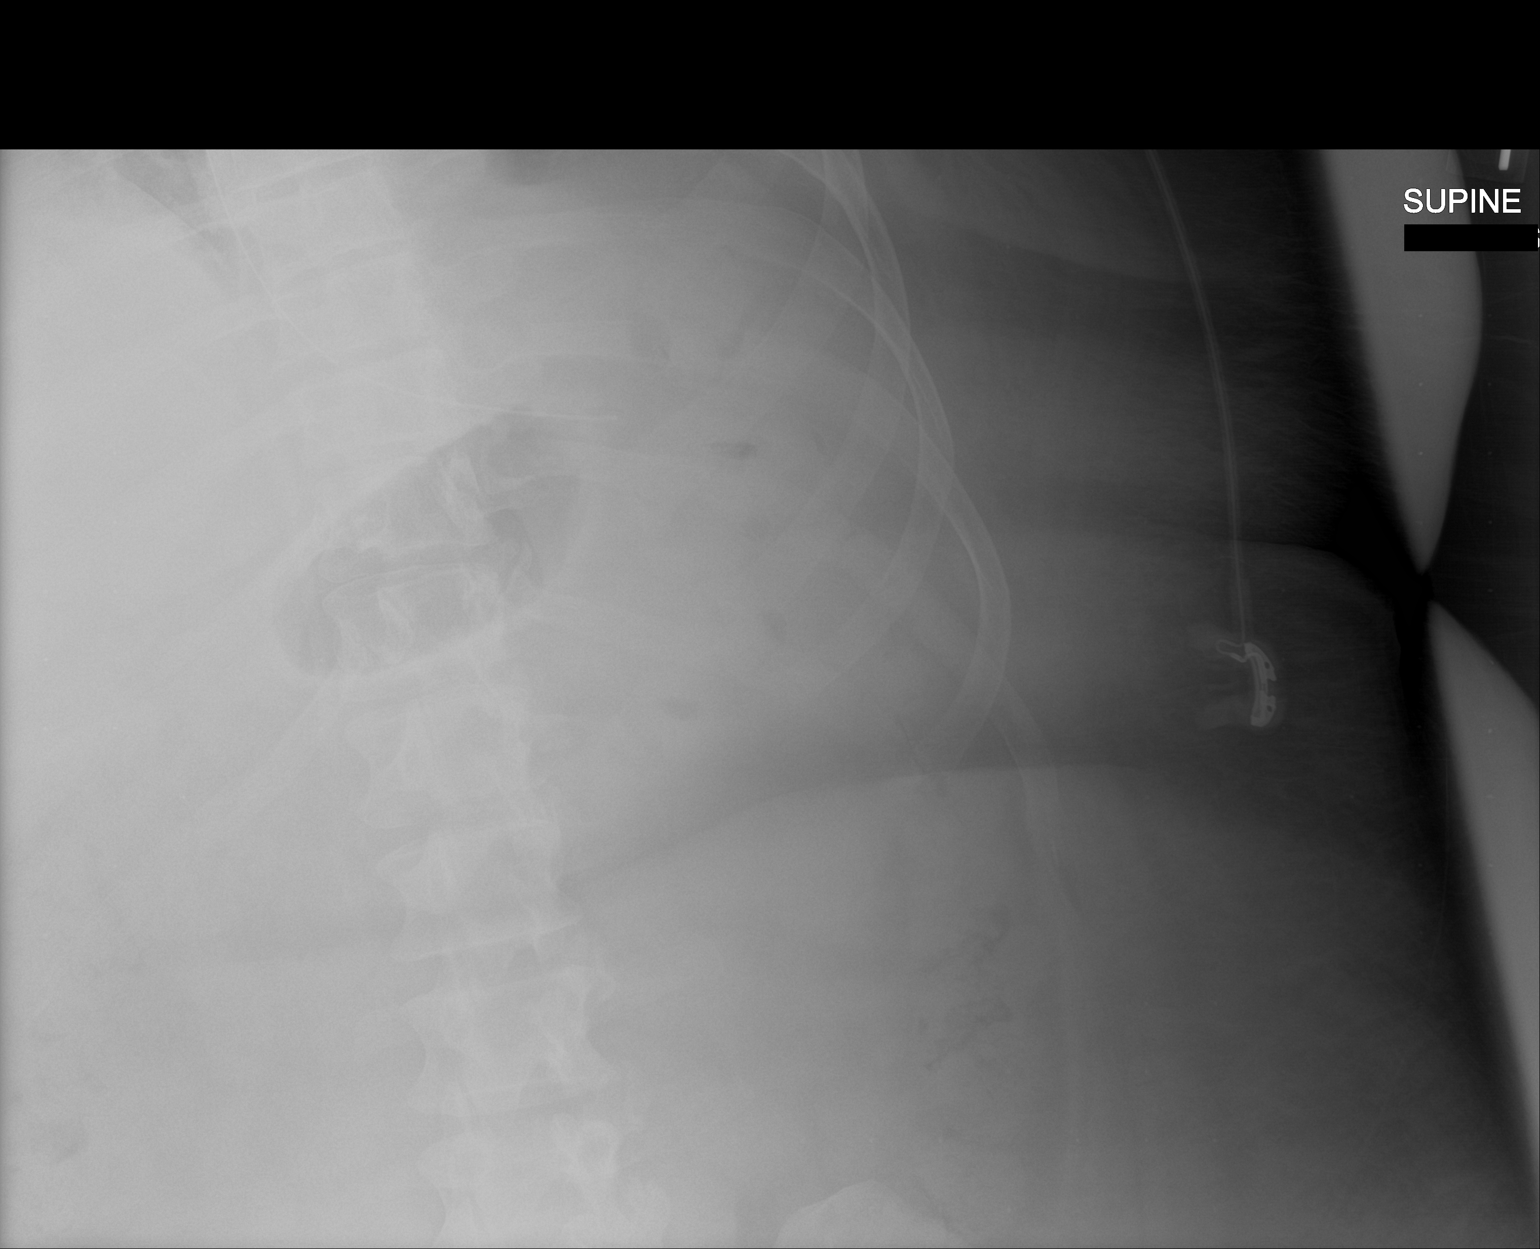

[1 of 1 positions shown; findings below may reference images not displayed]

FINDINGS: OG tube noted with tip below left hemidiaphragm. No bowel
distention.
IMPRESSION: OG tube noted with tip below left hemidiaphragm.

## 2018-11-26 IMAGING — DX DG CHEST 1V PORT
1 series · 1 of 1 positions shown · non-contrast
Comparison: 03/12/2017

CLINICAL DATA: Ventilator

EXAM:
PORTABLE CHEST 1 VIEW

[chest ap]
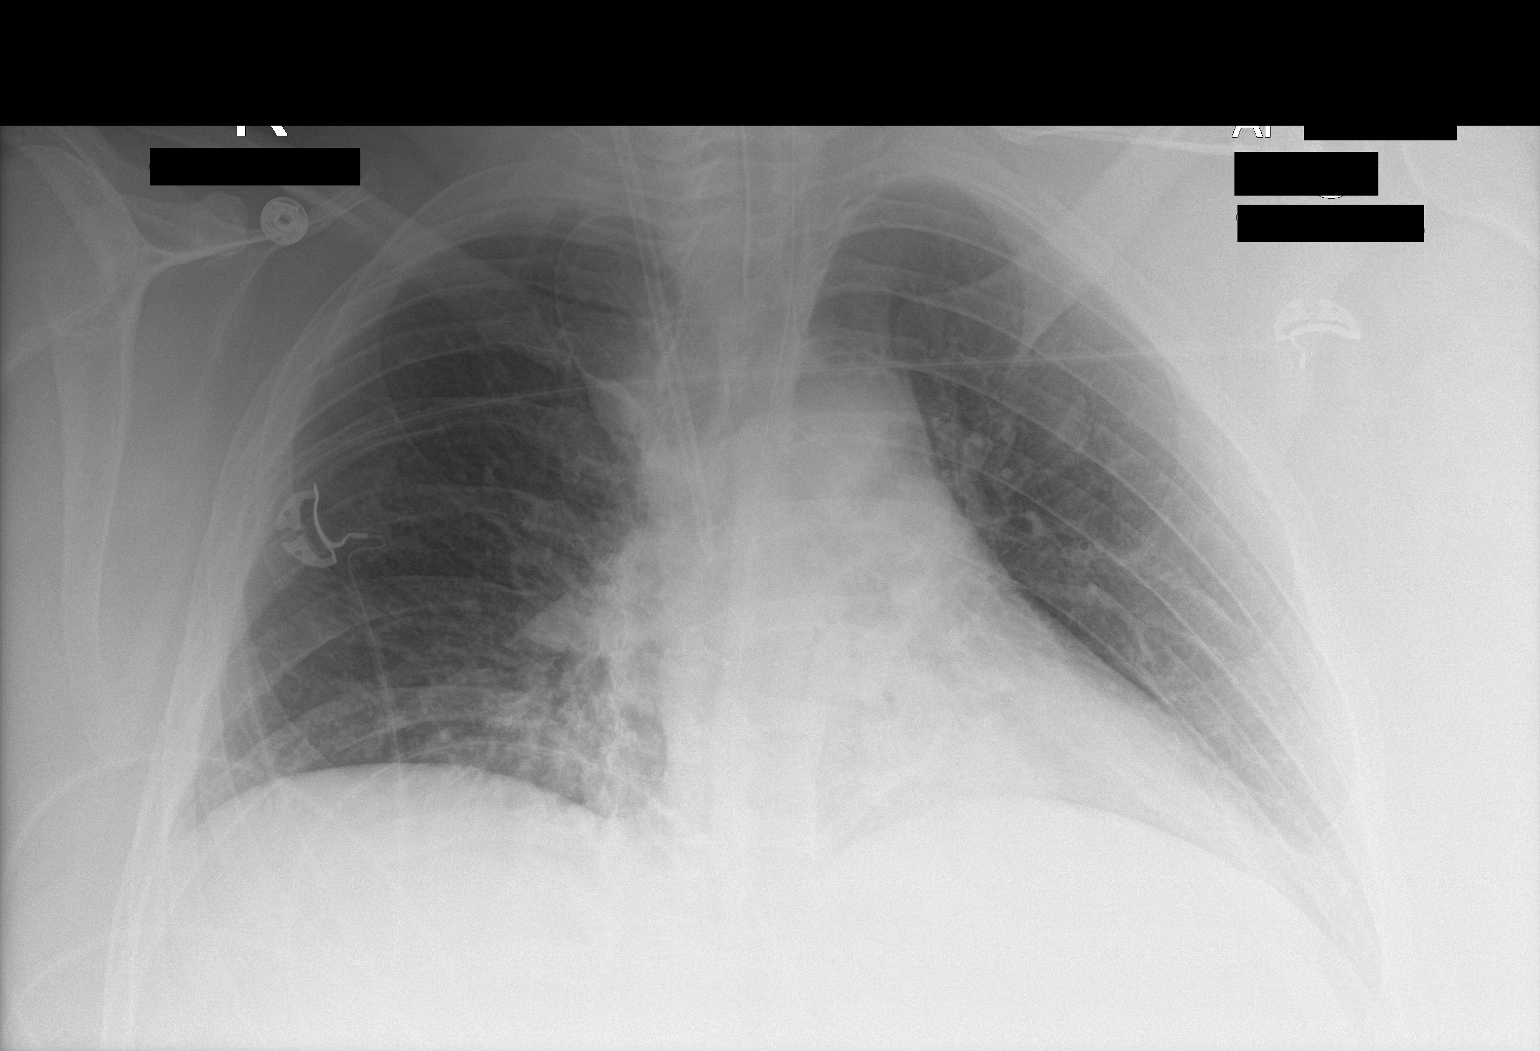

[1 of 1 positions shown; findings below may reference images not displayed]

FINDINGS: Low lung volumes with bibasilar atelectasis. Endotracheal tube,
right central line are unchanged. Interval placement of NG tube into
the stomach. Heart is borderline in size. No effusions.
IMPRESSION: Low lung volumes, bibasilar atelectasis.

## 2018-11-28 IMAGING — DX DG CHEST 1V PORT
1 series · 1 of 1 positions shown · non-contrast
Comparison: 03/04/2017.

CLINICAL DATA: Respiratory failure.

EXAM:
PORTABLE CHEST 1 VIEW

[chest ap]
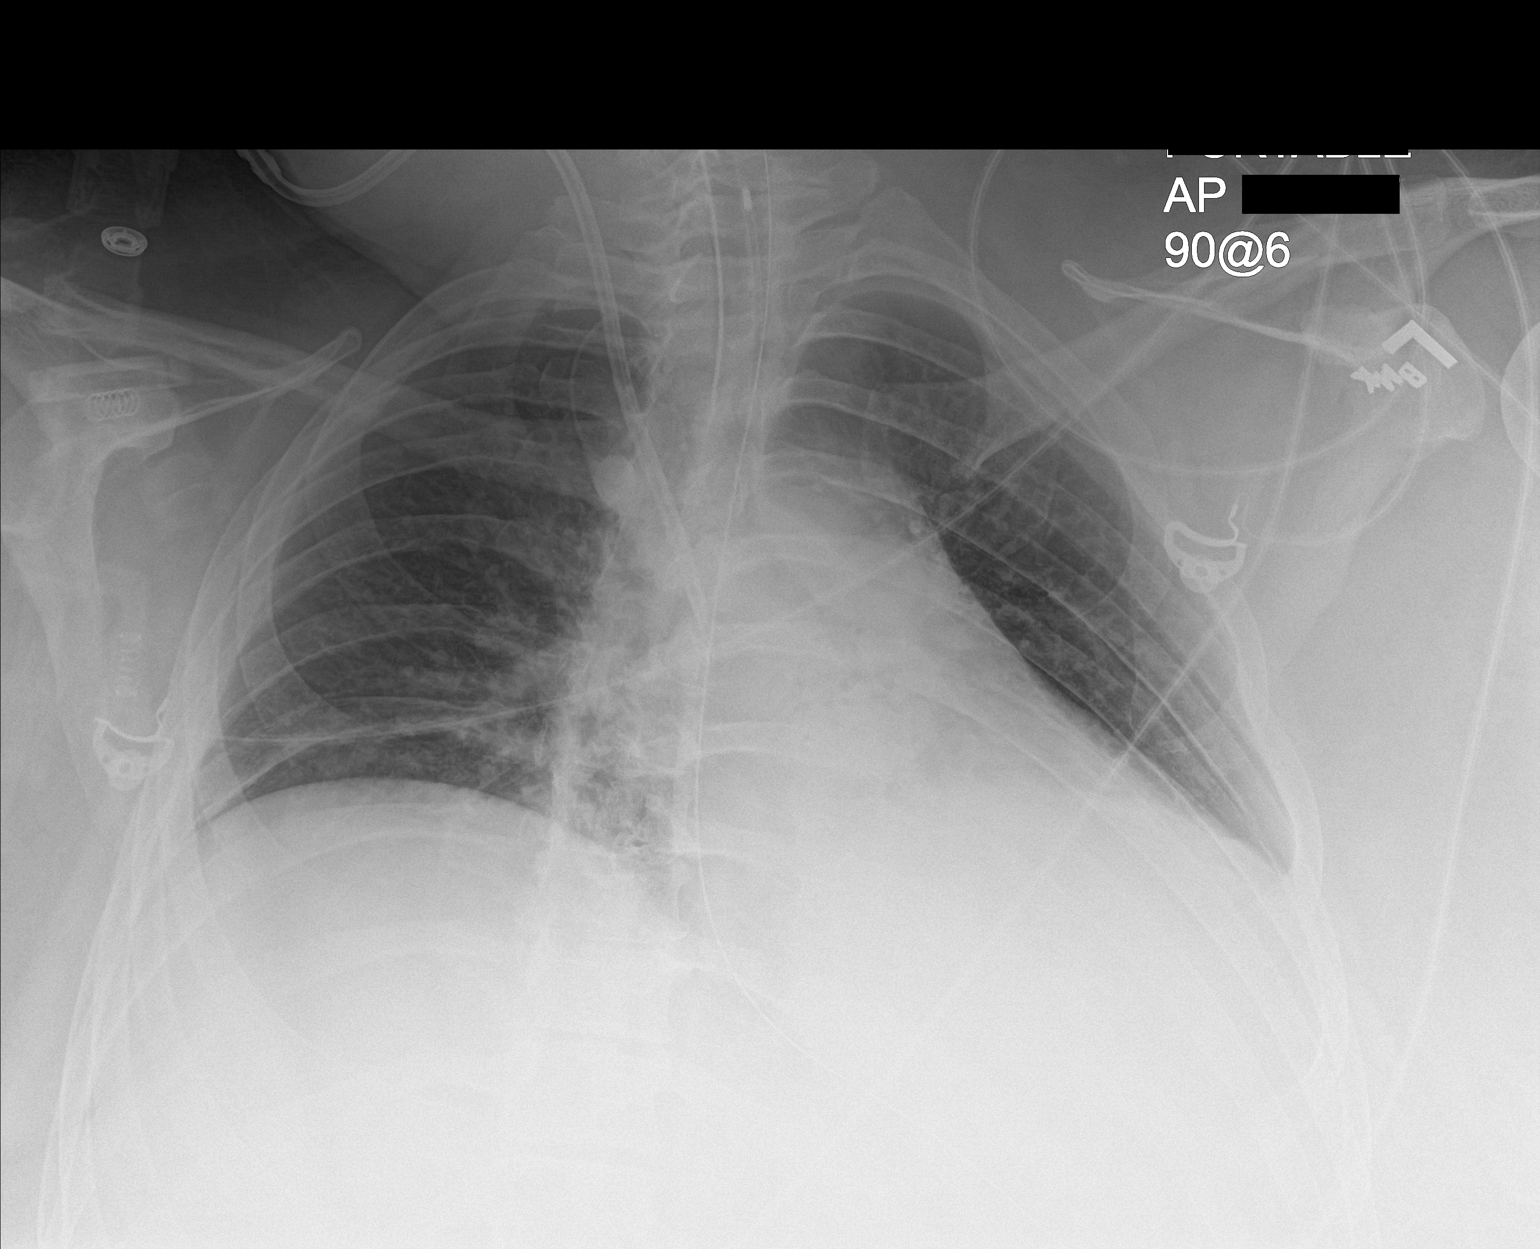

[1 of 1 positions shown; findings below may reference images not displayed]

FINDINGS: Endotracheal tube, NG tube, right IJ line stable position.
Cardiomegaly with normal pulmonary vascularity. Bibasilar
atelectasis/infiltrate again noted. Similar findings on prior exam.
Tiny left pleural effusion cannot be excluded on today's exam. No
pneumothorax.
IMPRESSION: 1.  Lines and tubes in stable position.

2. Low lung volumes with bibasilar atelectasis/infiltrates again
noted. No significant interim change. Tiny left pleural effusion
cannot be excluded on today's exam.

## 2018-11-30 IMAGING — DX DG CHEST 1V PORT
1 series · 1 of 1 positions shown · non-contrast
Comparison: Single-view of the chest 03/17/2017 and 03/16/2017.

CLINICAL DATA: Acute respiratory failure.  Intubated patient.

EXAM:
PORTABLE CHEST 1 VIEW

[chest ap]
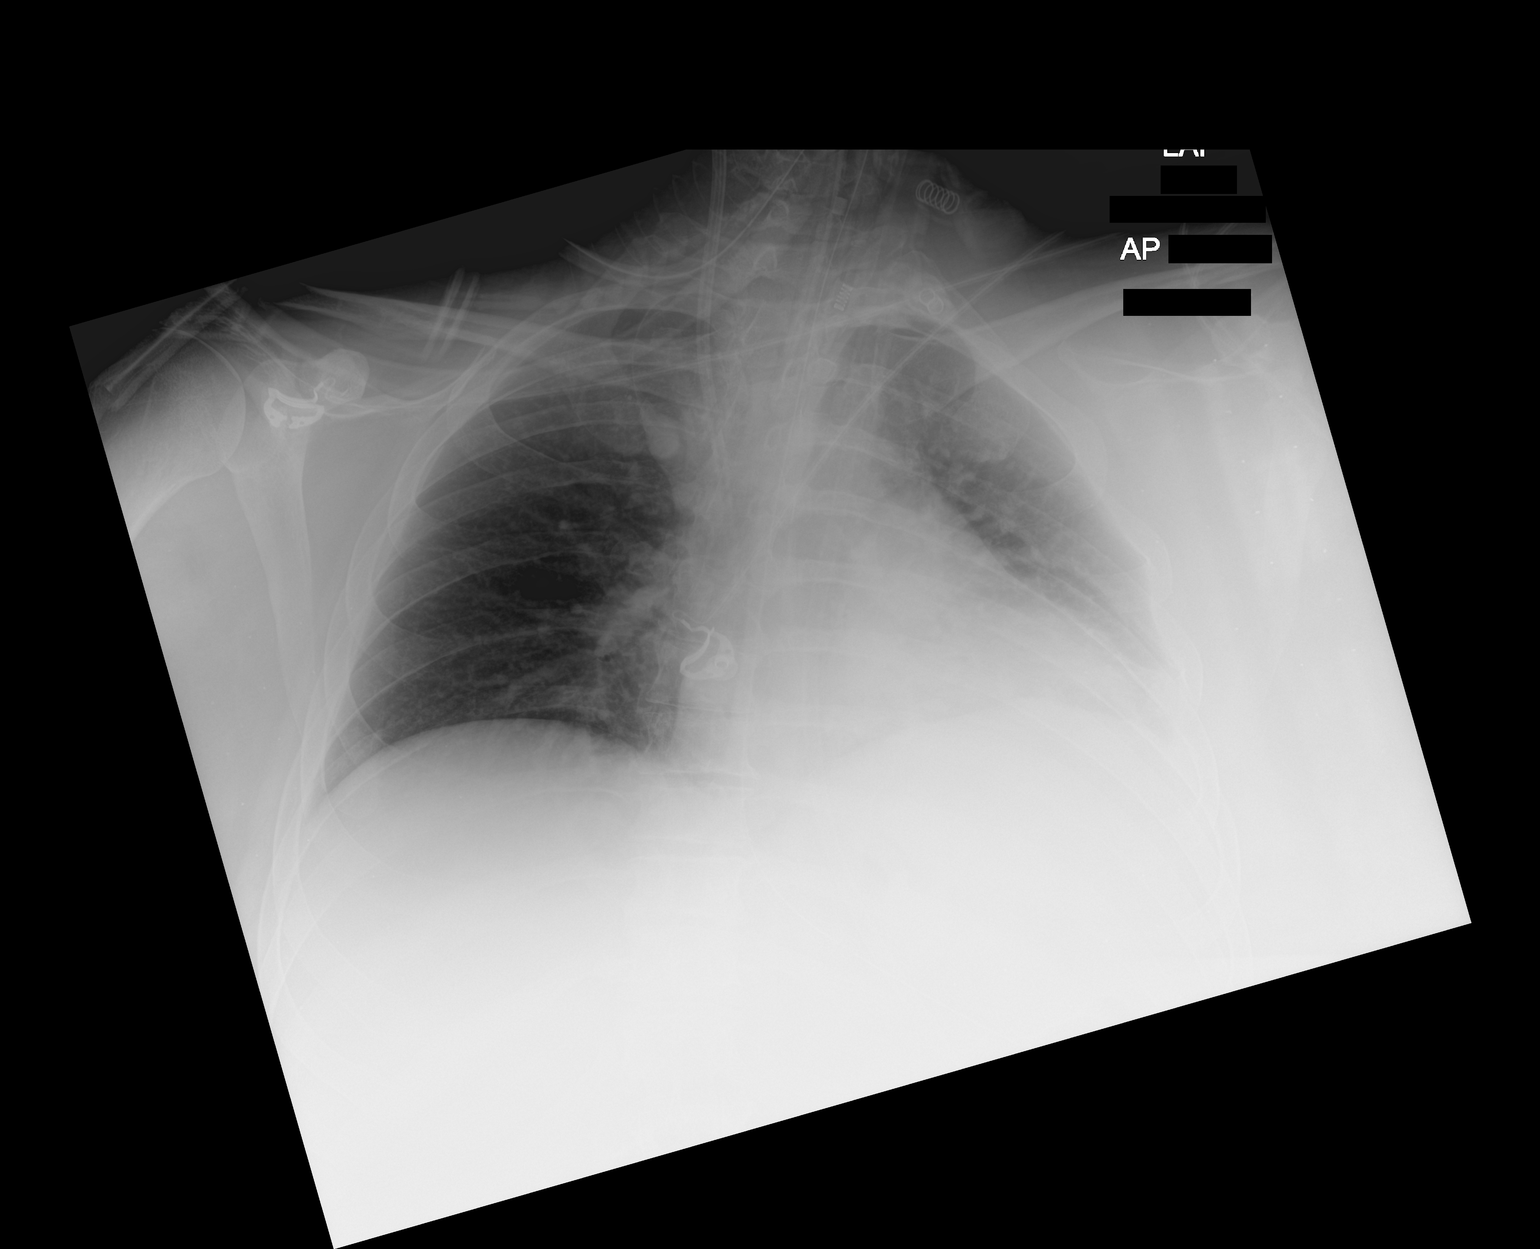

[1 of 1 positions shown; findings below may reference images not displayed]

FINDINGS: Support tubes and lines are unchanged. Left basilar airspace opacity
persists. Right lung is clear. Heart size is upper normal. No
pneumothorax.
IMPRESSION: Support tubes and lines are unchanged and project in good position.

No change in left basilar opacity.  No new abnormality.
